# Patient Record
Sex: Female | Born: 1980 | Race: White | Hispanic: No | Marital: Single | Smoking: Never smoker
Health system: Southern US, Community
[De-identification: ages and names within clinical notes are randomized; demographics above are authoritative.]

## PROBLEM LIST (undated history)

## (undated) DIAGNOSIS — J3089 Other allergic rhinitis: Secondary | ICD-10-CM

## (undated) DIAGNOSIS — J00 Acute nasopharyngitis [common cold]: Secondary | ICD-10-CM

## (undated) DIAGNOSIS — Z8489 Family history of other specified conditions: Secondary | ICD-10-CM

## (undated) DIAGNOSIS — Z87410 Personal history of cervical dysplasia: Secondary | ICD-10-CM

## (undated) DIAGNOSIS — F419 Anxiety disorder, unspecified: Secondary | ICD-10-CM

## (undated) DIAGNOSIS — N943 Premenstrual tension syndrome: Secondary | ICD-10-CM

## (undated) DIAGNOSIS — Z87898 Personal history of other specified conditions: Secondary | ICD-10-CM

## (undated) DIAGNOSIS — Z8742 Personal history of other diseases of the female genital tract: Secondary | ICD-10-CM

## (undated) DIAGNOSIS — N840 Polyp of corpus uteri: Secondary | ICD-10-CM

## (undated) DIAGNOSIS — G43909 Migraine, unspecified, not intractable, without status migrainosus: Secondary | ICD-10-CM

## (undated) HISTORY — DX: Migraine, unspecified, not intractable, without status migrainosus: G43.909

## (undated) HISTORY — DX: Premenstrual tension syndrome: N94.3

## (undated) HISTORY — PX: AUGMENTATION MAMMAPLASTY: SUR837

## (undated) HISTORY — PX: WISDOM TOOTH EXTRACTION: SHX21

## (undated) HISTORY — PX: HAMMER TOE SURGERY: SHX385

---

## 1999-04-02 ENCOUNTER — Other Ambulatory Visit: Admission: RE | Admit: 1999-04-02 | Discharge: 1999-04-02 | Payer: Self-pay | Admitting: *Deleted

## 2000-04-03 ENCOUNTER — Other Ambulatory Visit: Admission: RE | Admit: 2000-04-03 | Discharge: 2000-04-03 | Payer: Self-pay | Admitting: *Deleted

## 2001-04-06 ENCOUNTER — Other Ambulatory Visit: Admission: RE | Admit: 2001-04-06 | Discharge: 2001-04-06 | Payer: Self-pay | Admitting: Obstetrics and Gynecology

## 2002-01-14 ENCOUNTER — Other Ambulatory Visit: Admission: RE | Admit: 2002-01-14 | Discharge: 2002-01-14 | Payer: Self-pay | Admitting: Obstetrics and Gynecology

## 2003-02-02 ENCOUNTER — Other Ambulatory Visit: Admission: RE | Admit: 2003-02-02 | Discharge: 2003-02-02 | Payer: Self-pay | Admitting: Obstetrics and Gynecology

## 2004-02-28 ENCOUNTER — Other Ambulatory Visit: Admission: RE | Admit: 2004-02-28 | Discharge: 2004-02-28 | Payer: Self-pay | Admitting: Obstetrics and Gynecology

## 2004-05-10 DIAGNOSIS — Z87898 Personal history of other specified conditions: Secondary | ICD-10-CM

## 2004-05-10 HISTORY — DX: Personal history of other specified conditions: Z87.898

## 2005-02-28 ENCOUNTER — Other Ambulatory Visit: Admission: RE | Admit: 2005-02-28 | Discharge: 2005-02-28 | Payer: Self-pay | Admitting: Obstetrics and Gynecology

## 2006-03-20 ENCOUNTER — Other Ambulatory Visit: Admission: RE | Admit: 2006-03-20 | Discharge: 2006-03-20 | Payer: Self-pay | Admitting: Obstetrics and Gynecology

## 2006-06-01 ENCOUNTER — Ambulatory Visit: Payer: Self-pay | Admitting: Internal Medicine

## 2006-06-08 ENCOUNTER — Ambulatory Visit: Payer: Self-pay | Admitting: Internal Medicine

## 2007-09-01 ENCOUNTER — Other Ambulatory Visit: Admission: RE | Admit: 2007-09-01 | Discharge: 2007-09-01 | Payer: Self-pay | Admitting: Obstetrics and Gynecology

## 2007-12-27 ENCOUNTER — Ambulatory Visit: Payer: Self-pay | Admitting: Internal Medicine

## 2007-12-27 LAB — CONVERTED CEMR LAB
Glucose, Urine, Semiquant: NEGATIVE
Ketones, urine, test strip: NEGATIVE
Specific Gravity, Urine: >=1.03
pH: 5.5

## 2007-12-29 LAB — CONVERTED CEMR LAB
Albumin: 3.9 g/dL (ref 3.5–5.2)
Basophils Absolute: 0 10*3/uL (ref 0.0–0.1)
Cholesterol: 177 mg/dL (ref 0–200)
Creatinine, Ser: 1.1 mg/dL (ref 0.4–1.2)
GFR calc Af Amer: 77 mL/min
HCT: 40.3 % (ref 36.0–46.0)
HDL: 60.3 mg/dL (ref >39.0)
Hemoglobin: 13.7 g/dL (ref 12.0–15.0)
LDL Cholesterol: 100 mg/dL — ABNORMAL HIGH (ref 0–99)
Lymphocytes Relative: 44.5 % (ref 12.0–46.0)
MCHC: 34.1 g/dL (ref 30.0–36.0)
MCV: 94.6 fL (ref 78.0–100.0)
Monocytes Absolute: 0.5 10*3/uL (ref 0.2–0.7)
Monocytes Relative: 6.7 % (ref 3.0–11.0)
Neutro Abs: 3.4 10*3/uL (ref 1.4–7.7)
Neutrophils Relative %: 46.1 % (ref 43.0–77.0)
Potassium: 3.8 meq/L (ref 3.5–5.1)
RDW: 12 % (ref 11.5–14.6)
Sodium: 138 meq/L (ref 135–145)
TSH: 1.5 microintl units/mL (ref 0.35–5.50)
Total Bilirubin: 1 mg/dL (ref 0.3–1.2)
Total Protein: 6.7 g/dL (ref 6.0–8.3)

## 2007-12-31 ENCOUNTER — Ambulatory Visit: Payer: Self-pay | Admitting: Internal Medicine

## 2007-12-31 DIAGNOSIS — K6289 Other specified diseases of anus and rectum: Secondary | ICD-10-CM

## 2007-12-31 DIAGNOSIS — J309 Allergic rhinitis, unspecified: Secondary | ICD-10-CM | POA: Insufficient documentation

## 2008-09-06 ENCOUNTER — Other Ambulatory Visit: Admission: RE | Admit: 2008-09-06 | Discharge: 2008-09-06 | Payer: Self-pay | Admitting: Obstetrics and Gynecology

## 2009-12-11 ENCOUNTER — Telehealth: Payer: Self-pay | Admitting: Internal Medicine

## 2009-12-12 ENCOUNTER — Ambulatory Visit: Payer: Self-pay | Admitting: Internal Medicine

## 2009-12-12 LAB — CONVERTED CEMR LAB
ALT: 18 units/L (ref 0–35)
Basophils Absolute: 0 10*3/uL (ref 0.0–0.1)
Bilirubin Urine: NEGATIVE
CO2: 27 meq/L (ref 19–32)
Calcium: 9.2 mg/dL (ref 8.4–10.5)
Eosinophils Absolute: 0.2 10*3/uL (ref 0.0–0.7)
GFR calc non Af Amer: 69.24 mL/min (ref >60)
HCT: 39.5 % (ref 36.0–46.0)
HDL: 70.2 mg/dL (ref >39.00)
Leukocytes, UA: NEGATIVE
Lymphs Abs: 2.3 10*3/uL (ref 0.7–4.0)
MCV: 97 fL (ref 78.0–100.0)
Monocytes Absolute: 0.4 10*3/uL (ref 0.1–1.0)
Nitrite: NEGATIVE
Platelets: 201 10*3/uL (ref 150.0–400.0)
Potassium: 4.2 meq/L (ref 3.5–5.1)
RDW: 11.4 % — ABNORMAL LOW (ref 11.5–14.6)
Sodium: 138 meq/L (ref 135–145)
Specific Gravity, Urine: 1.025 (ref 1.000–1.030)
TSH: 1.1 microintl units/mL (ref 0.35–5.50)
Total Bilirubin: 0.9 mg/dL (ref 0.3–1.2)
Triglycerides: 86 mg/dL (ref 0.0–149.0)
pH: 6 (ref 5.0–8.0)

## 2009-12-17 ENCOUNTER — Ambulatory Visit: Payer: Self-pay | Admitting: Internal Medicine

## 2009-12-17 DIAGNOSIS — J069 Acute upper respiratory infection, unspecified: Secondary | ICD-10-CM | POA: Insufficient documentation

## 2009-12-21 ENCOUNTER — Ambulatory Visit: Payer: Self-pay | Admitting: Internal Medicine

## 2009-12-24 ENCOUNTER — Ambulatory Visit: Payer: Self-pay | Admitting: Internal Medicine

## 2010-01-08 HISTORY — PX: COLPOSCOPY: SHX161

## 2010-01-14 ENCOUNTER — Ambulatory Visit: Payer: Self-pay | Admitting: Internal Medicine

## 2010-06-14 ENCOUNTER — Ambulatory Visit: Payer: Self-pay | Admitting: Internal Medicine

## 2010-12-10 NOTE — Assessment & Plan Note (Signed)
Summary: hep A AND B COMBO/NJR  Nurse Visit   Allergies: 1)  ! * Shellfish  Immunizations Administered:  TwinRix # 2:    Vaccine Type: TwinRix    Site: right deltoid    Mfr: GlaxoSmithKline    Dose: 1.0 ml    Route: IM    Given by: Romualdo Bolk, CMA (AAMA)    Exp. Date: 08/25/2011    Lot #: [id number]  Orders Added: 1)  TwinRix 1ml ( Hep A&B Adult dose) [90636] 2)  Admin 1st Vaccine [90471]

## 2010-12-10 NOTE — Assessment & Plan Note (Signed)
Summary: tb reading/mm  Nurse Visit   Allergies: 1)  ! * Shellfish  PPD Results    Date of reading: 12/24/2009    Results: < 5mm    Interpretation: negative

## 2010-12-10 NOTE — Assessment & Plan Note (Signed)
Summary: final hep a/b//ccm  Nurse Visit   Allergies: 1)  ! * Shellfish  Immunizations Administered:  TwinRix # 3:    Vaccine Type: TwinRix    Site: right deltoid    Mfr: GlaxoSmithKline    Dose: 1.0 ml    Route: IM    Given by: Romualdo Bolk, CMA (AAMA)    Exp. Date: 08/16/2012    Lot #: [id number]  Orders Added: 1)  TwinRix 1ml ( Hep A&B Adult dose) [90636] 2)  Admin 1st Vaccine [90471]

## 2010-12-10 NOTE — Assessment & Plan Note (Signed)
Summary: ppd only/ssc  Nurse Visit   Allergies: 1)  ! * Shellfish  Immunizations Administered:  PPD Skin Test:    Vaccine Type: PPD    Site: right forearm    Mfr: Sanofi Pasteur    Dose: 0.1 ml    Route: ID    Given by: Romualdo Bolk, CMA (AAMA)    Exp. Date: 04/06/2012    Lot #: [id number]  Orders Added: 1)  TB Skin Test [86580] 2)  Admin 1st Vaccine [phone]
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
## 2010-12-10 NOTE — Progress Notes (Signed)
Summary: REQ FOR LABS  Phone Note Call from Patient   Caller: Patient Reason for Call: Talk to Doctor Summary of Call: Pt called to schedule appt for cpx w/ Dr Fabian Sharp on 12/17/2009 @ 10am.... Pt will go for labs at [facility] on 12/12/2009.... However, pt is req to have her Vitamin D level ck at time of her cpx labs.... Can you advise order for vit d ck to be added to cpx labs?  Initial call taken by: Debbra Riding,  December 11, 2009 12:25 PM  Follow-up for Phone Call        Per Dr. Fabian Sharp- can do but Ins may not pay for it. Follow-up by: Romualdo Bolk, CMA Duncan Dull),  December 11, 2009 5:23 PM  Additional Follow-up for Phone Call Additional follow up Details #1::        Phone Call Completed----Pt was called on [phone] and adv of same. Additional Follow-up by: Debbra Riding,  December 12, 2009 8:03 AM

## 2011-03-28 NOTE — Assessment & Plan Note (Signed)
[facility] OFFICE NOTE   [name], [name]                     MRN:          [id number]  DATE:06/08/2006                            DOB:          1981-08-09    CHIEF COMPLAINT:  New patient checkup.   HISTORY OF PRESENT ILLNESS:  [name] is a 30 year old, nonsmoking,  single white female who comes in today for the above reason.  She was seen  in our practice quite awhile ago and is reestablishing with a primary care  physician.  She does see OB/GYN Dr. Meredeth Ide and had a checkup in May  2007. She is generally well but does have allergic symptoms such as allergic  rhinitis, occasional headaches related to that and is on allergy shots for  this.  She sees Dr. Stevphen Rochester.  She has also had some GERD symptoms for  which she has taken vinegar b.i.d.  No other treatment, prescription or  otherwise.  Notes it more after eating three to five days a week.  No  nocturnal symptoms, vomiting, weight loss, or other reflux symptoms.   PAST MEDICAL HISTORY:  See data base.  1.  Chicken pox has a child.  2.  History of anemia.  3.  Seasonal rhinitis.  4.  GERD as above.  5.  Hospitalizations:  None.  6.  Last Pap smear was May 2007.  7.  LMP June 06, 2006.  8.  She thinks her tetanus shot is up to date because she was at nursing      school. Cannot give the dates.   MEDICATIONS:  1.  Zyrtec 10 mg a day.  2.  __________  28 one p.o. daily.  3.  Allergy shots.   ALLERGIES:  She is allergic to a number of things and mildly to shellfish.  No known medication allergies.   FAMILY HISTORY:  Father with type 2 diabetes as well as paternal aunt.  Father also has hypertension and hyperlipidemia.  There is also arthritis in  the family.  See data base.   SOCIAL HISTORY:  Employed as a Designer, television/film set.  BS degree.  __________  .  Eight hours of sleep a night.  Social alcohol up to six beverages a  week.  No tobacco.  Tries to do regular  exercises but not as much recently.  Caffeine: Three diet sodas a day or  such.   REVIEW OF SYSTEMS:  Negative for chest pain, shortness of breath.  She does  have some problems with headaches premenstrually and takes four Advil for  this but not on a regular basis.  The rest are review of systems is negative  or noncontributory.   PHYSICAL EXAMINATION:  VITAL SIGNS:  Height 5 feet 8 inches, weight 171.  Pulse 72 and regular, blood pressure 100/77.  GENERAL:  Well-nourished, healthy-appearing, young adult in no acute  distress.  HEENT:  Normocephalic.  Tympanic membrane clear.  Eyes: PERRL.  Sclerae  nonicteric.  She is wearing contacts.  Oropharynx clear.  NECK:  Supple without masses, thyromegaly or bruits.  CHEST:  Clear to auscultation.Marland Kitchen  CARDIAC:  S1 and S2.  No gallops, murmurs.  Peripheral pulses present.  __________ .  negative CPE.  LYMPH NODES:  Negative axilla, groin and cervix.  ABDOMEN:  Soft.  No organomegaly, guarding or rebound.  EXTREMITIES:  Good range of motion.  BACK:  Without scoliosis.  NEUROLOGIC:  Grossly intact.   LABORATORY DATA:  Normal CBC with a slight flip of leukocytes and  neutrophils 41/48.  Lipids normal.  Total 163.  CMP normal.  Fasting blood  sugar 88, creatinine 1, TSH 0.97.  Urinalysis is clear.   IMPRESSION:  1.  __________  healthy.  2.  Gastroesophageal reflux disease, somewhat more symptomatic than      occasional.  Counseled and discussed lifestyle changes and decreasing      caffeine.  Add on ranitidine 150 one p.o. b.i.d. #60, refill x6 and      __________  in about one month.  If still problematic, may consider      proton pump inhibitor. She may be able to control this with lifestyle      and weight loss of 35 pounds or such.   Also followup p.r.n. for other issues as needed.                                   Neta Mends. Fabian Sharp, MD   WKP/MedQ  DD:  06/08/2006  DT:  06/09/2006  Job #:   [id number]

## 2011-10-22 ENCOUNTER — Ambulatory Visit (INDEPENDENT_AMBULATORY_CARE_PROVIDER_SITE_OTHER): Payer: BC Managed Care – PPO

## 2011-10-22 DIAGNOSIS — R05 Cough: Secondary | ICD-10-CM

## 2011-10-22 DIAGNOSIS — J111 Influenza due to unidentified influenza virus with other respiratory manifestations: Secondary | ICD-10-CM

## 2012-01-09 HISTORY — PX: COLPOSCOPY: SHX161

## 2013-03-23 ENCOUNTER — Other Ambulatory Visit: Payer: Self-pay | Admitting: Certified Nurse Midwife

## 2013-03-28 ENCOUNTER — Encounter: Payer: Self-pay | Admitting: Obstetrics and Gynecology

## 2013-03-28 ENCOUNTER — Encounter: Payer: Self-pay | Admitting: Internal Medicine

## 2013-03-29 ENCOUNTER — Ambulatory Visit (INDEPENDENT_AMBULATORY_CARE_PROVIDER_SITE_OTHER): Payer: BC Managed Care – PPO | Admitting: Obstetrics and Gynecology

## 2013-03-29 ENCOUNTER — Encounter: Payer: Self-pay | Admitting: Obstetrics and Gynecology

## 2013-03-29 VITALS — BP 120/78 | Ht 68.0 in | Wt 177.0 lb

## 2013-03-29 DIAGNOSIS — Z01419 Encounter for gynecological examination (general) (routine) without abnormal findings: Secondary | ICD-10-CM

## 2013-03-29 DIAGNOSIS — Z0000 Encounter for general adult medical examination without abnormal findings: Secondary | ICD-10-CM

## 2013-03-29 LAB — POCT URINALYSIS DIPSTICK
Leukocytes, UA: NEGATIVE
Nitrite, UA: NEGATIVE
Protein, UA: NEGATIVE
Urobilinogen, UA: NEGATIVE
pH, UA: 7

## 2013-03-29 MED ORDER — LEVONORGESTREL-ETHINYL ESTRAD 0.1-20 MG-MCG PO TABS: ORAL_TABLET | ORAL | Status: DC
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
## 2013-03-29 NOTE — Patient Instructions (Signed)

## 2013-03-30 LAB — HEMOGLOBIN, FINGERSTICK: Hemoglobin, fingerstick: 15.4 g/dL (ref 12.0–16.0)

## 2013-06-10 HISTORY — PX: BREAST ENHANCEMENT SURGERY: SHX7

## 2014-01-30 ENCOUNTER — Ambulatory Visit (INDEPENDENT_AMBULATORY_CARE_PROVIDER_SITE_OTHER): Payer: BC Managed Care – PPO | Admitting: Physician Assistant

## 2014-01-30 VITALS — BP 116/72 | HR 73 | Temp 97.9°F | Resp 16 | Ht 68.5 in | Wt 186.0 lb

## 2014-01-30 DIAGNOSIS — J309 Allergic rhinitis, unspecified: Secondary | ICD-10-CM

## 2014-01-30 DIAGNOSIS — J302 Other seasonal allergic rhinitis: Secondary | ICD-10-CM

## 2014-01-30 MED ORDER — METHYLPREDNISOLONE ACETATE 80 MG/ML IJ SUSP
80.0000 mg | Freq: Once | INTRAMUSCULAR | Status: AC
  Administered 2014-01-30: 80 mg via INTRAMUSCULAR

## 2014-01-30 MED ORDER — MOMETASONE FUROATE 50 MCG/ACT NA SUSP: 2.0000 | Freq: Every day | NASAL | Status: DC

## 2014-01-30 NOTE — Progress Notes (Signed)
   Subjective:    Patient ID: [name], female    DOB: Nov 09, 1981, 33 y.o.   MRN: [id number]  HPI Pt presents to clinic with allergies that flared over the weekend and now she cannot breath out of her nose.  She has h/o allergy injections but has not had them for years.  She has used a few doses of OTC antihistamines and afrin since her symptoms started.  She is worried because she is leaving for [facility] for the weekend and going to fly and wants to be able to breath.  This am she cough and her chest felt tight but that was only once and she has no h/o asthma.  She does not feel like this is a cold.  Review of Systems  Constitutional: Negative for fever and chills.  HENT: Positive for congestion and rhinorrhea (clear).   Respiratory: Positive for cough.        Objective:   Physical Exam  Vitals reviewed. Constitutional: She is oriented to person, place, and time. She appears well-developed and well-nourished.  HENT:  Head: Normocephalic and atraumatic.  Right Ear: Hearing, tympanic membrane, external ear and ear canal normal.  Left Ear: Hearing, tympanic membrane, external ear and ear canal normal.  Nose: Mucosal edema (pale and turbinates touching septum on the left side) present.  Mouth/Throat: Uvula is midline, oropharynx is clear and moist and mucous membranes are normal.  Very congested  Eyes: Conjunctivae are normal.  Neck: Normal range of motion.  Cardiovascular: Normal rate, regular rhythm and normal heart sounds.   No murmur heard. Pulmonary/Chest: Effort normal and breath sounds normal. She has no wheezes.  Neurological: She is alert and oriented to person, place, and time.  Skin: Skin is warm and dry.  Psychiatric: She has a normal mood and affect. Her behavior is normal. Judgment and thought content normal.       Assessment & Plan:  Seasonal allergies - Plan: methylPREDNISolone acetate (DEPO-MEDROL) injection 80 mg, mometasone (NASONEX) 50 MCG/ACT nasal spray   Pt to continue OTC antihistamines.  Use Afrin only a few days to prevent rebound congestion.  Windell Hummingbird PA-C  [facility] 01/30/2014 7:42 PM

## 2014-04-05 ENCOUNTER — Ambulatory Visit: Payer: BC Managed Care – PPO | Admitting: Obstetrics and Gynecology

## 2014-04-07 ENCOUNTER — Encounter: Payer: Self-pay | Admitting: Obstetrics & Gynecology

## 2014-04-07 ENCOUNTER — Ambulatory Visit (INDEPENDENT_AMBULATORY_CARE_PROVIDER_SITE_OTHER): Payer: BC Managed Care – PPO | Admitting: Obstetrics & Gynecology

## 2014-04-07 VITALS — BP 108/68 | HR 64 | Resp 18 | Ht 68.5 in | Wt 178.0 lb

## 2014-04-07 DIAGNOSIS — Z01419 Encounter for gynecological examination (general) (routine) without abnormal findings: Secondary | ICD-10-CM

## 2014-04-07 DIAGNOSIS — N9489 Other specified conditions associated with female genital organs and menstrual cycle: Secondary | ICD-10-CM

## 2014-04-07 DIAGNOSIS — N907 Vulvar cyst: Secondary | ICD-10-CM

## 2014-04-07 DIAGNOSIS — N870 Mild cervical dysplasia: Secondary | ICD-10-CM

## 2014-04-07 DIAGNOSIS — Z124 Encounter for screening for malignant neoplasm of cervix: Secondary | ICD-10-CM

## 2014-04-07 MED ORDER — LEVONORGESTREL-ETHINYL ESTRAD 0.1-20 MG-MCG PO TABS: ORAL_TABLET | ORAL | Status: DC
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
## 2014-04-11 LAB — IPS PAP TEST WITH HPV

## 2014-04-19 ENCOUNTER — Telehealth: Payer: Self-pay | Admitting: Obstetrics & Gynecology

## 2014-04-19 NOTE — Telephone Encounter (Signed)
Spoke with patient. Results given as seen below. Patient states "Now I do not have to come back for two years right?" Advised patient that we recommend AEX every year but may not have PAP done every year depending on previous results and doctor protocol. Patient would like to verify with Dr.Miller when she needs to be seen again. "Dr.Miller said that I would not need to be seen for two years." Advised patient would send a message to [place] and give patient a call back with further recommendations. Patient agreeable.   Notes Recorded by Maryann Conners, CMA on 04/17/2014 at 10:29 AM Patient has AEX scheduled for 2016//kn Notes Recorded by Lyman Speller, MD on 04/12/2014 at 8:13 AM 02 recall.. H/o CIN 2. Two normal paps with neg HR HPV.

## 2014-04-19 NOTE — Telephone Encounter (Signed)
She should be seen in a year.  She doesn't need a Pap next year.  Also, she is on OCPs and gets a yearly RX so she needs to come back.  Her appt was scheduled already.  You are correct in your recommendations to her.

## 2014-04-19 NOTE — Telephone Encounter (Signed)
Patient is calling for recent pap results. °

## 2014-04-19 NOTE — Telephone Encounter (Signed)
Detailed message left at number provided [phone]. Okay per ROI. Advised of message from [place] as seen below. Advised we already have her scheduled for next year and if any further questions to give [place] a call back at [phone].  Routing to provider for final review. Patient agreeable to disposition. Will close encounter

## 2014-06-19 ENCOUNTER — Telehealth: Payer: Self-pay | Admitting: Obstetrics & Gynecology

## 2014-06-19 NOTE — Telephone Encounter (Signed)
Patient calling to speak with nurse with questions about her birth control.

## 2014-06-19 NOTE — Telephone Encounter (Signed)
Spoke with patient. Patient states that she has been taking the generic for orsythia. "Since taking this generic I have been having more headaches, heavier and longer periods, and more cramping. I know sometimes it can be related to the generic. Can I get this medication written as a brand?" Advised patient that there is a brand rx for this medication. Patient is requesting to switch to brand only with three months at a time. Advised would send a message over to [place] to let her know of request. Patient is using [facility] on file.  Dr.Miller, okay to order orsythia as brand only for this patient?

## 2014-06-20 MED ORDER — LEVONORGESTREL-ETHINYL ESTRAD 0.1-20 MG-MCG PO TABS: 1.0000 | ORAL_TABLET | Freq: Every day | ORAL | Status: DC

## 2014-06-20 NOTE — Telephone Encounter (Signed)
Rx done for Alesse or any brand pill that is the same.  Rx done for 3 months with RFs.

## 2014-06-20 NOTE — Telephone Encounter (Signed)
Spoke with patient. Advised rx sent in. Patient agreeable.  Routing to provider for final review. Patient agreeable to disposition. Will close encounter

## 2014-12-19 ENCOUNTER — Ambulatory Visit (INDEPENDENT_AMBULATORY_CARE_PROVIDER_SITE_OTHER): Payer: BLUE CROSS/BLUE SHIELD | Admitting: Physician Assistant

## 2014-12-19 VITALS — BP 122/80 | HR 78 | Temp 98.1°F | Resp 16 | Ht 68.5 in | Wt 182.0 lb

## 2014-12-19 DIAGNOSIS — B9789 Other viral agents as the cause of diseases classified elsewhere: Principal | ICD-10-CM

## 2014-12-19 DIAGNOSIS — J069 Acute upper respiratory infection, unspecified: Secondary | ICD-10-CM

## 2014-12-19 MED ORDER — IPRATROPIUM BROMIDE 0.06 % NA SOLN: 2.0000 | Freq: Three times a day (TID) | NASAL | Status: DC

## 2014-12-19 MED ORDER — AMOXICILLIN 875 MG PO TABS: 875.0000 mg | ORAL_TABLET | Freq: Two times a day (BID) | ORAL | Status: DC

## 2014-12-19 MED ORDER — MOMETASONE FUROATE 50 MCG/ACT NA SUSP: 2.0000 | Freq: Every day | NASAL | Status: DC

## 2014-12-19 MED ORDER — HYDROCOD POLST-CHLORPHEN POLST 10-8 MG/5ML PO LQCR: 5.0000 mL | Freq: Two times a day (BID) | ORAL | Status: DC | PRN

## 2014-12-19 NOTE — Progress Notes (Signed)
   Subjective:    Patient ID: [name], female    DOB: Jul 03, 1981, 34 y.o.   MRN: [id number]  HPI Pt presents to clinic with cold symptoms for the last 3 days - she started with sore throat and congestion and she has started taking Mucinex she is having more symptoms.  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    OTC meds - dayquil/nyquil/mucinex  Review of Systems  Constitutional: Positive for fever (subjective). Negative for chills.  HENT: Positive for congestion, dental problem (upper teeth pain), postnasal drip, rhinorrhea (clear) and sore throat (worse in the am).   Respiratory: Positive for cough (green this am - otherwise clear). Negative for shortness of breath and wheezing.        No h/o asthma, nonsmoker  Neurological: Positive for headaches.       Objective:   Physical Exam  Constitutional: She is oriented to person, place, and time. She appears well-developed and well-nourished.  BP 122/80 mmHg  Pulse 78  Temp(Src) 98.1 F (36.7 C) (Oral)  Resp 16  Ht 5' 8.5" (1.74 m)  Wt 182 lb (82.555 kg)  BMI 27.27 kg/m2  SpO2 97%  LMP 11/21/2014   HENT:  Head: Normocephalic and atraumatic.  Right Ear: Hearing, tympanic membrane, external ear and ear canal normal.  Left Ear: Hearing, tympanic membrane, external ear and ear canal normal.  Nose: Mucosal edema (red) present.  Mouth/Throat: Uvula is midline, oropharynx is clear and moist and mucous membranes are normal.  Eyes: Conjunctivae are normal.  Neck: Normal range of motion.  Cardiovascular: Normal rate, regular rhythm and normal heart sounds.   No murmur heard. Pulmonary/Chest: Effort normal and breath sounds normal.  Lymphadenopathy:    She has no cervical adenopathy.  Neurological: She is alert and oriented to person, place, and time.  Skin: Skin is warm and dry.  Psychiatric: She has a normal mood and affect. Her behavior is normal. Judgment and thought content normal.        Assessment & Plan:  Viral URI with cough - Plan: mometasone (NASONEX) 50 MCG/ACT nasal spray, ipratropium (ATROVENT) 0.06 % nasal spray, chlorpheniramine-HYDROcodone (TUSSIONEX PENNKINETIC ER) 10-8 MG/5ML LQCR, amoxicillin (AMOXIL) 875 MG tablet  Windell Hummingbird PA-C  [facility] 12/19/2014 5:30 PM

## 2015-04-16 ENCOUNTER — Encounter: Payer: Self-pay | Admitting: Obstetrics & Gynecology

## 2015-04-16 ENCOUNTER — Ambulatory Visit (INDEPENDENT_AMBULATORY_CARE_PROVIDER_SITE_OTHER): Payer: BLUE CROSS/BLUE SHIELD | Admitting: Obstetrics & Gynecology

## 2015-04-16 VITALS — BP 116/72 | HR 80 | Resp 16 | Ht 68.5 in | Wt 182.4 lb

## 2015-04-16 DIAGNOSIS — Z124 Encounter for screening for malignant neoplasm of cervix: Secondary | ICD-10-CM | POA: Diagnosis not present

## 2015-04-16 DIAGNOSIS — Z01419 Encounter for gynecological examination (general) (routine) without abnormal findings: Secondary | ICD-10-CM

## 2015-04-16 MED ORDER — LEVONORGESTREL-ETHINYL ESTRAD 0.1-20 MG-MCG PO TABS: 1.0000 | ORAL_TABLET | Freq: Every day | ORAL | Status: DC

## 2015-04-16 MED ORDER — SUMATRIPTAN SUCCINATE 100 MG PO TABS: 100.0000 mg | ORAL_TABLET | ORAL | Status: DC | PRN

## 2015-04-16 NOTE — Addendum Note (Signed)
Addended by: Megan Salon on: 04/16/2015 05:25 PM   Modules accepted: Miquel Dunn
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
## 2015-04-19 LAB — IPS PAP TEST WITH REFLEX TO HPV

## 2015-09-04 ENCOUNTER — Other Ambulatory Visit: Payer: Self-pay | Admitting: Obstetrics & Gynecology

## 2016-07-10 ENCOUNTER — Encounter: Payer: Self-pay | Admitting: Obstetrics & Gynecology

## 2016-07-10 ENCOUNTER — Ambulatory Visit (INDEPENDENT_AMBULATORY_CARE_PROVIDER_SITE_OTHER): Payer: Managed Care, Other (non HMO) | Admitting: Obstetrics & Gynecology

## 2016-07-10 VITALS — BP 98/60 | HR 68 | Resp 16 | Ht 68.5 in | Wt 153.0 lb

## 2016-07-10 DIAGNOSIS — Z01419 Encounter for gynecological examination (general) (routine) without abnormal findings: Secondary | ICD-10-CM

## 2016-07-10 MED ORDER — SUMATRIPTAN SUCCINATE 100 MG PO TABS: 100.0000 mg | ORAL_TABLET | ORAL | 1 refills | Status: DC | PRN
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
## 2016-08-26 ENCOUNTER — Ambulatory Visit (INDEPENDENT_AMBULATORY_CARE_PROVIDER_SITE_OTHER): Payer: Managed Care, Other (non HMO) | Admitting: Obstetrics & Gynecology

## 2016-08-26 ENCOUNTER — Encounter: Payer: Self-pay | Admitting: Obstetrics & Gynecology

## 2016-08-26 VITALS — BP 110/70 | HR 70 | Resp 14 | Ht 68.0 in | Wt 157.2 lb

## 2016-08-26 DIAGNOSIS — N898 Other specified noninflammatory disorders of vagina: Secondary | ICD-10-CM | POA: Diagnosis not present

## 2016-08-26 MED ORDER — CLOBETASOL PROPIONATE 0.05 % EX OINT
1.0000 | TOPICAL_OINTMENT | Freq: Two times a day (BID) | CUTANEOUS | 0 refills | Status: DC
Start: 2016-08-26 — End: 2017-11-19

## 2016-08-26 MED ORDER — FLUCONAZOLE 150 MG PO TABS: 150.0000 mg | ORAL_TABLET | Freq: Once | ORAL | 0 refills | Status: AC
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
## 2016-08-27 LAB — WET PREP BY MOLECULAR PROBE
Candida species: NEGATIVE
Gardnerella vaginalis: NEGATIVE
Trichomonas vaginosis: NEGATIVE

## 2016-08-27 LAB — GC/CHLAMYDIA PROBE AMP
CT Probe RNA: NOT DETECTED
GC Probe RNA: NOT DETECTED

## 2016-12-31 ENCOUNTER — Ambulatory Visit (INDEPENDENT_AMBULATORY_CARE_PROVIDER_SITE_OTHER): Payer: 59 | Admitting: Physician Assistant

## 2016-12-31 VITALS — BP 110/70 | HR 78 | Temp 97.5°F | Resp 18 | Ht 68.0 in | Wt 161.6 lb

## 2016-12-31 DIAGNOSIS — R05 Cough: Secondary | ICD-10-CM | POA: Diagnosis not present

## 2016-12-31 DIAGNOSIS — R059 Cough, unspecified: Secondary | ICD-10-CM

## 2016-12-31 MED ORDER — HYDROCOD POLST-CPM POLST ER 10-8 MG/5ML PO SUER: 5.0000 mL | Freq: Two times a day (BID) | ORAL | 0 refills | Status: DC | PRN

## 2016-12-31 MED ORDER — DOXYCYCLINE HYCLATE 100 MG PO TABS: 100.0000 mg | ORAL_TABLET | Freq: Two times a day (BID) | ORAL | 0 refills | Status: DC

## 2016-12-31 MED ORDER — ALBUTEROL SULFATE HFA 108 (90 BASE) MCG/ACT IN AERS: 2.0000 | INHALATION_SPRAY | Freq: Four times a day (QID) | RESPIRATORY_TRACT | 0 refills | Status: DC | PRN

## 2016-12-31 NOTE — Progress Notes (Signed)
[name]  MRN: [id number] DOB: 10-Mar-1981  Subjective:  Pt presents to clinic with 6 day h/o cold symptoms.  Low grade fever this am and sinus pressure but the worse is the cough.  Chest discomfort when she coughs but no SOB or wheezing.   She has some nasal congestion and sputum production but it is minimal.  She has been using mucinex DM since the start of her symptoms. Started with symptoms 2/15 Started Tamiflu 2/19 Flu vaccine this year.  Review of Systems  Constitutional: Positive for fever (low grade).  HENT: Positive for congestion.   Respiratory: Positive for cough. Negative for chest tightness, shortness of breath and wheezing.     Patient Active Problem List   Diagnosis Date Noted  . ALLERGIC RHINITIS 12/31/2007    Current Outpatient Prescriptions on File Prior to Visit  Medication Sig Dispense Refill  . Ascorbic Acid (VITAMIN C PO) Take by mouth as needed.     . Cholecalciferol (VITAMIN D PO) Take 2,000 Int'l Units by mouth daily.    . clobetasol ointment (TEMOVATE) [id number] % Apply 1 application topically 2 (two) times daily. Apply as directed twice daily.  Do not use longer than 10 days. 45 g 0  . mometasone (NASONEX) 50 MCG/ACT nasal spray Place 2 sprays into the nose daily. 17 g 12  . QSYMIA 7.5-46 MG CP24     . SUMAtriptan (IMITREX) 100 MG tablet Take 1 tablet (100 mg total) by mouth every 2 (two) hours as needed for migraine. May dose 200mg /24 hrs 9 tablet 1  . VASCEPA 1 g CAPS      No current facility-administered medications on file prior to visit.     No Known Allergies  Pt patients past, family and social history were reviewed and updated.   Objective:  BP 110/70 (BP Location: Right Arm, Patient Position: Sitting, Cuff Size: Small)   Pulse 78   Temp 97.5 F (36.4 C) (Oral)   Resp 18   Ht 5\' 8"  (1.727 m)   Wt 161 lb 9.6 oz (73.3 kg)   LMP 12/11/2016 (Approximate)   SpO2 97%   BMI 24.57 kg/m   Physical Exam  Constitutional: She is oriented  to person, place, and time and well-developed, well-nourished, and in no distress.  HENT:  Head: Normocephalic and atraumatic.  Right Ear: Hearing, tympanic membrane, external ear and ear canal normal.  Left Ear: Hearing, tympanic membrane, external ear and ear canal normal.  Nose: Nose normal.  Mouth/Throat: Uvula is midline, oropharynx is clear and moist and mucous membranes are normal.  Eyes: Conjunctivae are normal.  Neck: Normal range of motion.  Cardiovascular: Normal rate, regular rhythm and normal heart sounds.   No murmur heard. Pulmonary/Chest: Effort normal and breath sounds normal. She has no wheezes.  Neurological: She is alert and oriented to person, place, and time. Gait normal.  Skin: Skin is warm and dry.  Psychiatric: Mood, memory, affect and judgment normal.  Vitals reviewed.   Assessment and Plan :  Cough - Plan: albuterol (PROVENTIL HFA;VENTOLIN HFA) 108 (90 Base) MCG/ACT inhaler, chlorpheniramine-HYDROcodone (TUSSIONEX PENNKINETIC ER) 10-8 MG/5ML SUER, doxycycline (VIBRA-TABS) 100 MG tablet   Pt to continue tamiflu and mucinex DM.  She will continue to push fluids - due to the fact that I think this is unlikely the flu and another virus the fact that she is getting worse we will be cautious and I have sent the patient home with a rx for doxy that she will  start if she does not improve in the next 2 days.  Emily Hummingbird PA-C  [facility] 12/31/2016 9:13 PM

## 2016-12-31 NOTE — Patient Instructions (Signed)
     IF you received an x-ray today, you will receive an invoice from [facility]. Please contact [facility] at [phone] with questions or concerns regarding your invoice.   IF you received labwork today, you will receive an invoice from [facility]. Please contact [facility] at [phone] with questions or concerns regarding your invoice.   Our billing staff will not be able to assist you with questions regarding bills from these companies.  You will be contacted with the lab results as soon as they are available. The fastest way to get your results is to activate your My Chart account. Instructions are located on the last page of this paperwork. If you have not heard from us regarding the results in 2 weeks, please contact this office.     

## 2017-10-16 ENCOUNTER — Ambulatory Visit: Payer: 59 | Admitting: Obstetrics & Gynecology

## 2017-10-16 ENCOUNTER — Other Ambulatory Visit: Payer: Self-pay

## 2017-10-16 ENCOUNTER — Other Ambulatory Visit (HOSPITAL_COMMUNITY)
Admission: RE | Admit: 2017-10-16 | Discharge: 2017-10-16 | Disposition: A | Discharge status: Home / Self Care | Payer: 59 | Source: Ambulatory Visit | Attending: Obstetrics & Gynecology | Admitting: Obstetrics & Gynecology

## 2017-10-16 ENCOUNTER — Encounter: Payer: Self-pay | Admitting: Obstetrics & Gynecology

## 2017-10-16 VITALS — BP 128/70 | HR 80 | Resp 16 | Ht 68.0 in | Wt 165.0 lb

## 2017-10-16 DIAGNOSIS — N926 Irregular menstruation, unspecified: Secondary | ICD-10-CM | POA: Diagnosis not present

## 2017-10-16 DIAGNOSIS — B3749 Other urogenital candidiasis: Secondary | ICD-10-CM | POA: Diagnosis not present

## 2017-10-16 DIAGNOSIS — Z01419 Encounter for gynecological examination (general) (routine) without abnormal findings: Secondary | ICD-10-CM | POA: Diagnosis not present

## 2017-10-16 DIAGNOSIS — Z124 Encounter for screening for malignant neoplasm of cervix: Secondary | ICD-10-CM | POA: Insufficient documentation

## 2017-10-16 MED ORDER — NORETHINDRONE 0.35 MG PO TABS: 1.0000 | ORAL_TABLET | Freq: Every day | ORAL | 4 refills | Status: DC
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
## 2017-10-17 LAB — TSH: TSH: 0.872 u[IU]/mL (ref 0.450–4.500)

## 2017-10-17 LAB — FOLLICLE STIMULATING HORMONE: FSH: 6.3 m[IU]/mL

## 2017-10-20 ENCOUNTER — Other Ambulatory Visit: Payer: Self-pay | Admitting: *Deleted

## 2017-10-20 DIAGNOSIS — N926 Irregular menstruation, unspecified: Secondary | ICD-10-CM

## 2017-10-20 LAB — CYTOLOGY - PAP
Diagnosis: NEGATIVE
HPV: NOT DETECTED

## 2017-10-20 NOTE — Telephone Encounter (Signed)
Notes recorded by Burnice Logan, RN on 10/20/2017 at 4:41 PM EST Left message to call Sharee Pimple at [phone]. See telephone encounter dated 10/20/17. ------  Notes recorded by Megan Salon, MD on 10/18/2017 at 5:38 AM EST Notified pt via mychart of normal results but pt needs to proceed with ultrasound and we discussed this at her visit. Can you please call her to schedule.

## 2017-10-21 NOTE — Telephone Encounter (Signed)
Spoke with patient, scheduled for PUS on  11/05/17 at 1pm with consult to follow at 1:30pm with Dr. Sabra Heck. Order placed.   Patient reports vaginal itching and cottage cheese vaginal d/c, requesting RX for yeast.   Patient requesting pap results dated 10/16/17. Advised patient will forward to Dr. Sabra Heck for review and will return call. Advised patient Dr. Sabra Heck is seeing patients, response may not be immediate.   Dr. Sabra Heck- please review pap results dated 10/16/17 and advise?   Cc: Lerry Liner

## 2017-10-22 MED ORDER — FLUCONAZOLE 150 MG PO TABS
ORAL_TABLET | ORAL | 0 refills | Status: DC
Start: 2017-10-22 — End: 2017-11-19

## 2017-10-22 NOTE — Telephone Encounter (Signed)
Spoke with patient, advised as seen below per Dr. Sabra Heck. Rx for diflucan to verified pharmacy. Patient verbalizes understanding and is agreeable. Will close encounter.

## 2017-10-22 NOTE — Telephone Encounter (Signed)
Please let pt know pap was normal and HPV was negative.  Yeast present.  Ok to treat with diflucan 150mg  po x 1, repeat 72 hours.  #2/0RF.

## 2017-10-22 NOTE — Telephone Encounter (Signed)
Left message to call Mohamed Portlock at [phone].  

## 2017-11-05 ENCOUNTER — Other Ambulatory Visit: Payer: Self-pay

## 2017-11-05 ENCOUNTER — Other Ambulatory Visit: Payer: Self-pay | Admitting: Obstetrics & Gynecology

## 2017-11-06 ENCOUNTER — Telehealth: Payer: Self-pay | Admitting: Obstetrics & Gynecology
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
## 2017-11-19 ENCOUNTER — Ambulatory Visit: Payer: 59 | Admitting: Obstetrics & Gynecology

## 2017-11-19 ENCOUNTER — Other Ambulatory Visit: Payer: 59

## 2017-11-19 ENCOUNTER — Ambulatory Visit (INDEPENDENT_AMBULATORY_CARE_PROVIDER_SITE_OTHER): Payer: 59

## 2017-11-19 ENCOUNTER — Other Ambulatory Visit: Payer: Self-pay | Admitting: Obstetrics & Gynecology

## 2017-11-19 VITALS — BP 140/78 | HR 64 | Resp 14

## 2017-11-19 DIAGNOSIS — N926 Irregular menstruation, unspecified: Secondary | ICD-10-CM | POA: Diagnosis not present

## 2017-11-19 NOTE — Progress Notes (Signed)
37 y.o. G0 Single Caucasian female here for a pelvic ultrasound with sonohystogram due to intermittent and irrgular bleeding.  She is on OCP.  Irregular bleeding seems to be getting more frequent.  Does not have any pelvic pain or abnormal vaginal discharge.  Contraception: OCPs  Technique:  Both transabdominal and transvaginal ultrasound examinations of the pelvis were performed.  Transabdominal technique was performed for global imaging of the pelvis including uterus, ovaries, adnexal regions, and pelvic cul-de-sac.  It was necessary to proceed with endovaginal exam following the abdominal ultrasound transabdominal exam to visualize the endometrium and adnexa.  Color and duplex Doppler ultrasound was utilized to evaluate blood flow to the ovaries.   FINDINGS: Uterus:  6.9 x 3.8 x 3.0cm with single 64mm intramural fibroid Endometrium: 3.9mm, echogenic with possible polyp Adnexa:  Left: 4.1 x 2.6 x 2.7cm     Right: 2.7 x 1.7 x 1.2cm Cul de sac:  SHSG:  After obtaining appropriate verbal consent from patient, the cervix was visualized using a speculum, and prepped with betadine.  A tenaculum  was applied to the cervix.  Dilation of the cervix was not necessary. The catheter was passed into the uterus and sterile saline introduced, with the following findings: 1cm filling defect with anterior attachment  c/w polyp.  Hysteroscopy with polyp resection, D&C recommended due to size of polyp and synmptomatic nature.  Reviewed recommendations.  Conservative follow-up could be done but this would not follow current guidelines.  Pt desires for irregular bleeding to improve so desires to proceed.  Procedure discussed with patient.  Recovery and pain management discussed.  Risks discussed including but not limited to bleeding, rare risk of transfusion, infection, 1% risk of uterine perforation with risks of fluid deficit causing cardiac arrythmia, cerebral swelling and/or need to stop procedure early.  Fluid  emboli and rare risk of death discussed.  DVT/PE, rare risk of risk of bowel/bladder/ureteral/vascular injury.  Patient aware if pathology abnormal she may need additional treatment.  All questions answered.    Physical Exam  Constitutional: She is oriented to person, place, and time. She appears well-developed and well-nourished.  Cardiovascular: Normal rate and regular rhythm.  Neurological: She is alert and oriented to person, place, and time.  Skin: Skin is warm and dry.  Psychiatric: She has a normal mood and affect.   Assessment: Irregular bleeding Endometrial mass likely polyp  Plan:   Hysteroscopic polyp resection with D&C recommended.  Pt desires to proceed with scheduling today while in office.  Pre and post op instructions given today.  ~25 minutes spent with patient >50% of time was in face to face discussion of above.

## 2017-11-19 NOTE — Progress Notes (Signed)
Following appointment with Dr Sabra Heck, Hysteroscopy/ Polypectomy with Myosure/D&C scheduled for 11-30-17 ay [facility] at 1130. Surgery instruction sheet reviewed and printed copy given to patient.

## 2017-11-20 ENCOUNTER — Other Ambulatory Visit: Payer: Self-pay | Admitting: Obstetrics & Gynecology

## 2017-11-22 ENCOUNTER — Encounter: Payer: Self-pay | Admitting: Obstetrics & Gynecology

## 2017-11-22 DIAGNOSIS — N926 Irregular menstruation, unspecified: Secondary | ICD-10-CM | POA: Insufficient documentation

## 2017-11-23 ENCOUNTER — Other Ambulatory Visit: Payer: Self-pay

## 2017-11-23 ENCOUNTER — Telehealth: Payer: Self-pay | Admitting: Obstetrics & Gynecology

## 2017-11-23 ENCOUNTER — Encounter (HOSPITAL_BASED_OUTPATIENT_CLINIC_OR_DEPARTMENT_OTHER): Payer: Self-pay | Admitting: *Deleted

## 2017-11-23 NOTE — Telephone Encounter (Signed)
Left message on voicemail regarding surgery time change. Surgery scheduled for Monday 11/30/17 at 9:00 am with a 7:00 arrival time at hospital.

## 2017-11-23 NOTE — Progress Notes (Addendum)
SPOKE W/ PT VIA PHONE FOR PRE-OP INTERVIEW.  NPO AFTER MN.  ARRIVE AT [id number].  GETTING CBC AND SERUM PREG. DONE Thursday 11-26-2017 AT 0900.  ADDENDUM:  CALLED AND SPOKE W/ PT VIA PHONE ABOUT SURGERY START TIME CHANGE, TODAY Tuesday 11-24-2017 AT 0935.  PT VERBALIZED UNDERSTANDING TO ARRIVE AT 0710 INSTEAD OF PREVIOUS ADVISED TIME.

## 2017-11-25 NOTE — Telephone Encounter (Signed)
Call to patient. Confirmed patient received message regarding surgery time change.  Routing to provider for final review. Patient agreeable to disposition. Will close encounter.

## 2017-11-26 ENCOUNTER — Encounter (HOSPITAL_COMMUNITY)
Admission: RE | Admit: 2017-11-26 | Discharge: 2017-11-26 | Disposition: A | Discharge status: Home / Self Care | Payer: 59 | Source: Ambulatory Visit | Attending: Obstetrics & Gynecology | Admitting: Obstetrics & Gynecology

## 2017-11-26 DIAGNOSIS — Z01812 Encounter for preprocedural laboratory examination: Secondary | ICD-10-CM | POA: Diagnosis present

## 2017-11-26 DIAGNOSIS — N926 Irregular menstruation, unspecified: Secondary | ICD-10-CM | POA: Insufficient documentation

## 2017-11-26 DIAGNOSIS — J309 Allergic rhinitis, unspecified: Secondary | ICD-10-CM | POA: Insufficient documentation

## 2017-11-26 LAB — CBC
HEMATOCRIT: 42.6 % (ref 36.0–46.0)
HEMOGLOBIN: 14.7 g/dL (ref 12.0–15.0)
MCH: 33 pg (ref 26.0–34.0)
MCHC: 34.5 g/dL (ref 30.0–36.0)
MCV: 95.5 fL (ref 78.0–100.0)
Platelets: 235 10*3/uL (ref 150–400)
RBC: 4.46 MIL/uL (ref 3.87–5.11)
RDW: 12.6 % (ref 11.5–15.5)
WBC: 7.9 10*3/uL (ref 4.0–10.5)

## 2017-11-26 LAB — HCG, SERUM, QUALITATIVE: Preg, Serum: NEGATIVE

## 2017-11-30 ENCOUNTER — Encounter (HOSPITAL_BASED_OUTPATIENT_CLINIC_OR_DEPARTMENT_OTHER)
Admission: RE | Disposition: A | Discharge status: Home / Self Care | Payer: Self-pay | Source: Ambulatory Visit | Attending: Obstetrics & Gynecology

## 2017-11-30 ENCOUNTER — Ambulatory Visit (HOSPITAL_BASED_OUTPATIENT_CLINIC_OR_DEPARTMENT_OTHER): Payer: 59 | Admitting: Anesthesiology

## 2017-11-30 ENCOUNTER — Ambulatory Visit (HOSPITAL_BASED_OUTPATIENT_CLINIC_OR_DEPARTMENT_OTHER)
Admission: RE | Admit: 2017-11-30 | Discharge: 2017-11-30 | Disposition: A | Discharge status: Home / Self Care | Payer: 59 | Source: Ambulatory Visit | Attending: Obstetrics & Gynecology | Admitting: Obstetrics & Gynecology

## 2017-11-30 ENCOUNTER — Encounter (HOSPITAL_BASED_OUTPATIENT_CLINIC_OR_DEPARTMENT_OTHER): Payer: Self-pay

## 2017-11-30 DIAGNOSIS — Z79899 Other long term (current) drug therapy: Secondary | ICD-10-CM | POA: Insufficient documentation

## 2017-11-30 DIAGNOSIS — N840 Polyp of corpus uteri: Secondary | ICD-10-CM | POA: Insufficient documentation

## 2017-11-30 DIAGNOSIS — N939 Abnormal uterine and vaginal bleeding, unspecified: Secondary | ICD-10-CM | POA: Diagnosis not present

## 2017-11-30 HISTORY — DX: Polyp of corpus uteri: N84.0

## 2017-11-30 HISTORY — DX: Family history of other specified conditions: Z84.89

## 2017-11-30 HISTORY — DX: Personal history of other specified conditions: Z87.898

## 2017-11-30 HISTORY — DX: Other allergic rhinitis: J30.89

## 2017-11-30 HISTORY — PX: DILATATION & CURETTAGE/HYSTEROSCOPY WITH MYOSURE: SHX6511

## 2017-11-30 HISTORY — DX: Personal history of cervical dysplasia: Z87.410

## 2017-11-30 HISTORY — DX: Personal history of other diseases of the female genital tract: Z87.42

## 2017-11-30 HISTORY — DX: Acute nasopharyngitis (common cold): J00

## 2017-11-30 SURGERY — DILATATION & CURETTAGE/HYSTEROSCOPY WITH MYOSURE
Anesthesia: General

## 2017-11-30 MED ORDER — FENTANYL CITRATE (PF) 100 MCG/2ML IJ SOLN
INTRAMUSCULAR | Status: DC | PRN
  Administered 2017-11-30 (×2): 50 ug via INTRAVENOUS

## 2017-11-30 MED ORDER — DEXAMETHASONE SODIUM PHOSPHATE 10 MG/ML IJ SOLN
INTRAMUSCULAR | Status: DC | PRN
  Administered 2017-11-30: 10 mg via INTRAVENOUS

## 2017-11-30 MED ORDER — MIDAZOLAM HCL 2 MG/2ML IJ SOLN
INTRAMUSCULAR | Status: AC
  Filled 2017-11-30: qty 2

## 2017-11-30 MED ORDER — FENTANYL CITRATE (PF) 100 MCG/2ML IJ SOLN
INTRAMUSCULAR | Status: AC
  Filled 2017-11-30: qty 2

## 2017-11-30 MED ORDER — KETOROLAC TROMETHAMINE 30 MG/ML IJ SOLN
INTRAMUSCULAR | Status: DC | PRN
  Administered 2017-11-30: 30 mg via INTRAVENOUS

## 2017-11-30 MED ORDER — FENTANYL CITRATE (PF) 100 MCG/2ML IJ SOLN
25.0000 ug | INTRAMUSCULAR | Status: DC | PRN
  Filled 2017-11-30: qty 1

## 2017-11-30 MED ORDER — PROPOFOL 10 MG/ML IV BOLUS
INTRAVENOUS | Status: AC
  Filled 2017-11-30: qty 20

## 2017-11-30 MED ORDER — LIDOCAINE-EPINEPHRINE 1 %-1:100000 IJ SOLN
INTRAMUSCULAR | Status: DC | PRN
  Administered 2017-11-30: 17 mL

## 2017-11-30 MED ORDER — PROPOFOL 10 MG/ML IV BOLUS
INTRAVENOUS | Status: DC | PRN
  Administered 2017-11-30: 200 mg via INTRAVENOUS

## 2017-11-30 MED ORDER — HYDROCODONE-ACETAMINOPHEN 5-325 MG PO TABS: 1.0000 | ORAL_TABLET | Freq: Four times a day (QID) | ORAL | 0 refills | Status: DC | PRN

## 2017-11-30 MED ORDER — KETOROLAC TROMETHAMINE 30 MG/ML IJ SOLN
30.0000 mg | Freq: Once | INTRAMUSCULAR | Status: DC | PRN
  Filled 2017-11-30: qty 1

## 2017-11-30 MED ORDER — MIDAZOLAM HCL 5 MG/5ML IJ SOLN
INTRAMUSCULAR | Status: DC | PRN
  Administered 2017-11-30: 2 mg via INTRAVENOUS

## 2017-11-30 MED ORDER — PROMETHAZINE HCL 25 MG/ML IJ SOLN
6.2500 mg | INTRAMUSCULAR | Status: DC | PRN
  Filled 2017-11-30: qty 1

## 2017-11-30 MED ORDER — SCOPOLAMINE 1 MG/3DAYS TD PT72
1.0000 | MEDICATED_PATCH | TRANSDERMAL | Status: DC
  Filled 2017-11-30: qty 1

## 2017-11-30 MED ORDER — ONDANSETRON HCL 4 MG/2ML IJ SOLN
INTRAMUSCULAR | Status: DC | PRN
  Administered 2017-11-30: 4 mg via INTRAVENOUS

## 2017-11-30 MED ORDER — LACTATED RINGERS IV SOLN
INTRAVENOUS | Status: DC
  Administered 2017-11-30: 08:00:00 via INTRAVENOUS
  Filled 2017-11-30: qty 1000

## 2017-11-30 MED ORDER — LIDOCAINE 2% (20 MG/ML) 5 ML SYRINGE
INTRAMUSCULAR | Status: DC | PRN
  Administered 2017-11-30: 100 mg via INTRAVENOUS

## 2017-11-30 MED ORDER — SCOPOLAMINE 1 MG/3DAYS TD PT72
MEDICATED_PATCH | TRANSDERMAL | Status: AC
  Filled 2017-11-30: qty 1

## 2017-11-30 SURGICAL SUPPLY — 23 items
BIPOLAR CUTTING LOOP 21FR (ELECTRODE)
CANISTER SUCT 3000ML PPV (MISCELLANEOUS) ×6 IMPLANT
CATH ROBINSON RED A/P 16FR (CATHETERS) ×3 IMPLANT
DEVICE MYOSURE LITE (MISCELLANEOUS) ×2 IMPLANT
DEVICE MYOSURE REACH (MISCELLANEOUS) IMPLANT
DILATOR CANAL MILEX (MISCELLANEOUS) IMPLANT
FILTER ARTHROSCOPY CONVERTOR (FILTER) ×3 IMPLANT
GLOVE ECLIPSE 6.5 STRL STRAW (GLOVE) ×6 IMPLANT
GLOVE INDICATOR 7.0 STRL GRN (GLOVE) ×3 IMPLANT
GOWN STRL REUS W/ TWL LRG LVL3 (GOWN DISPOSABLE) ×1 IMPLANT
GOWN STRL REUS W/ TWL XL LVL3 (GOWN DISPOSABLE) ×1 IMPLANT
GOWN STRL REUS W/TWL LRG LVL3 (GOWN DISPOSABLE) ×3
GOWN STRL REUS W/TWL XL LVL3 (GOWN DISPOSABLE) ×3
IV NS IRRIG 3000ML ARTHROMATIC (IV SOLUTION) ×6 IMPLANT
KIT RM TURNOVER CYSTO AR (KITS) ×3 IMPLANT
LOOP CUTTING BIPOLAR 21FR (ELECTRODE) IMPLANT
PACK VAGINAL MINOR WOMEN LF (CUSTOM PROCEDURE TRAY) ×3 IMPLANT
PAD OB MATERNITY 4.3X12.25 (PERSONAL CARE ITEMS) ×3 IMPLANT
SEAL ROD LENS SCOPE MYOSURE (ABLATOR) ×3 IMPLANT
TOWEL OR 17X24 6PK STRL BLUE (TOWEL DISPOSABLE) ×6 IMPLANT
TUBING AQUILEX INFLOW (TUBING) ×3 IMPLANT
TUBING AQUILEX OUTFLOW (TUBING) ×3 IMPLANT
WATER STERILE IRR 500ML POUR (IV SOLUTION) ×3 IMPLANT

## 2017-11-30 NOTE — Discharge Instructions (Addendum)
Post-surgical Instructions, Outpatient Surgery  You may expect to feel dizzy, weak, and drowsy for as long as 24 hours after receiving the medicine that made you sleep (anesthetic). For the first 24 hours after your surgery:   Do not drive a car, ride a bicycle, participate in physical activities, or take public transportation until you are done taking narcotic pain medicines or as directed by Dr. Miller.  Do not drink alcohol or take tranquilizers.  Do not take medicine that has not been prescribed by your physicians.  Do not sign important papers or make important decisions while on narcotic pain medicines.  Have a responsible person with you.   PAIN MANAGEMENT Motrin 800mg.  (This is the same as 4-200mg over the counter tablets of Motrin or ibuprofen.)  You may take this every eight hours or as needed for cramping.   Vicodin 5/325mg.  For more severe pain, take one or two tablets every four to six hours as needed for pain control.  (Remember that narcotic pain medications increase your risk of constipation.  If this becomes a problem, you may take an over the counter stool softener like Colace 100mg up to four times a day.)  DO'S AND DON'T'S Do not take a tub bath for one week.  You may shower on the first day after your surgery Do not do any heavy lifting for one to two weeks.  This increases the chance of bleeding. Do move around as you feel able.  Stairs are fine.  You may begin to exercise again as you feel able.  Do not lift any weights for two weeks. Do not put anything in the vagina for two weeks--no tampons, intercourse, or douching.    REGULAR MEDIATIONS/VITAMINS: You may restart all of your regular medications as prescribed. You may restart all of your vitamins as you normally take them.    PLEASE CALL OR SEEK MEDICAL CARE IF: You have persistent nausea and vomiting.  You have trouble eating or drinking.  You have an oral temperature above 100.5.  You have constipation that is  not helped by adjusting diet or increasing fluid intake. Pain medicines are a common cause of constipation.  You have heavy vaginal bleeding   Post Anesthesia Home Care Instructions  Activity: Get plenty of rest for the remainder of the day. A responsible individual must stay with you for 24 hours following the procedure.  For the next 24 hours, DO NOT: -Drive a car -Operate machinery -Drink alcoholic beverages -Take any medication unless instructed by your physician -Make any legal decisions or sign important papers.  Meals: Start with liquid foods such as gelatin or soup. Progress to regular foods as tolerated. Avoid greasy, spicy, heavy foods. If nausea and/or vomiting occur, drink only clear liquids until the nausea and/or vomiting subsides. Call your physician if vomiting continues.  Special Instructions/Symptoms: Your throat may feel dry or sore from the anesthesia or the breathing tube placed in your throat during surgery. If this causes discomfort, gargle with warm salt water. The discomfort should disappear within 24 hours.  If you had a scopolamine patch placed behind your ear for the management of post- operative nausea and/or vomiting:  1. The medication in the patch is effective for 72 hours, after which it should be removed.  Wrap patch in a tissue and discard in the trash. Wash hands thoroughly with soap and water. 2. You may remove the patch earlier than 72 hours if you experience unpleasant side effects which may   include dry mouth, dizziness or visual disturbances. 3. Avoid touching the patch. Wash your hands with soap and water after contact with the patch.      

## 2017-11-30 NOTE — Op Note (Signed)
11/30/2017  9:14 AM  PATIENT:  [name]  37 y.o. female  PRE-OPERATIVE DIAGNOSIS:  AUB, endometrial polyp  POST-OPERATIVE DIAGNOSIS:  AUB, endometrial polyp  PROCEDURE:  Procedure(s): DILATATION & CURETTAGE/HYSTEROSCOPY WITH MYOSURE  SURGEON:  Megan Salon  ASSISTANTS: OR staff   ANESTHESIA:   general  ESTIMATED BLOOD LOSS: 10 mL  BLOOD ADMINISTERED:none   FLUIDS: 1000ccLR  UOP: 75cc  SPECIMEN:  Endometrial curettings and endometrial polyp  DISPOSITION OF SPECIMEN:  PATHOLOGY  FINDINGS: anterior polyp noted, thin endometrium present otherwise  DESCRIPTION OF OPERATION: Patient was taken to the operating room.  She is placed in the supine position. SCDs were on her lower extremities and functioning properly. General anesthesia with an LMA was administered without difficulty. Dr. Kalman Shan, anesthesia, oversaw case.  Legs were then placed in the [place] in the low lithotomy position. The legs were lifted to the high lithotomy position and the Betadine prep was used on the inner thighs perineum and vagina x3. Patient was draped in a normal standard fashion. An in and out catheterization with a red rubber Foley catheter was performed. Approximately 75 cc of clear urine was noted. A bivalve speculum was placed the vagina. The anterior lip of the cervix was grasped with single-tooth tenaculum.  A paracervical block of 1% lidocaine mixed one-to-one with epinephrine (1:100,000 units).  10 cc was used total. The cervix is dilated up to #21 [facility] dilators. The endometrial cavity sounded to 7 cm.   A myosure diagnostic hysteroscope was obtained. Normal saline was used as a hysteroscopic fluid. The hysteroscope was advanced through the endocervical canal into the endometrial cavity. The tubal ostia were noted bilaterally. Additional findings included anterior endometrial polyp about the length of the endometrial cavity was ntoed.  Using the Myosure Lite device, this polyp was full  resected.  Then the hysteroscope was removed. A #1 toothed curette was used to curette the cavity until rough gritty texture was noted in all quadrants. With revisualization of the hysteroscope, there was no longer any abnormal findings.  The hysteroscope was removed. The fluid deficit was 110 cc of NS. The tenaculum was removed from the anterior lip of the cervix. The speculum was removed from the vagina. The prep was cleansed of the patient's skin. The legs are positioned back in the supine position. Sponge, lap, needle, initially counts were correct x2. Patient was taken to recovery in stable condition.  COUNTS:  YES  PLAN OF CARE: Transfer to PACU

## 2017-11-30 NOTE — Transfer of Care (Signed)
Immediate Anesthesia Transfer of Care Note  Patient: [name]  Procedure(s) Performed: DILATATION & CURETTAGE/HYSTEROSCOPY WITH MYOSURE (N/A )  Patient Location: PACU  Anesthesia Type:General  Level of Consciousness: awake and alert   Airway & Oxygen Therapy: Patient Spontanous Breathing and Patient connected to nasal cannula oxygen  Post-op Assessment: Report given to RN  Post vital signs: Reviewed and stable  Last Vitals: 118/86, 77, 16, 100$%, 97.9 Vitals:   11/30/17 0713  BP: 114/68  Pulse: 65  Resp: 16  Temp: 36.5 C  SpO2: 99%    Last Pain:  Vitals:   11/30/17 0713  TempSrc: Oral      Patients Stated Pain Goal: 7 ([id number])  Complications: No apparent anesthesia complications

## 2017-11-30 NOTE — Anesthesia Preprocedure Evaluation (Addendum)
Anesthesia Evaluation  Patient identified by MRN, date of birth, ID band Patient awake    Reviewed: Allergy & Precautions, NPO status , Patient's Chart, lab work & pertinent test results  Airway Mallampati: II  TM Distance: >3 FB Neck ROM: Full    Dental no notable dental hx.    Pulmonary neg pulmonary ROS,    Pulmonary exam normal breath sounds clear to auscultation       Cardiovascular negative cardio ROS Normal cardiovascular exam Rhythm:Regular Rate:Normal     Neuro/Psych negative neurological ROS  negative psych ROS   GI/Hepatic negative GI ROS, Neg liver ROS,   Endo/Other  negative endocrine ROS  Renal/GU negative Renal ROS  negative genitourinary   Musculoskeletal negative musculoskeletal ROS (+)   Abdominal   Peds negative pediatric ROS (+)  Hematology negative hematology ROS (+)   Anesthesia Other Findings   Reproductive/Obstetrics negative OB ROS                             Anesthesia Physical Anesthesia Plan  ASA: II  Anesthesia Plan: General   Post-op Pain Management:    Induction: Intravenous  PONV Risk Score and Plan: 3 and Ondansetron, Dexamethasone, Treatment may vary due to age or medical condition and Scopolamine patch - Pre-op  Airway Management Planned: LMA  Additional Equipment:   Intra-op Plan:   Post-operative Plan: Extubation in OR  Informed Consent: I have reviewed the patients History and Physical, chart, labs and discussed the procedure including the risks, benefits and alternatives for the proposed anesthesia with the patient or authorized representative who has indicated his/her understanding and acceptance.   Dental advisory given  Plan Discussed with: CRNA and Surgeon  Anesthesia Plan Comments:        Anesthesia Quick Evaluation

## 2017-11-30 NOTE — Anesthesia Postprocedure Evaluation (Signed)
Anesthesia Post Note  Patient: [name]  Procedure(s) Performed: DILATATION & CURETTAGE/HYSTEROSCOPY WITH MYOSURE (N/A )     Patient location during evaluation: PACU Anesthesia Type: General Level of consciousness: awake and alert Pain management: pain level controlled Vital Signs Assessment: post-procedure vital signs reviewed and stable Respiratory status: spontaneous breathing, nonlabored ventilation, respiratory function stable and patient connected to nasal cannula oxygen Cardiovascular status: blood pressure returned to baseline and stable Postop Assessment: no apparent nausea or vomiting Anesthetic complications: no    Last Vitals:  Vitals:   11/30/17 0927 11/30/17 0930  BP:  109/76  Pulse: (!) 57 (!) 57  Resp: 17 15  Temp:    SpO2: 100% 100%    Last Pain:  Vitals:   11/30/17 0920  TempSrc:   PainSc: 2                  [name]
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
## 2017-11-30 NOTE — Anesthesia Procedure Notes (Signed)
Procedure Name: LMA Insertion Date/Time: 11/30/2017 9:13 AM Performed by: Bonney Aid, CRNA Pre-anesthesia Checklist: Patient identified, Emergency Drugs available, Suction available and Patient being monitored Patient Re-evaluated:Patient Re-evaluated prior to induction Oxygen Delivery Method: Circle system utilized Preoxygenation: Pre-oxygenation with 100% oxygen Induction Type: IV induction Ventilation: Mask ventilation without difficulty LMA: LMA inserted LMA Size: 4.0 Number of attempts: 1 Airway Equipment and Method: Bite block Placement Confirmation: positive ETCO2 Tube secured with: Tape Dental Injury: Teeth and Oropharynx as per pre-operative assessment

## 2017-12-01 ENCOUNTER — Encounter (HOSPITAL_BASED_OUTPATIENT_CLINIC_OR_DEPARTMENT_OTHER): Payer: Self-pay | Admitting: Obstetrics & Gynecology

## 2017-12-14 NOTE — Progress Notes (Signed)
Post Operative Visit  Procedure:D & C/hysteroscopy w/Myosure Days Post-op: 2 weeks  Subjective: Doing well.  Having some bleeding today.  Had minimal pain.  No fevers.  Took nothing for pain.  Aware could have irregular bleeding over the next few weeks.  If continues past a month, she is aware I would like to be notified.  Denies fever.  Bladder and bowel function is normal.  Pictures and pathology reviewed.   Objective: BP (!) 100/58 (BP Location: Right Arm, Patient Position: Sitting, Cuff Size: Normal)   Pulse (!) 50   Wt 170 lb 9.6 oz (77.4 kg)   LMP 12/15/2017 (Exact Date)   BMI 25.94 kg/m   EXAM General: alert and no distress Resp: clear to auscultation bilaterally Cardio: regular rate and rhythm, S1, S2 normal, no murmur, click, rub or gallop GI: soft, non-tender; bowel sounds normal; no masses,  no organomegaly Extremities: extremities normal, atraumatic, no cyanosis or edema Vaginal Bleeding: minimal  Assessment: s/p hysteroscopy with polyp resection, D&C  Plan: Pt will call with any future irregular bleeding, especially if past the next month.  Questions answered.

## 2017-12-15 ENCOUNTER — Ambulatory Visit: Payer: 59 | Admitting: Obstetrics & Gynecology

## 2017-12-16 ENCOUNTER — Encounter: Payer: Self-pay | Admitting: Obstetrics & Gynecology

## 2017-12-16 ENCOUNTER — Ambulatory Visit (INDEPENDENT_AMBULATORY_CARE_PROVIDER_SITE_OTHER): Payer: 59 | Admitting: Obstetrics & Gynecology

## 2017-12-16 VITALS — BP 100/58 | HR 50 | Wt 170.6 lb

## 2017-12-16 DIAGNOSIS — N840 Polyp of corpus uteri: Secondary | ICD-10-CM | POA: Diagnosis not present

## 2018-01-27 ENCOUNTER — Telehealth: Payer: Self-pay | Admitting: Obstetrics & Gynecology

## 2018-01-27 NOTE — Telephone Encounter (Signed)
Patient is having break thru bleeding on birth control.

## 2018-01-27 NOTE — Telephone Encounter (Signed)
Spoke with patient. Patient is taking Micronor. States she has been having ongoing BTB in the middle of the month. Discussed this with Dr.Miller at her visit in 12/2017 and states she was told to give it another month and see how her body adjusted. States she had a cycle at the middle to end of the first week of her pill pack this mornth. 2 days ago she began spotting again. Has 1 week and 3 days left in her pack. States she was placed on Micronor due to irregular bleeding, then found out she had a polyp causing the bleeding and had this removed. States she now is having ongoing BTB due to POP. Asking for recommendations on how to proceed. States she does not need POP for contraception, husband has a vasectomy.Advised will review with covering MD and return call.

## 2018-01-27 NOTE — Telephone Encounter (Signed)
Spoke with patient. Patient requests I review this with Dr.Miller upon her return to the office and return call.

## 2018-01-27 NOTE — Telephone Encounter (Signed)
Left message to call Arlone Lenhardt at [phone].  

## 2018-01-27 NOTE — Telephone Encounter (Signed)
Please schedule an appointment with Dr. Sabra Heck. I think she would like to be aware if the irregular bleeding has continued.   Cc- Dr. Sabra Heck

## 2018-01-28 NOTE — Telephone Encounter (Signed)
Spoke with patient. Advised of message as seen below from Dr.Miller. Patient verbalizes understanding. Encounter closed. 

## 2018-01-28 NOTE — Telephone Encounter (Signed)
Left message to call Arthor Gorter at [phone]. 

## 2018-01-28 NOTE — Telephone Encounter (Signed)
As she does not need the pill for contraception, I'd just stop it and see if the bleeding resolves.  Thanks.

## 2018-12-28 ENCOUNTER — Other Ambulatory Visit: Payer: Self-pay

## 2018-12-28 ENCOUNTER — Ambulatory Visit (INDEPENDENT_AMBULATORY_CARE_PROVIDER_SITE_OTHER): Payer: 59 | Admitting: Obstetrics & Gynecology

## 2018-12-28 ENCOUNTER — Encounter: Payer: Self-pay | Admitting: Obstetrics & Gynecology

## 2018-12-28 VITALS — BP 104/60 | HR 64 | Resp 16 | Ht 68.25 in | Wt 174.6 lb

## 2018-12-28 DIAGNOSIS — Z01419 Encounter for gynecological examination (general) (routine) without abnormal findings: Secondary | ICD-10-CM

## 2018-12-28 MED ORDER — SUMATRIPTAN SUCCINATE 100 MG PO TABS: 100.0000 mg | ORAL_TABLET | ORAL | 1 refills | Status: DC | PRN
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
## 2020-03-27 ENCOUNTER — Other Ambulatory Visit (HOSPITAL_COMMUNITY)
Admission: RE | Admit: 2020-03-27 | Discharge: 2020-03-27 | Disposition: A | Discharge status: Home / Self Care | Payer: PRIVATE HEALTH INSURANCE | Source: Ambulatory Visit | Attending: Obstetrics & Gynecology | Admitting: Obstetrics & Gynecology

## 2020-03-27 ENCOUNTER — Ambulatory Visit (INDEPENDENT_AMBULATORY_CARE_PROVIDER_SITE_OTHER): Payer: PRIVATE HEALTH INSURANCE | Admitting: Obstetrics & Gynecology

## 2020-03-27 ENCOUNTER — Encounter: Payer: Self-pay | Admitting: Obstetrics & Gynecology

## 2020-03-27 ENCOUNTER — Other Ambulatory Visit: Payer: Self-pay | Admitting: Obstetrics & Gynecology

## 2020-03-27 VITALS — BP 112/60 | HR 72 | Temp 97.7°F | Ht 68.0 in | Wt 168.0 lb

## 2020-03-27 DIAGNOSIS — Z124 Encounter for screening for malignant neoplasm of cervix: Secondary | ICD-10-CM

## 2020-03-27 DIAGNOSIS — Z01419 Encounter for gynecological examination (general) (routine) without abnormal findings: Secondary | ICD-10-CM | POA: Diagnosis not present

## 2020-03-27 DIAGNOSIS — Z1231 Encounter for screening mammogram for malignant neoplasm of breast: Secondary | ICD-10-CM

## 2020-03-27 DIAGNOSIS — N871 Moderate cervical dysplasia: Secondary | ICD-10-CM

## 2020-03-27 DIAGNOSIS — Z23 Encounter for immunization: Secondary | ICD-10-CM | POA: Diagnosis not present
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
## 2020-03-28 LAB — CYTOLOGY - PAP
Comment: NEGATIVE
Diagnosis: NEGATIVE
High risk HPV: NEGATIVE

## 2020-04-03 ENCOUNTER — Ambulatory Visit
Admission: RE | Admit: 2020-04-03 | Discharge: 2020-04-03 | Disposition: A | Discharge status: Home / Self Care | Payer: 59 | Source: Ambulatory Visit | Attending: Obstetrics & Gynecology | Admitting: Obstetrics & Gynecology

## 2020-04-03 ENCOUNTER — Other Ambulatory Visit: Payer: Self-pay

## 2020-04-03 DIAGNOSIS — Z1231 Encounter for screening mammogram for malignant neoplasm of breast: Secondary | ICD-10-CM

## 2020-04-03 IMAGING — MG DIGITAL SCREENING BREAST BILAT IMPLANT W/ TOMO W/ CAD
9 of 16 series · 9 of 40 positions shown · non-contrast
Comparison: None.

CLINICAL DATA: Screening.

EXAM:
DIGITAL SCREENING BILATERAL MAMMOGRAM WITH IMPLANTS, CAD AND TOMO
The patient has retropectoral implants. Standard and implant
displaced views were performed.

[R MLO]
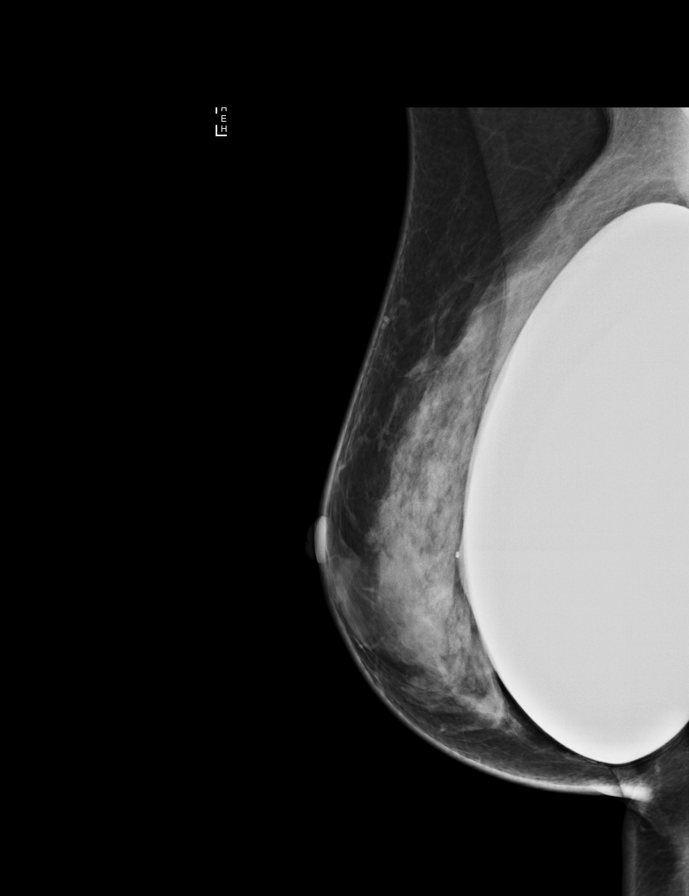

[L CC]
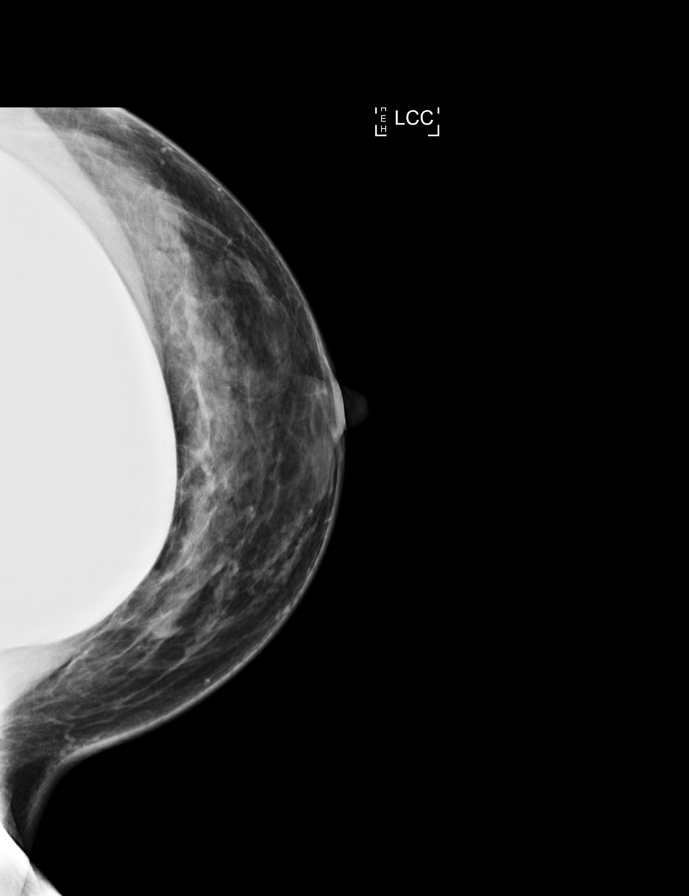

[L MLO]
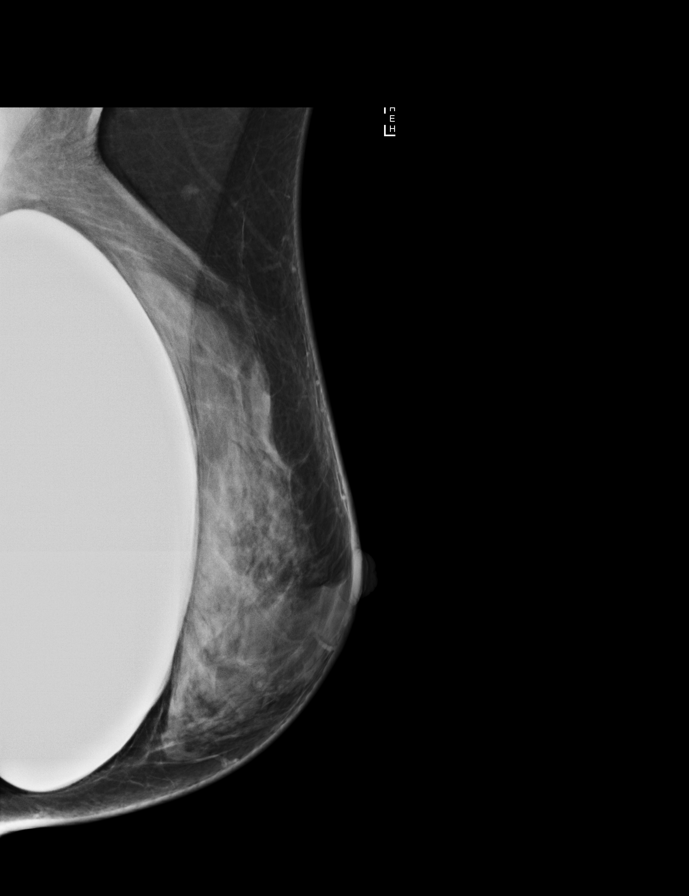

[R CC]
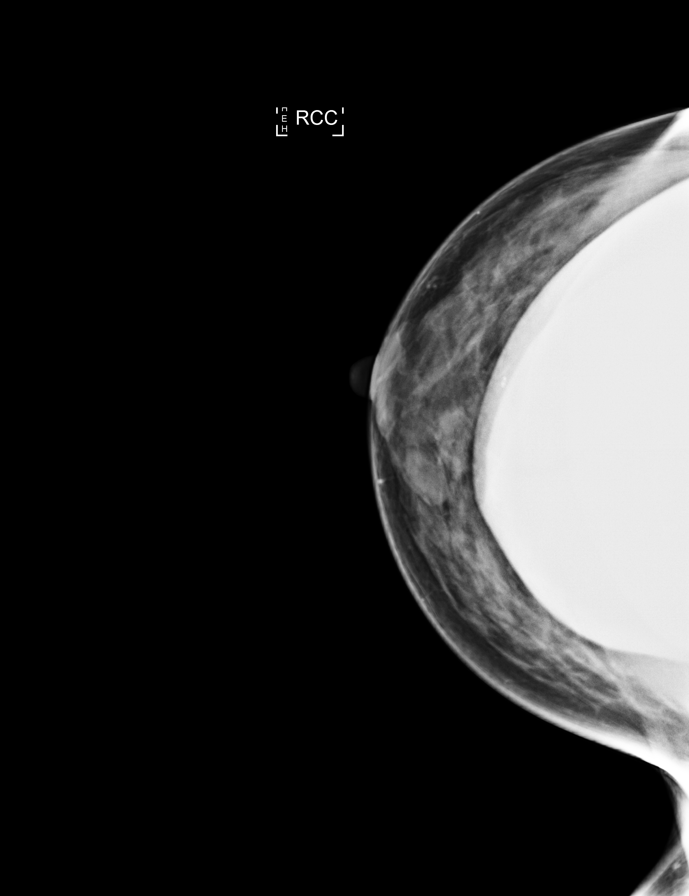

[R MLO synth-2D (1 of 2)]
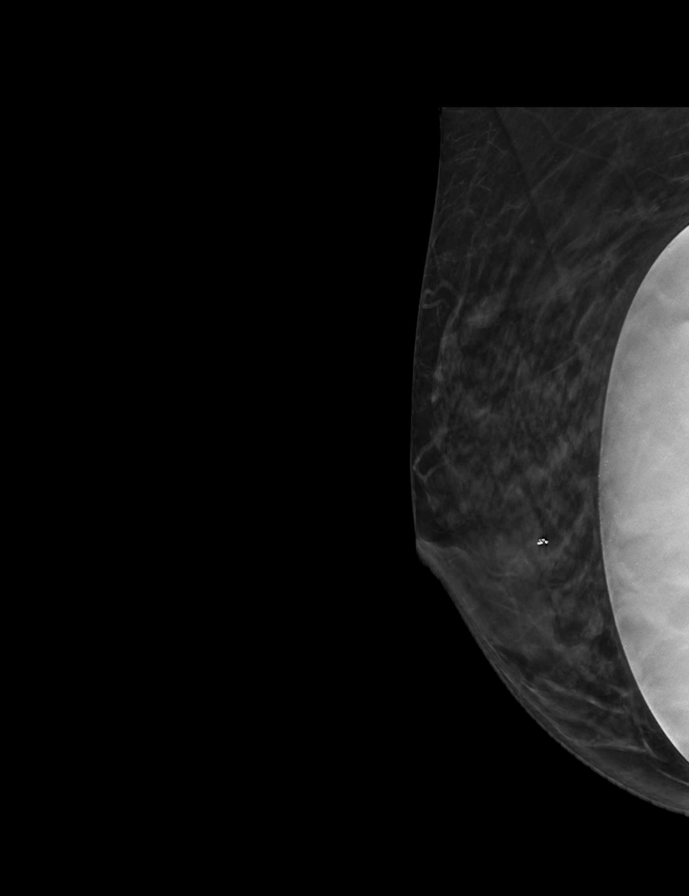

[R CC synth-2D (1 of 2)]
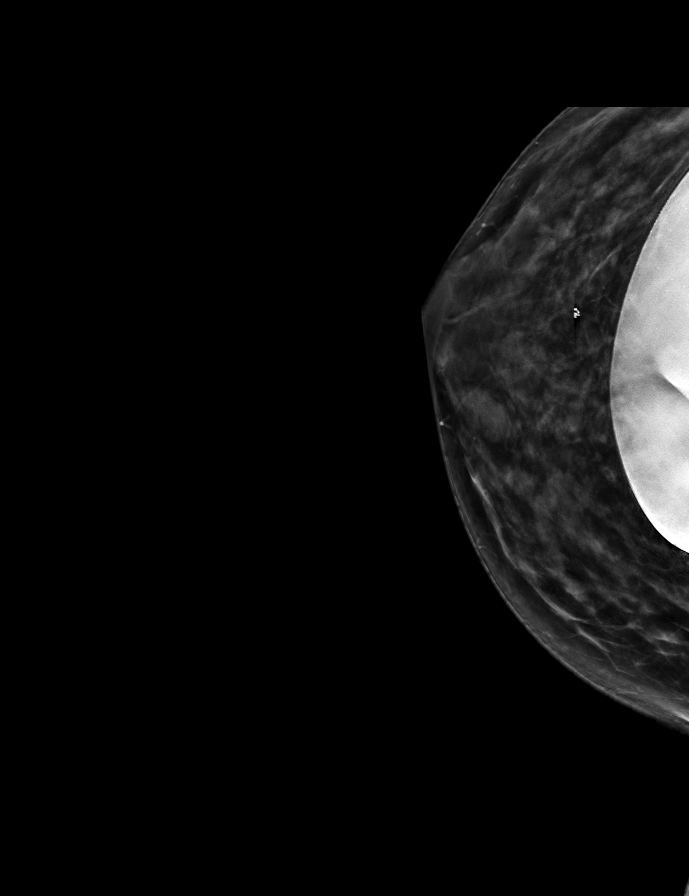

[R MLO synth-2D (2 of 2)]
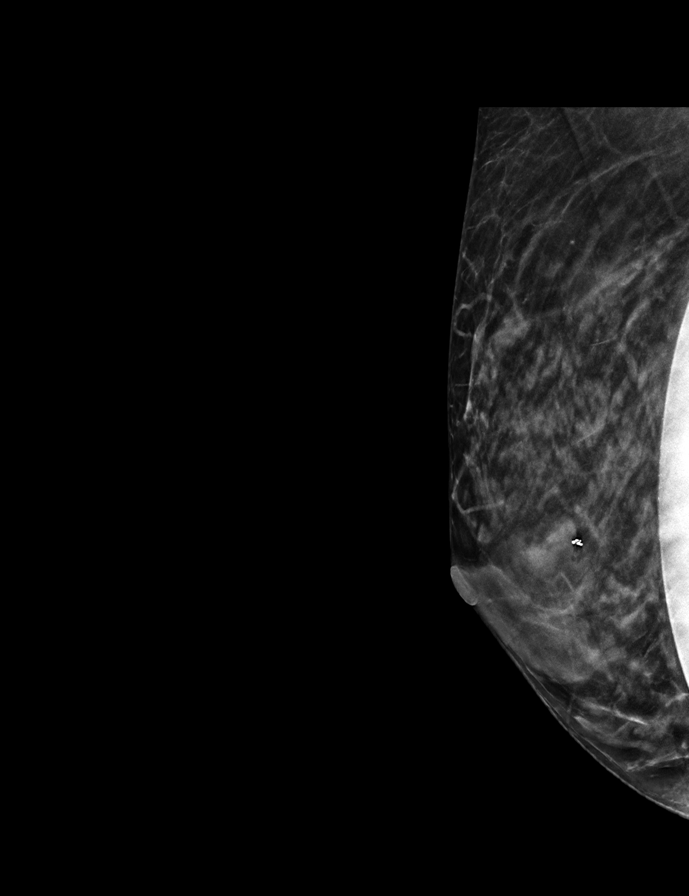

[L MLO synth-2D]
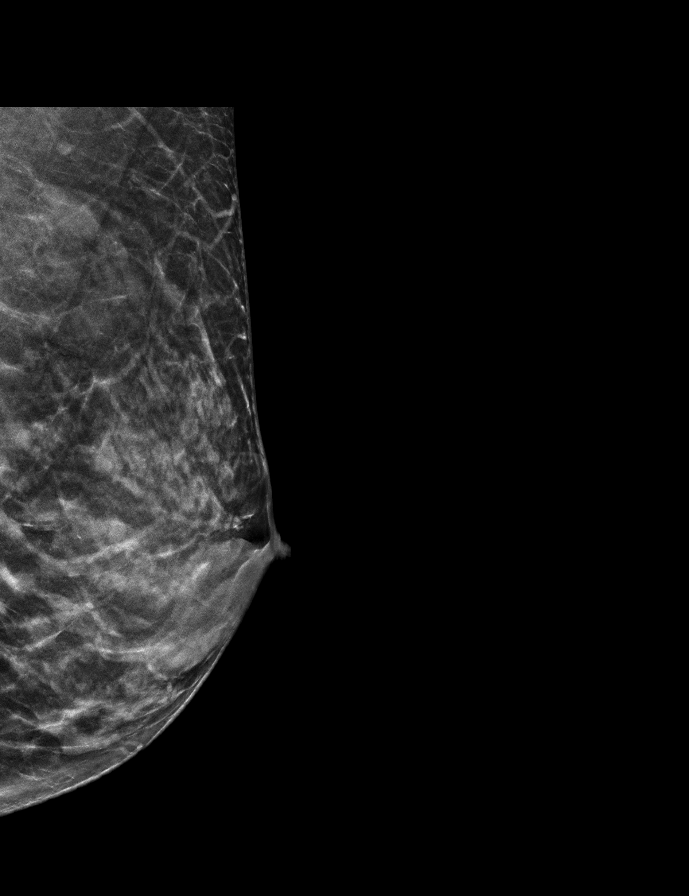

[R CC synth-2D (2 of 2)]
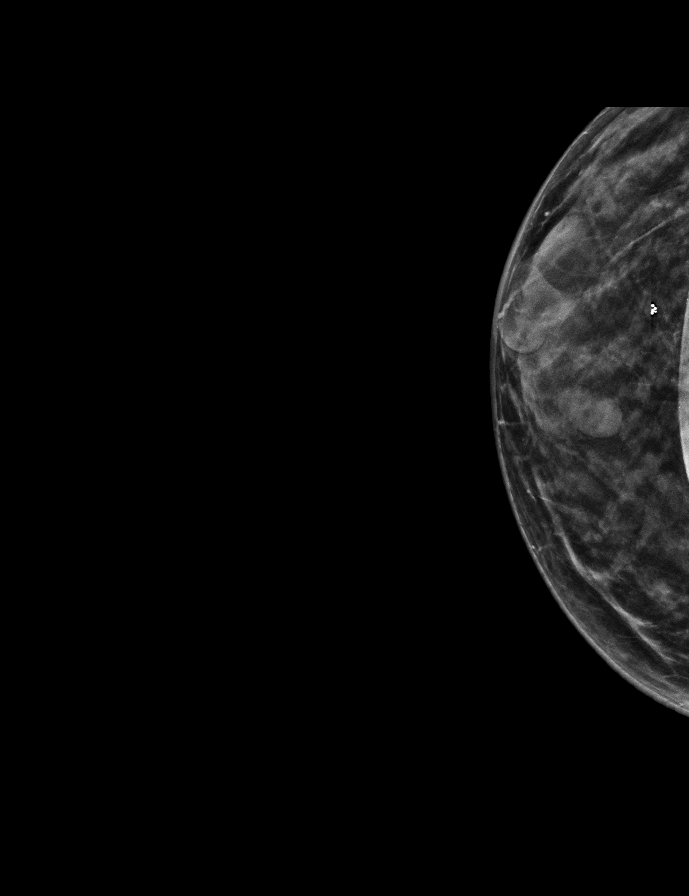

[9 of 40 positions shown; findings below may reference images not displayed]

ACR Breast Density Category c: The breast tissue is heterogeneously
dense, which may obscure small masses.
FINDINGS: In the right breast, a possible mass warrants further evaluation. In
the left breast, no findings suspicious for malignancy. Images were
processed with CAD.
IMPRESSION: Further evaluation is suggested for possible mass in the right
breast.

RECOMMENDATION:
Diagnostic mammogram and possibly ultrasound of the right breast.
(Code:[3T])

The patient will be contacted regarding the findings, and additional
imaging will be scheduled.

BI-RADS CATEGORY  0: Incomplete. Need additional imaging evaluation
and/or prior mammograms for comparison.

## 2020-04-04 ENCOUNTER — Other Ambulatory Visit: Payer: Self-pay | Admitting: Obstetrics & Gynecology

## 2020-04-04 DIAGNOSIS — R928 Other abnormal and inconclusive findings on diagnostic imaging of breast: Secondary | ICD-10-CM

## 2020-04-05 ENCOUNTER — Other Ambulatory Visit: Payer: Self-pay | Admitting: Obstetrics & Gynecology

## 2020-04-05 ENCOUNTER — Other Ambulatory Visit: Payer: Self-pay

## 2020-04-05 ENCOUNTER — Ambulatory Visit
Admission: RE | Admit: 2020-04-05 | Discharge: 2020-04-05 | Disposition: A | Discharge status: Home / Self Care | Payer: 59 | Source: Ambulatory Visit | Attending: Obstetrics & Gynecology | Admitting: Obstetrics & Gynecology

## 2020-04-05 DIAGNOSIS — R928 Other abnormal and inconclusive findings on diagnostic imaging of breast: Secondary | ICD-10-CM

## 2020-04-05 IMAGING — US US BREAST*R* LIMITED INC AXILLA
1 series · 10 of 10 positions shown · non-contrast
Comparison: Previous exam(s).

CLINICAL DATA: 39-year-old female for further evaluation of
possible RIGHT breast masses on baseline screening mammogram.

EXAM:
DIGITAL DIAGNOSTIC RIGHT MAMMOGRAM WITH CAD AND TOMO
ULTRASOUND RIGHT BREAST

[Series 1: us breast*right* limited inc axilla · 0.06mm/px · 10 of 10 slices shown]
[im 1/10]
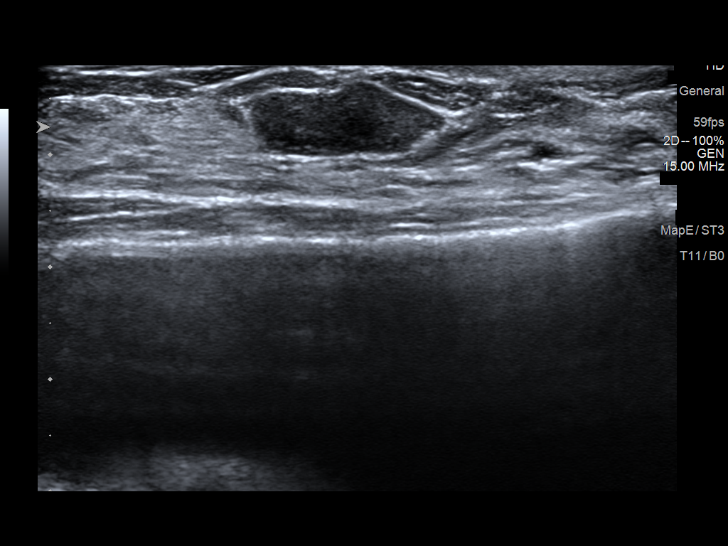
[im 2/10]
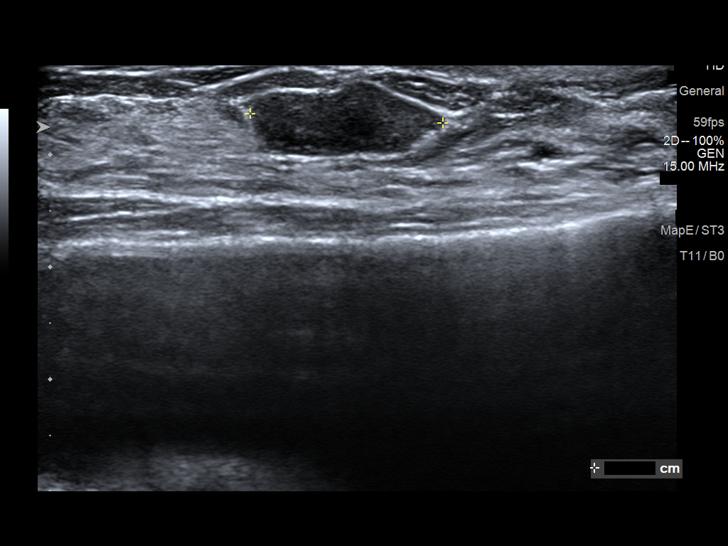
[im 3/10]
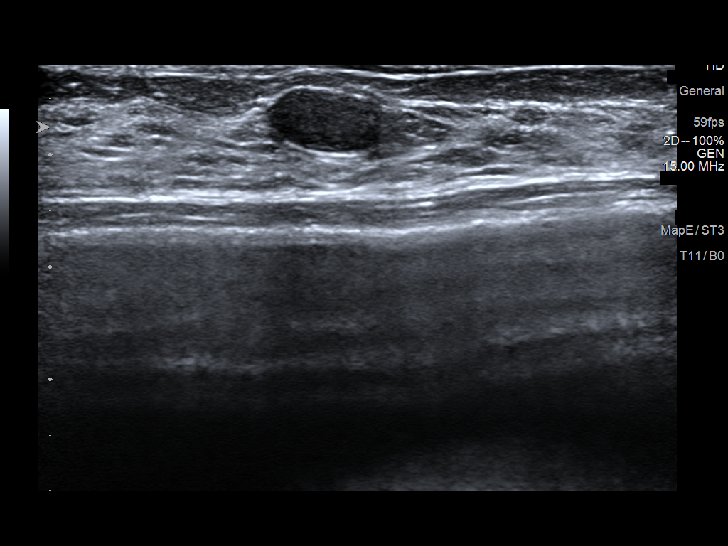
[im 4/10]
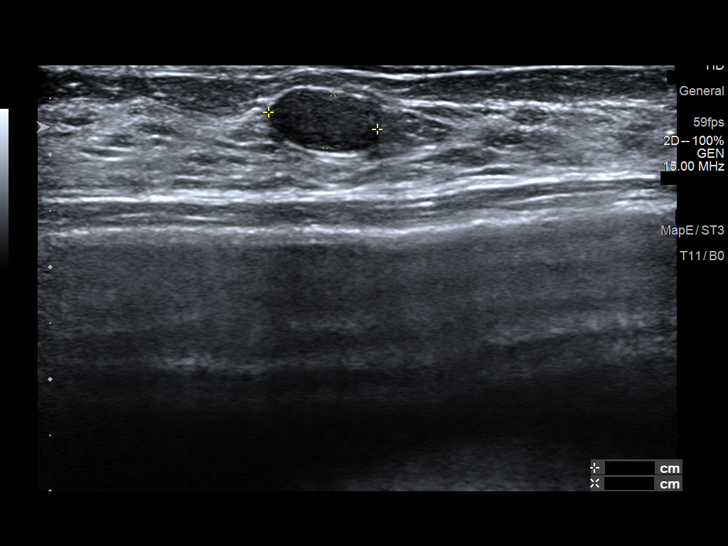
[im 5/10]
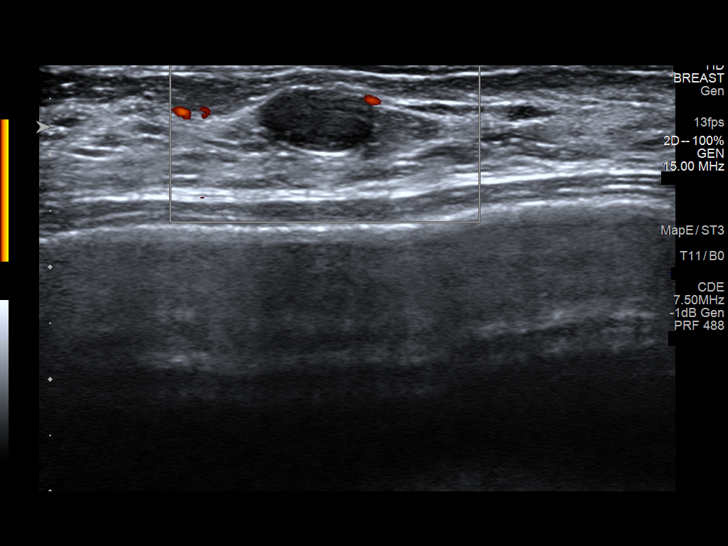
[im 6/10]
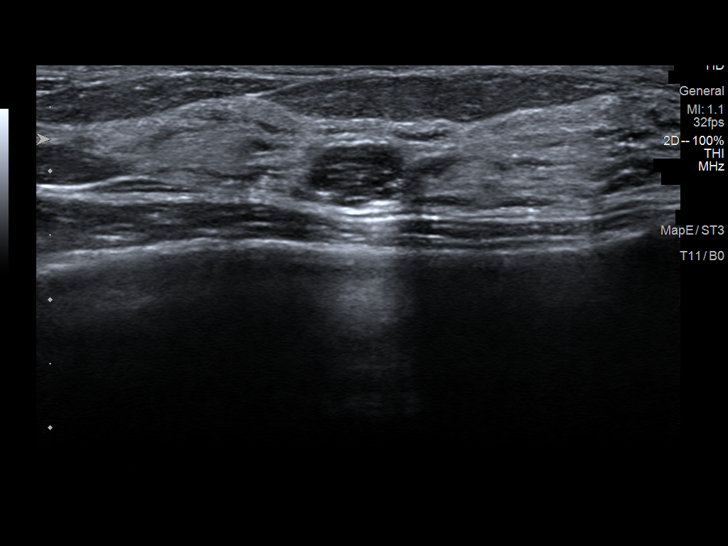
[im 7/10]
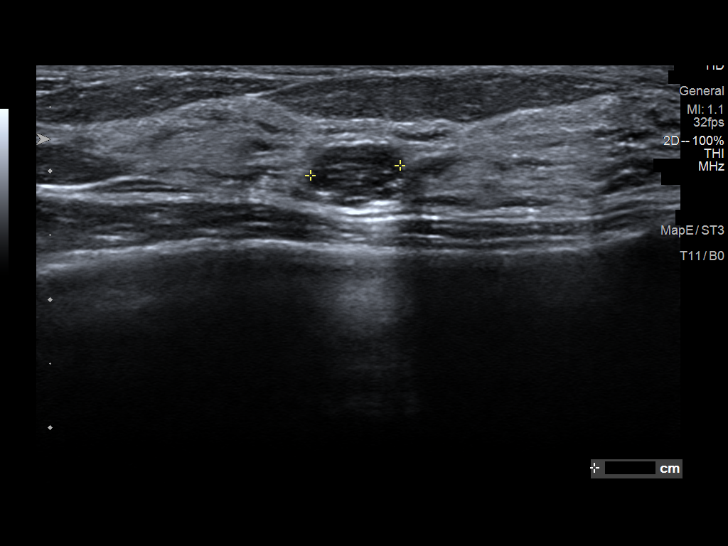
[im 8/10]
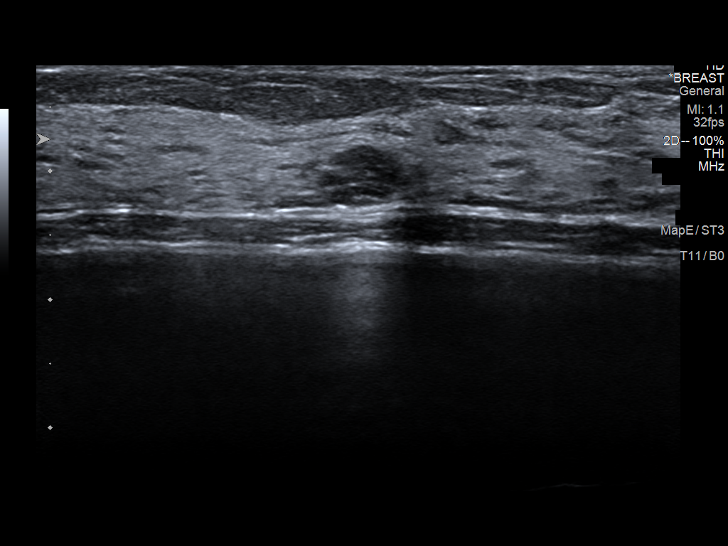
[im 9/10]
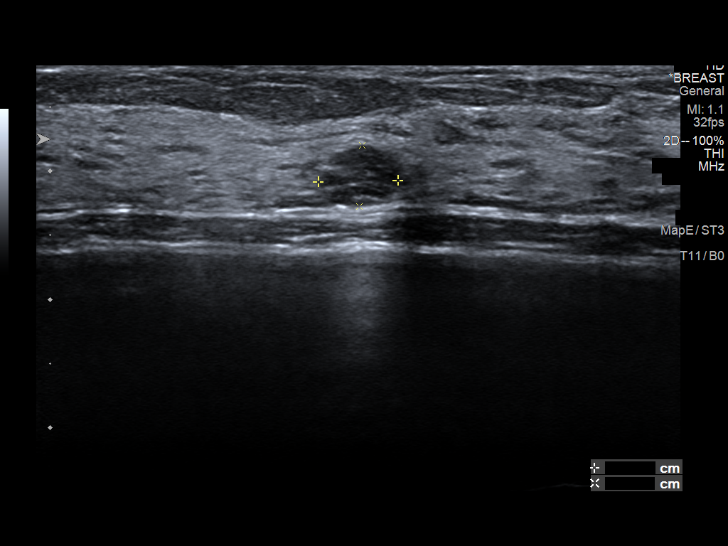
[im 10/10]
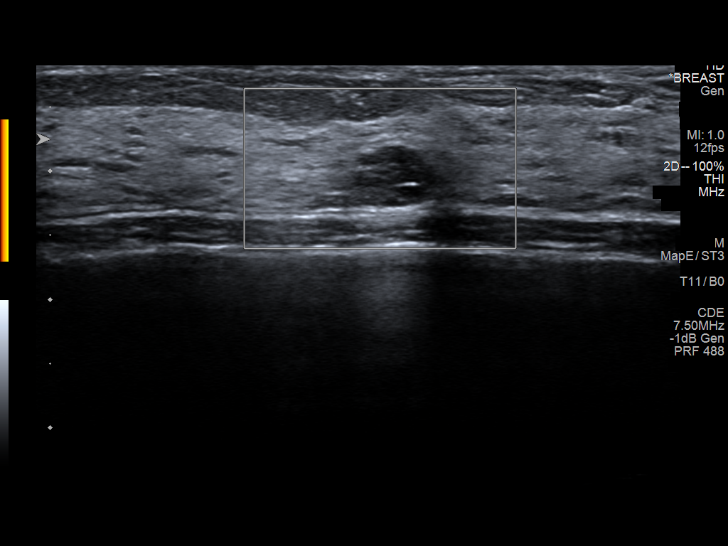

[10 of 10 positions shown; findings below may reference images not displayed]

ACR Breast Density Category b: There are scattered areas of
fibroglandular density.
FINDINGS: 2D/3D full field and spot compression views of the RIGHT breast
demonstrate a persistent circumscribed oval mass within the anterior
INNER RIGHT breast. The possible asymmetry within the UPPER RIGHT
breast disperses on spot compression views.

Mammographic images were processed with CAD.

Targeted ultrasound is performed, showing a 1 x 0.5 x 1.7 cm
circumscribed oval hypoechoic parallel mass at the 1 o'clock
position of the RIGHT breast 1 cm from the nipple and a 0.6 x 0.5 x
0.7 cm probable area of fibrocystic changes/microcysts at the [DATE]
position of the RIGHT breast 2 cm from the nipple.
IMPRESSION: 1. Likely benign 1.7 cm mass at the 1 o'clock position of the RIGHT
breast, probably a fibroadenoma. Likely benign 0.7 cm area of
fibrocystic changes at the [DATE] position of the RIGHT breast. We
discussed management options including ultrasound-guided core
biopsy, and close follow-up. Follow-up ultrasound is recommended at
6, 12, and 24 months to assess stability. The patient concurs with
this plan.

RECOMMENDATION:
RIGHT breast ultrasound in 6 months.

I have discussed the findings and recommendations with the patient.
If applicable, a reminder letter will be sent to the patient
regarding the next appointment.

BI-RADS CATEGORY  3: Probably benign.

## 2020-04-05 IMAGING — MG MM DIGITAL DIAGNOSTIC UNILAT*R* W/ TOMO W/ CAD
6 of 10 series · 6 of 30 positions shown · non-contrast
Comparison: Previous exam(s).

CLINICAL DATA: 39-year-old female for further evaluation of
possible RIGHT breast masses on baseline screening mammogram.

EXAM:
DIGITAL DIAGNOSTIC RIGHT MAMMOGRAM WITH CAD AND TOMO
ULTRASOUND RIGHT BREAST

[R CC synth-2D (1 of 2)]
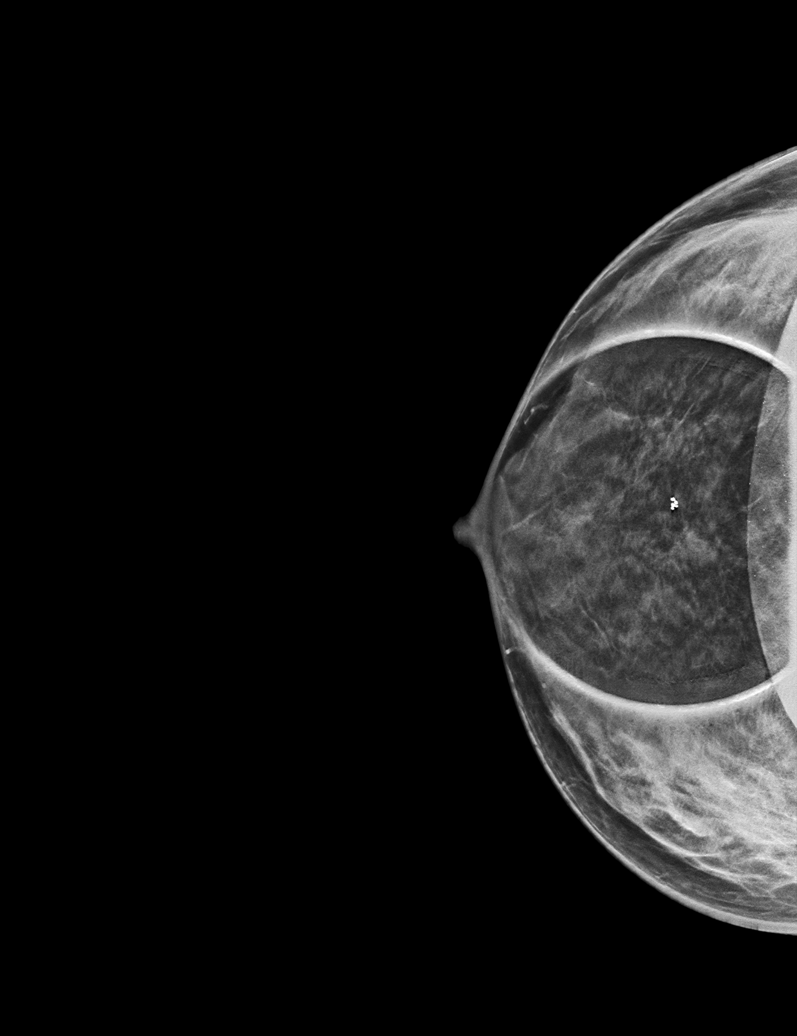

[R MLO synth-2D (1 of 2)]
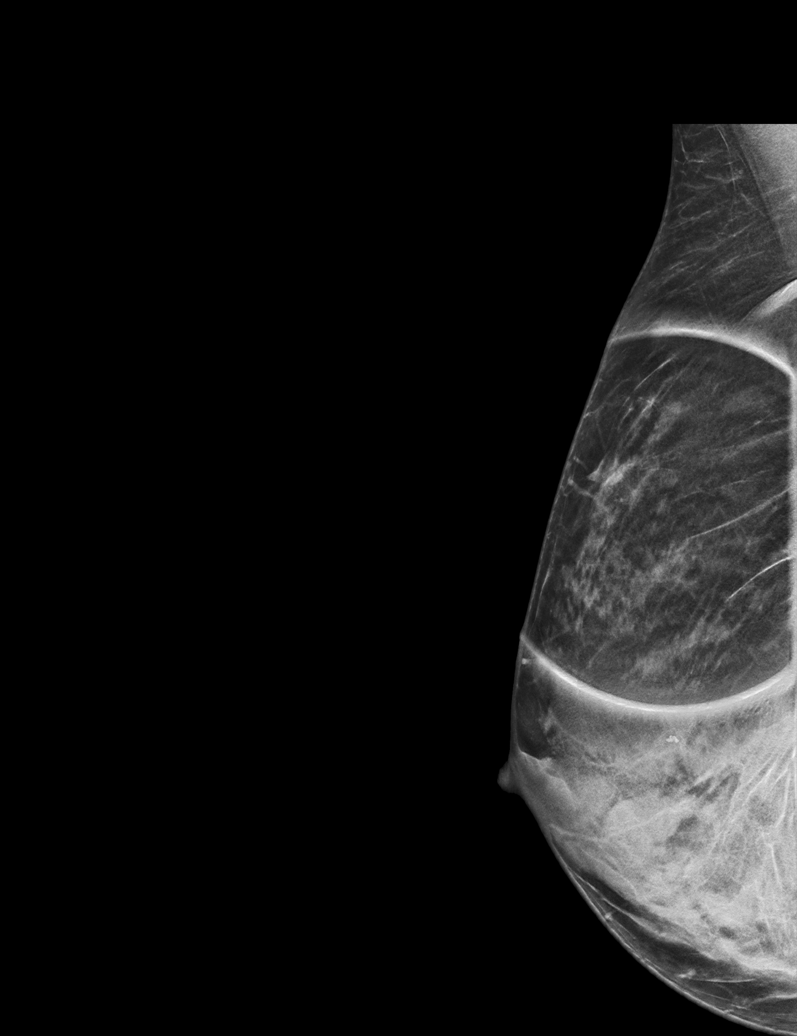

[R ML synth-2D]
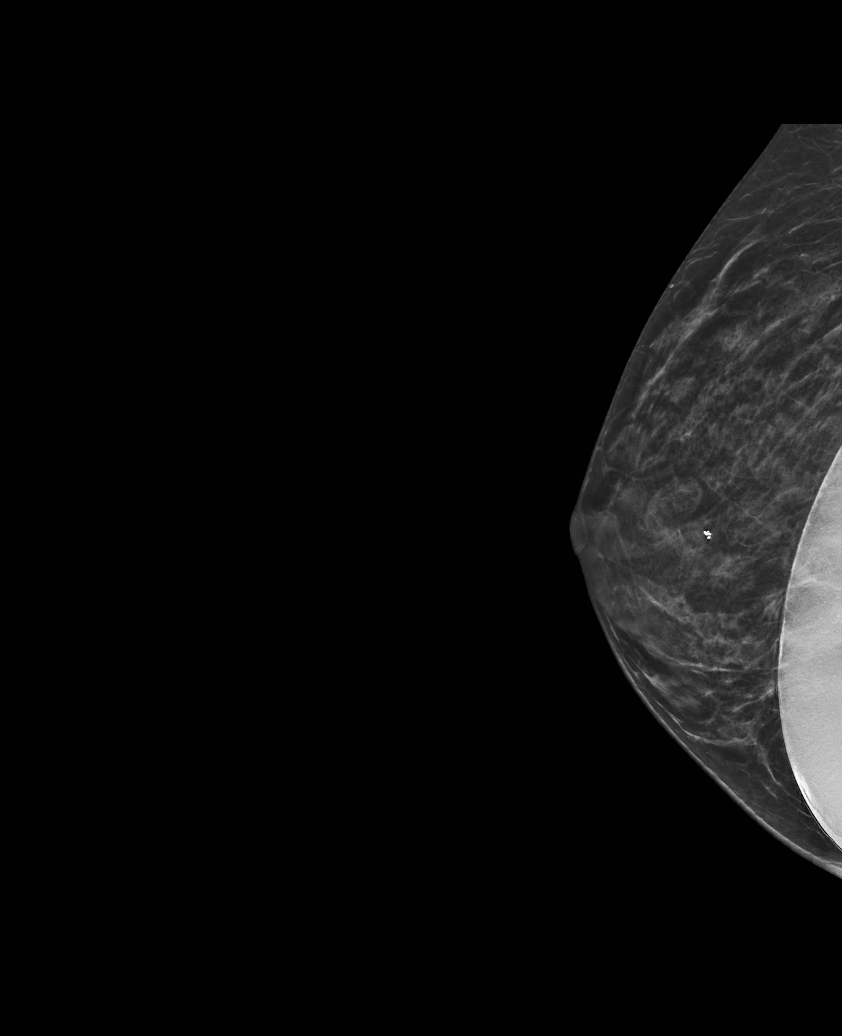

[R MLO synth-2D (2 of 2)]
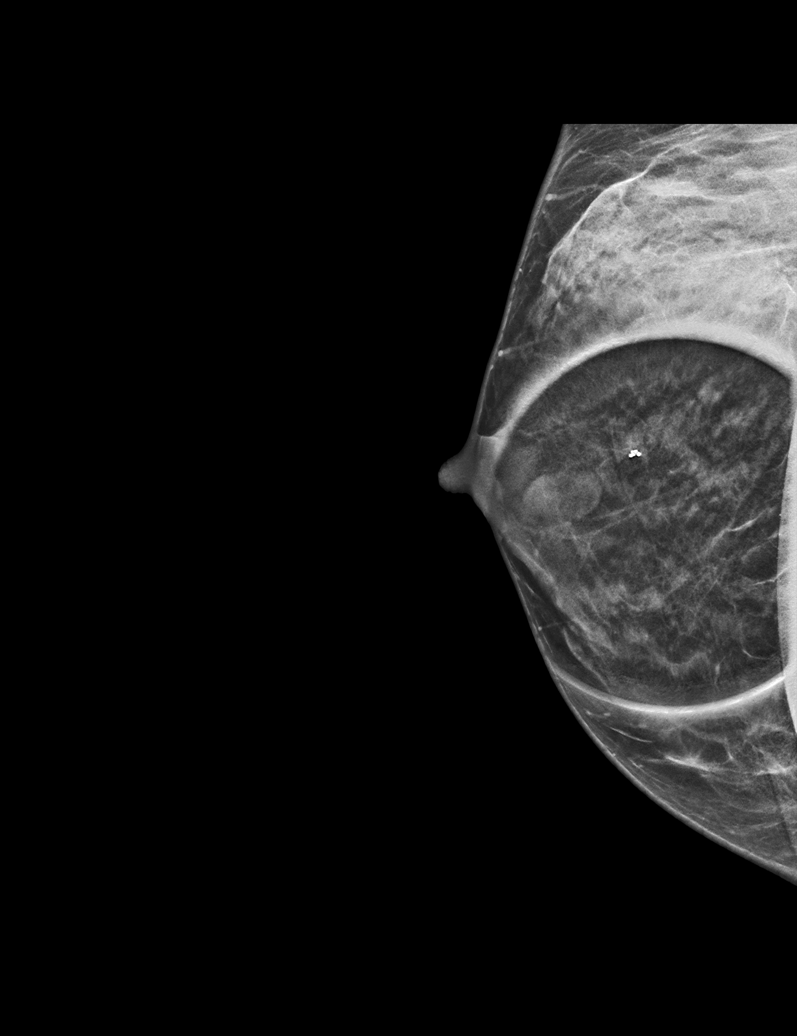

[R CC synth-2D (2 of 2)]
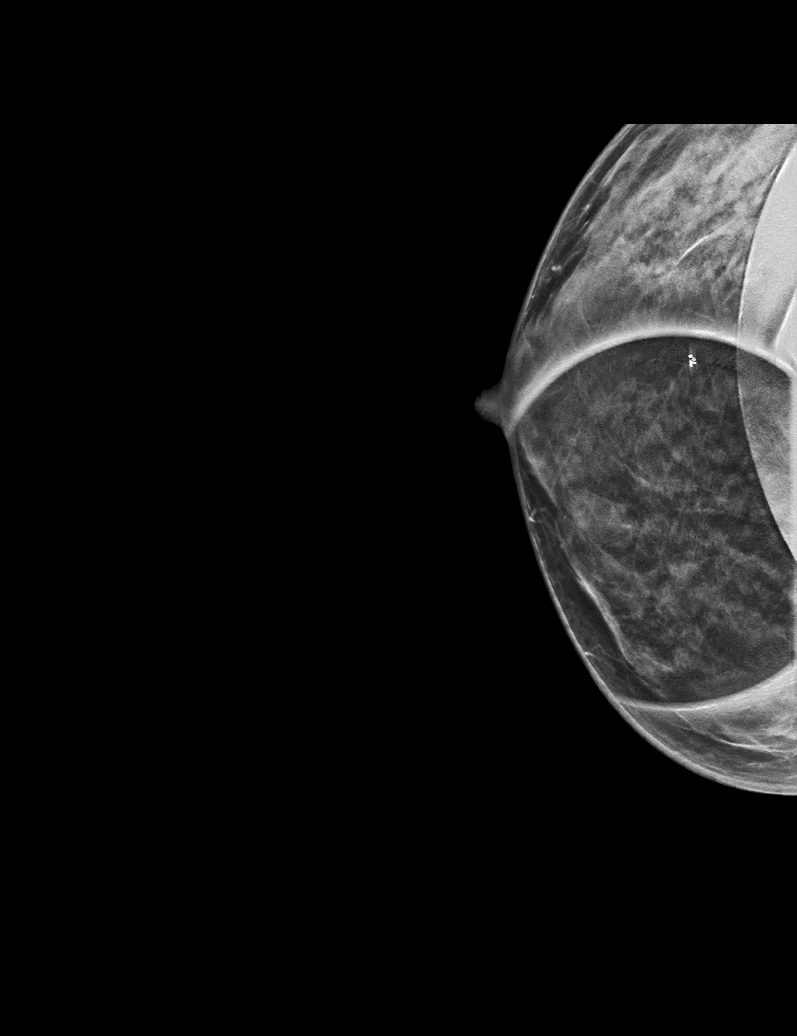

[R CC tomo · tomo slice 19/38.0]
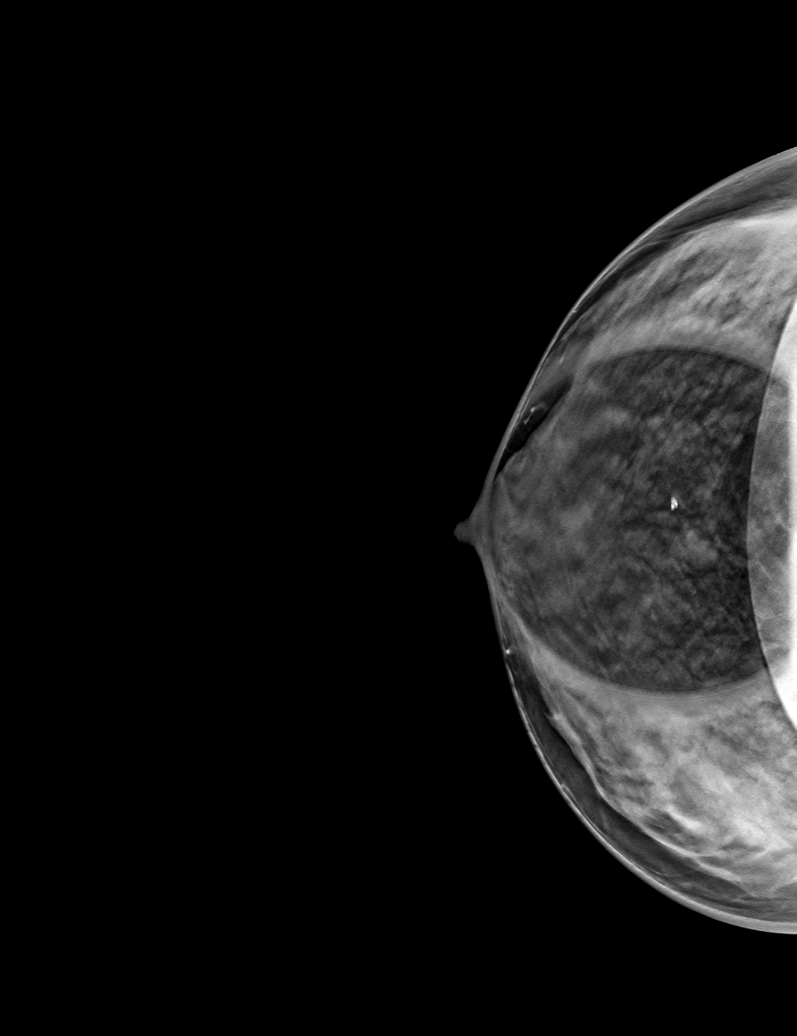

[6 of 30 positions shown; findings below may reference images not displayed]

ACR Breast Density Category b: There are scattered areas of
fibroglandular density.
FINDINGS: 2D/3D full field and spot compression views of the RIGHT breast
demonstrate a persistent circumscribed oval mass within the anterior
INNER RIGHT breast. The possible asymmetry within the UPPER RIGHT
breast disperses on spot compression views.

Mammographic images were processed with CAD.

Targeted ultrasound is performed, showing a 1 x 0.5 x 1.7 cm
circumscribed oval hypoechoic parallel mass at the 1 o'clock
position of the RIGHT breast 1 cm from the nipple and a 0.6 x 0.5 x
0.7 cm probable area of fibrocystic changes/microcysts at the [DATE]
position of the RIGHT breast 2 cm from the nipple.
IMPRESSION: 1. Likely benign 1.7 cm mass at the 1 o'clock position of the RIGHT
breast, probably a fibroadenoma. Likely benign 0.7 cm area of
fibrocystic changes at the [DATE] position of the RIGHT breast. We
discussed management options including ultrasound-guided core
biopsy, and close follow-up. Follow-up ultrasound is recommended at
6, 12, and 24 months to assess stability. The patient concurs with
this plan.

RECOMMENDATION:
RIGHT breast ultrasound in 6 months.

I have discussed the findings and recommendations with the patient.
If applicable, a reminder letter will be sent to the patient
regarding the next appointment.

BI-RADS CATEGORY  3: Probably benign.

## 2020-05-11 ENCOUNTER — Ambulatory Visit: Payer: 59 | Admitting: Obstetrics & Gynecology

## 2020-05-16 ENCOUNTER — Encounter: Payer: Self-pay | Admitting: Obstetrics & Gynecology

## 2020-05-16 ENCOUNTER — Telehealth: Payer: Self-pay

## 2020-05-16 MED ORDER — FLUCONAZOLE 150 MG PO TABS
150.0000 mg | ORAL_TABLET | Freq: Once | ORAL | 0 refills | Status: AC
Start: 2020-05-16 — End: 2020-05-16

## 2020-05-16 NOTE — Telephone Encounter (Signed)
Thank you for telling her we do not typically treat this over the phone.  Ok to give diflucan 150mg  po x 1 and sent to pharmacy.  If not resolved, will need OV.

## 2020-05-16 NOTE — Telephone Encounter (Signed)
[name] you had a great 4th!   I wanted to see if I could get diflucan called in. I'm getting a start to a yeast infection. My pharmacy is [facility] [address]   Thx!

## 2020-05-16 NOTE — Telephone Encounter (Signed)
AEX 03/27/20 H/o yeast infections per pt.   Spoke with pt. Pt reports having yeast infection sx of burning, white thick discharge and itching since last night. Pt denies any vaginal odor, bleeding, fever, chills, abd pain and all UTI sx.   Pt requesting Diflucan Rx. Pt advised that we normally don't  treat over the phone. Pt verbalized understanding. Advised will review with Dr Sabra Heck and return call to pt. Pt agreeable.   Routing to Dr Sabra Heck. Pharmacy verified.

## 2020-05-16 NOTE — Telephone Encounter (Signed)
Left detailed message for pt about Rx sent to pharmacy on file. Ok per [employer].  Rx Diflucan 150 mg # 1, 0RF sent.  Pt to return call with any questions about Rx and if needs OV for unresolved sx.   Routing to Dr Sabra Heck Encounter closed.

## 2020-10-08 ENCOUNTER — Other Ambulatory Visit: Payer: Self-pay

## 2020-10-08 ENCOUNTER — Ambulatory Visit
Admission: RE | Admit: 2020-10-08 | Discharge: 2020-10-08 | Disposition: A | Discharge status: Home / Self Care | Payer: 59 | Source: Ambulatory Visit | Attending: Obstetrics & Gynecology | Admitting: Obstetrics & Gynecology

## 2020-10-08 ENCOUNTER — Other Ambulatory Visit: Payer: Self-pay | Admitting: Obstetrics & Gynecology

## 2020-10-08 DIAGNOSIS — R928 Other abnormal and inconclusive findings on diagnostic imaging of breast: Secondary | ICD-10-CM

## 2020-10-08 IMAGING — US US BREAST*R* LIMITED INC AXILLA
1 series · 9 of 9 positions shown · non-contrast
Comparison: Previous exam(s).

CLINICAL DATA: First six-month follow-up for probably benign
findings in the RIGHT breast.

EXAM:
ULTRASOUND OF THE RIGHT BREAST

[Series 1: us breast*right* limited inc axilla · 0.06mm/px · 9 of 9 slices shown]
[im 1/9]
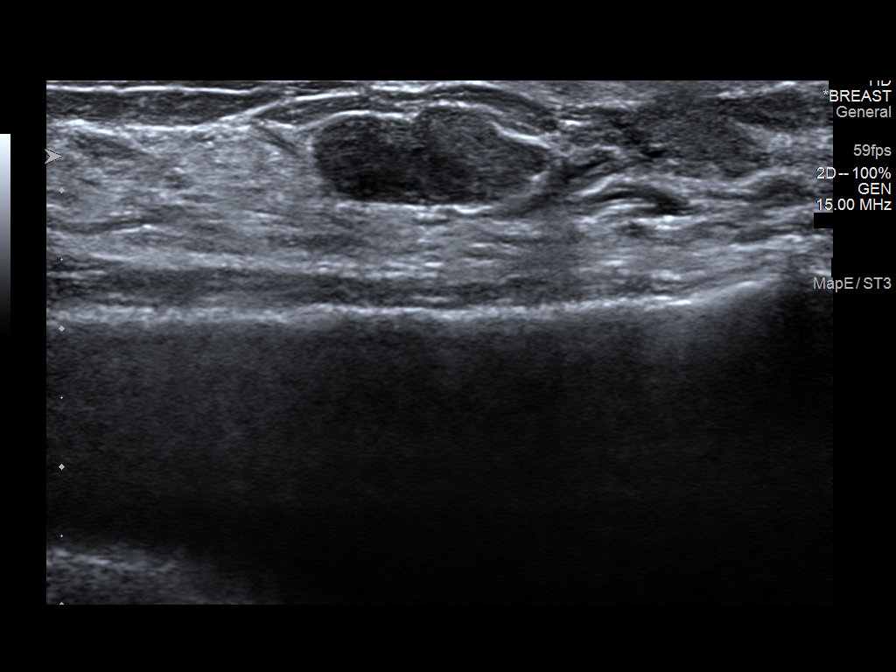
[im 2/9]
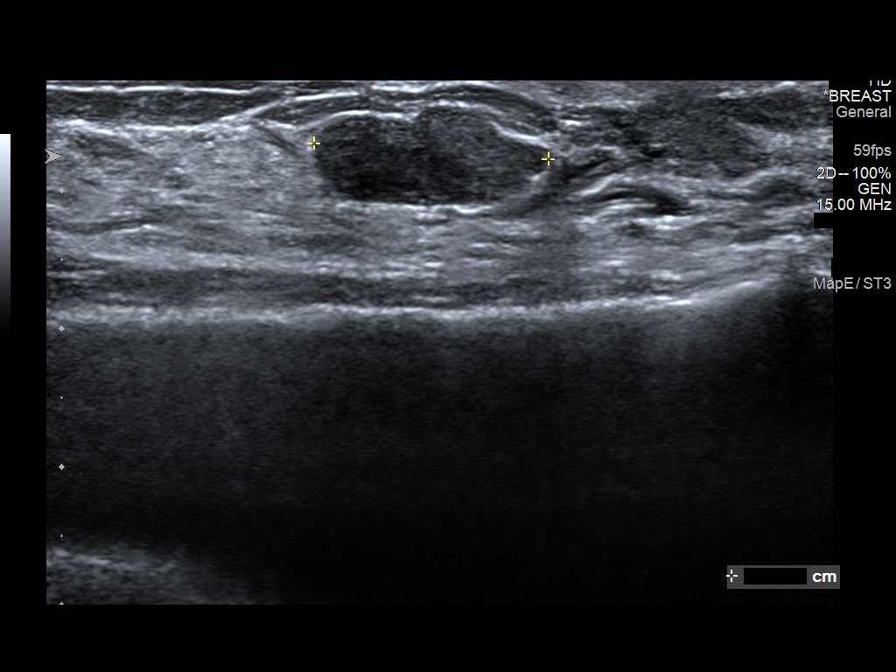
[im 3/9]
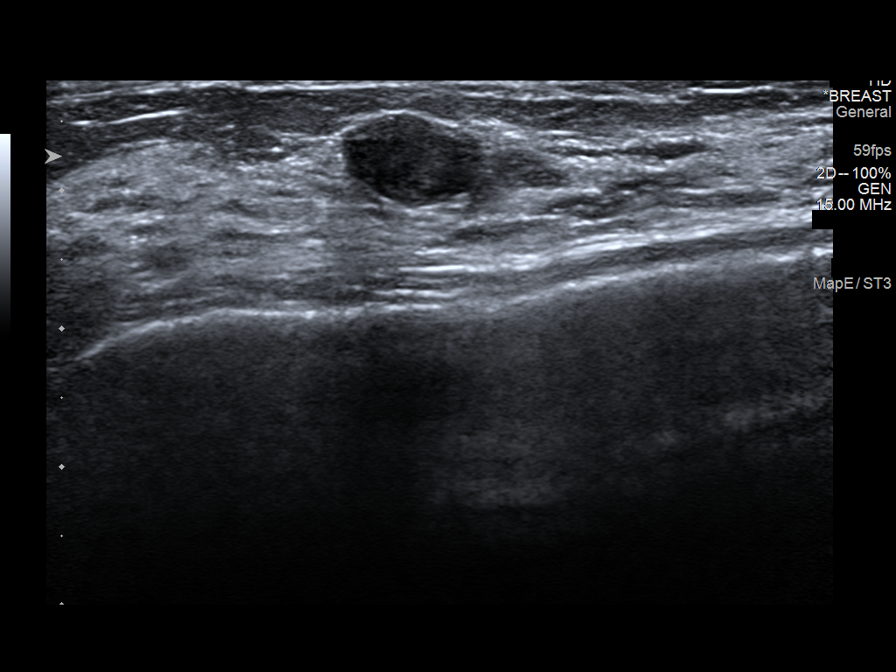
[im 4/9]
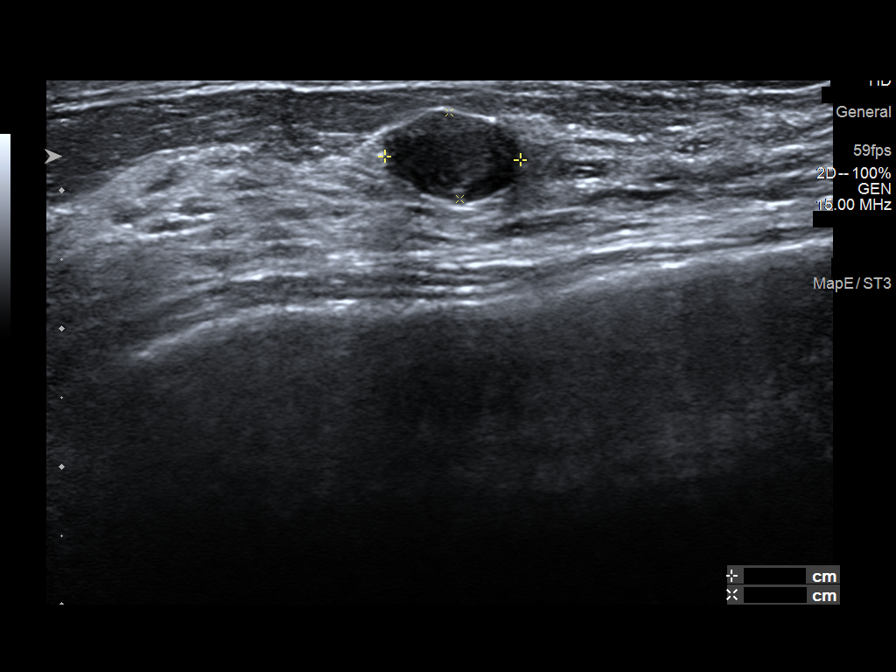
[im 5/9]
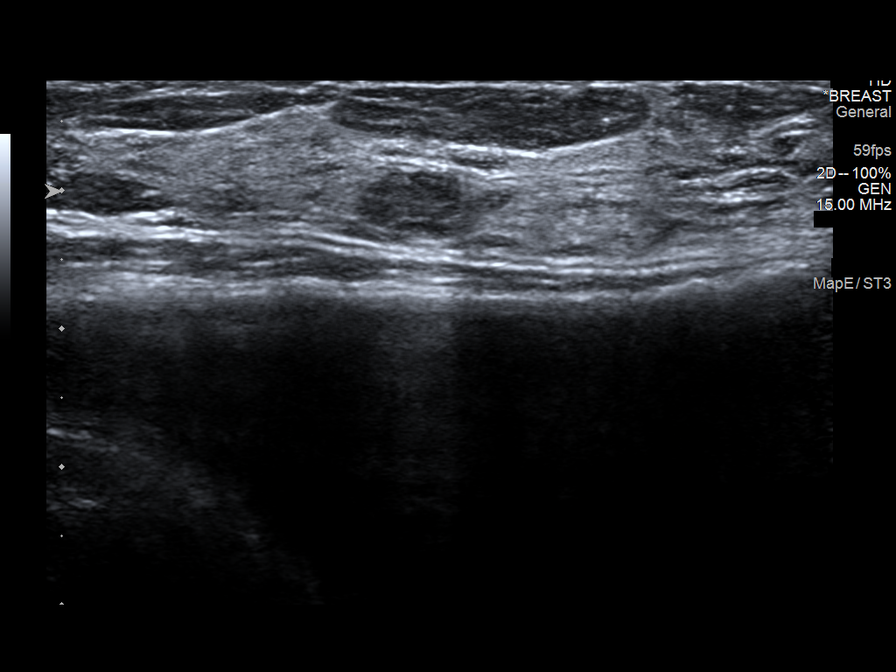
[im 6/9]
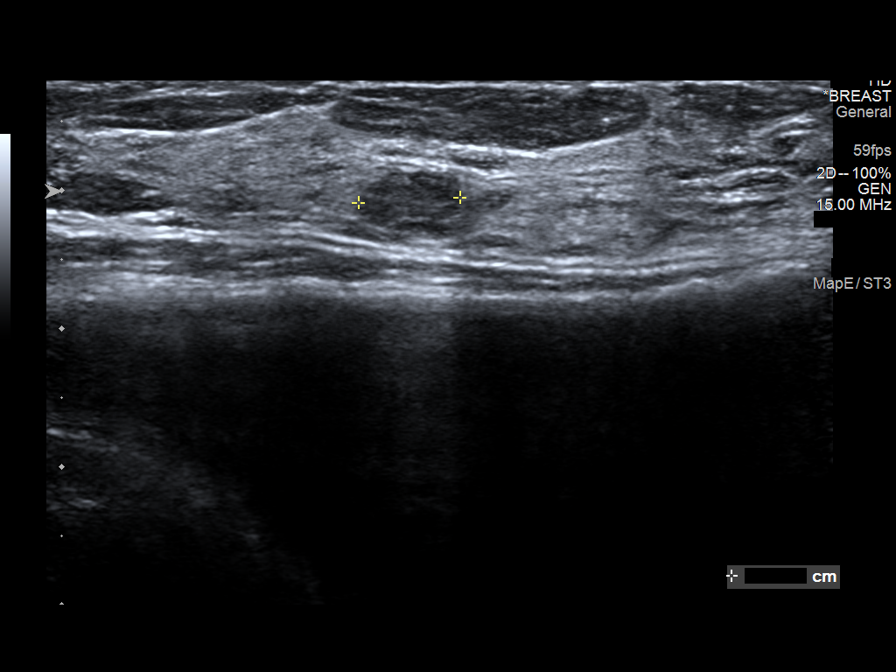
[im 7/9]
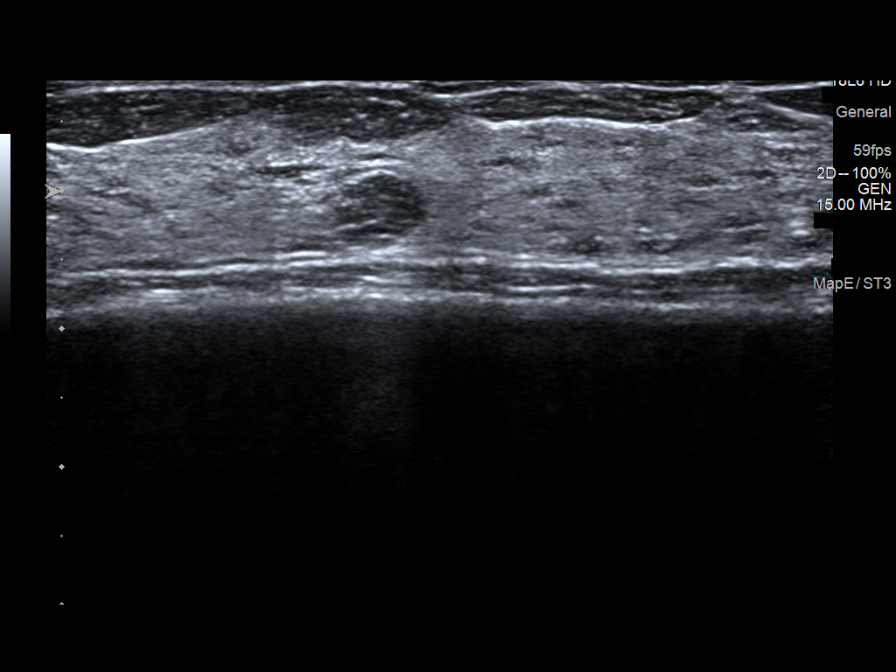
[im 8/9]
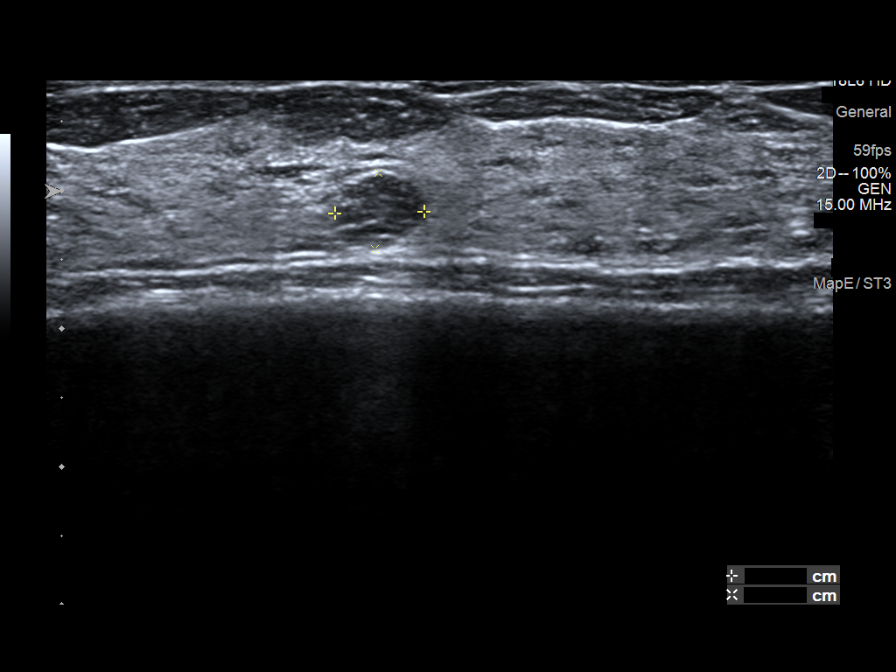
[im 9/9]
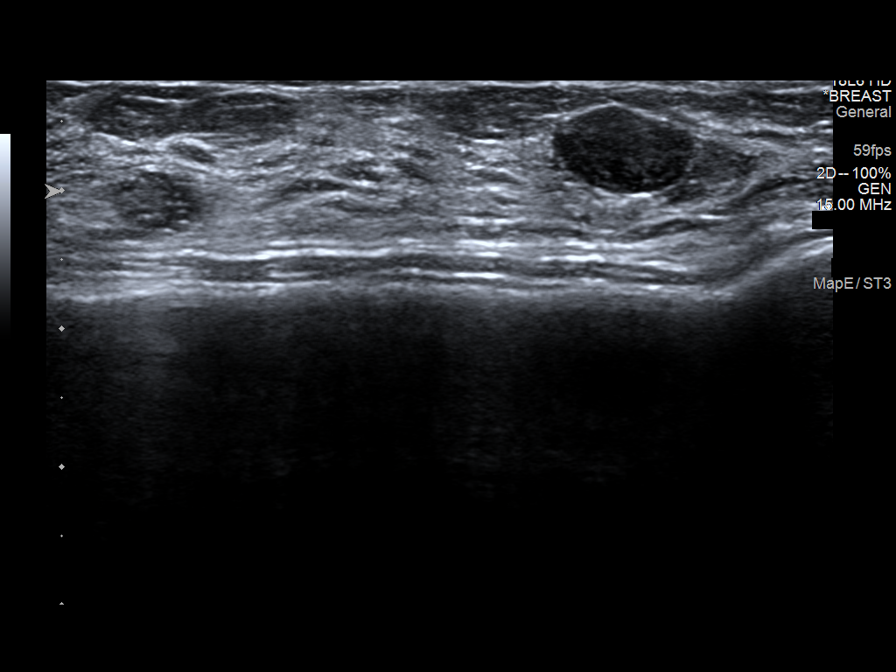

[9 of 9 positions shown; findings below may reference images not displayed]

FINDINGS: Targeted ultrasound is performed, showing circumscribed oval
hypoechoic mass in the 1 o'clock location of the RIGHT breast 1
centimeter from the nipple which measures 1.7 x 1.0 x
centimeters.

In the 11:30 o'clock location 2 centimeters from the nipple, an oval
circumscribed hypoechoic mass is 0.7 x 0.7 x 0.6 centimeters.
IMPRESSION: Stable appearance of probably benign masses in the RIGHT breast.

RECOMMENDATION:
Recommend bilateral diagnostic mammogram and RIGHT breast ultrasound
in 6 months.

I have discussed the findings and recommendations with the patient.
If applicable, a reminder letter will be sent to the patient
regarding the next appointment.

BI-RADS CATEGORY  3: Probably benign.

## 2021-04-04 ENCOUNTER — Other Ambulatory Visit (HOSPITAL_COMMUNITY)
Admission: RE | Admit: 2021-04-04 | Discharge: 2021-04-04 | Disposition: A | Discharge status: Home / Self Care | Payer: 59 | Source: Ambulatory Visit | Attending: Obstetrics & Gynecology | Admitting: Obstetrics & Gynecology

## 2021-04-04 ENCOUNTER — Encounter (HOSPITAL_BASED_OUTPATIENT_CLINIC_OR_DEPARTMENT_OTHER): Payer: Self-pay | Admitting: Obstetrics & Gynecology

## 2021-04-04 ENCOUNTER — Other Ambulatory Visit (HOSPITAL_BASED_OUTPATIENT_CLINIC_OR_DEPARTMENT_OTHER)
Admission: RE | Admit: 2021-04-04 | Discharge: 2021-04-04 | Disposition: A | Discharge status: Home / Self Care | Payer: 59 | Source: Ambulatory Visit | Attending: Nurse Practitioner | Admitting: Nurse Practitioner

## 2021-04-04 ENCOUNTER — Other Ambulatory Visit: Payer: Self-pay

## 2021-04-04 ENCOUNTER — Ambulatory Visit (INDEPENDENT_AMBULATORY_CARE_PROVIDER_SITE_OTHER): Payer: 59 | Admitting: Obstetrics & Gynecology

## 2021-04-04 VITALS — BP 126/85 | HR 62 | Ht 68.25 in | Wt 166.0 lb

## 2021-04-04 DIAGNOSIS — Z87410 Personal history of cervical dysplasia: Secondary | ICD-10-CM | POA: Diagnosis not present

## 2021-04-04 DIAGNOSIS — Z113 Encounter for screening for infections with a predominantly sexual mode of transmission: Secondary | ICD-10-CM | POA: Insufficient documentation

## 2021-04-04 DIAGNOSIS — Z30430 Encounter for insertion of intrauterine contraceptive device: Secondary | ICD-10-CM

## 2021-04-04 DIAGNOSIS — Z975 Presence of (intrauterine) contraceptive device: Secondary | ICD-10-CM

## 2021-04-04 DIAGNOSIS — Z01419 Encounter for gynecological examination (general) (routine) without abnormal findings: Secondary | ICD-10-CM

## 2021-04-04 LAB — HEPATITIS C ANTIBODY: HCV Ab: NONREACTIVE

## 2021-04-04 LAB — HEPATITIS B SURFACE ANTIGEN: Hepatitis B Surface Ag: NONREACTIVE

## 2021-04-04 LAB — HIV ANTIBODY (ROUTINE TESTING W REFLEX): HIV Screen 4th Generation wRfx: NONREACTIVE
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
## 2021-04-05 DIAGNOSIS — Z87410 Personal history of cervical dysplasia: Secondary | ICD-10-CM | POA: Insufficient documentation

## 2021-04-05 LAB — CERVICOVAGINAL ANCILLARY ONLY
Chlamydia: NEGATIVE
Comment: NEGATIVE
Comment: NEGATIVE
Comment: NORMAL
Neisseria Gonorrhea: NEGATIVE
Trichomonas: NEGATIVE

## 2021-04-05 LAB — POCT URINE PREGNANCY: Preg Test, Ur: NEGATIVE

## 2021-04-05 LAB — RPR: RPR Ser Ql: NONREACTIVE

## 2021-04-05 NOTE — Progress Notes (Signed)
Opened in error

## 2021-04-05 NOTE — Patient Instructions (Signed)
Intrauterine Device Insertion, Care After This sheet gives you information about how to care for yourself after your procedure. Your health care provider may also give you more specific instructions. If you have problems or questions, contact your health care provider. What can I expect after the procedure? After the procedure, it is common to have:  Cramps and pain in the abdomen.  Bleeding. It may be light or heavy. This may last for a few days.  Lower back pain.  Dizziness.  Headaches.  Nausea. Follow these instructions at home:  Before resuming sexual activity, check to make sure that you can feel the IUD string or strings. You should be able to feel the end of the string below the opening of your cervix. If your IUD string is in place, you may resume sexual activity. ? If you had a hormonal IUD inserted more than 7 days after your most recent period started, you will need to use a backup method of birth control for 7 days after IUD insertion. Ask your health care provider whether this applies to you.  Continue to check that the IUD is still in place by feeling for the strings after every menstrual period, or once a month.  An IUD will not protect you from sexually transmitted infections (STIs). Use methods to prevent the exchange of body fluids between partners (barrier protection) every time you have sex. Barrier protection can be used during oral, vaginal, or anal sex. Commonly used barrier methods include: ? Female condom. ? Female condom. ? Dental dam.  Take over-the-counter and prescription medicines only as told by your health care provider.  Keep all follow-up visits as told by your health care provider. This is important.   Contact a health care provider if:  You feel light-headed or weak.  You have any of the following problems with your IUD string or strings: ? The string bothers or hurts you or your sexual partner. ? You cannot feel the string. ? The string has  gotten longer.  You can feel the IUD in your vagina.  You think you may be pregnant, or you miss your menstrual period.  You think you may have a sexually transmitted infection (STI). Get help right away if:  You have flu-like symptoms, such as tiredness (fatigue) and muscle aches.  You have a fever and chills.  You have bleeding that is heavier or lasts longer than a normal menstrual cycle.  You have abnormal or bad-smelling discharge from your vagina.  You develop abdominal pain that is new, is getting worse, or is not in the same area of earlier cramping and pain.  You have pain during sexual activity. Summary  After the procedure, it is common to have cramps and pain in the abdomen. It is also common to have light bleeding or heavier bleeding that is like your menstrual period.  Continue to check that the IUD is still in place by feeling for the strings after every menstrual period, or once a month.  Keep all follow-up visits as told by your health care provider. This is important.  Contact your health care provider if you have problems with your IUD strings, such as the string getting longer or bothering you or your sexual partner. This information is not intended to replace advice given to you by your health care provider. Make sure you discuss any questions you have with your health care provider. Document Revised: 10/18/2019 Document Reviewed: 10/18/2019 Elsevier Patient Education  [place].

## 2021-04-09 ENCOUNTER — Ambulatory Visit
Admission: RE | Admit: 2021-04-09 | Discharge: 2021-04-09 | Disposition: A | Discharge status: Home / Self Care | Payer: 59 | Source: Ambulatory Visit | Attending: Obstetrics & Gynecology | Admitting: Obstetrics & Gynecology

## 2021-04-09 ENCOUNTER — Other Ambulatory Visit: Payer: Self-pay

## 2021-04-09 DIAGNOSIS — R928 Other abnormal and inconclusive findings on diagnostic imaging of breast: Secondary | ICD-10-CM

## 2021-04-09 IMAGING — MG MM  DIGITAL DIAGNOSTIC BREAST BILAT IMPLANT W/ TOMO W/ CAD
8 of 12 series · 8 of 28 positions shown · non-contrast
Comparison: Previous exam(s).

CLINICAL DATA: One year interval follow-up of likely benign RIGHT
breast masses, likely fibroadenomas, identified on baseline
screening mammography. Annual evaluation, LEFT breast.

EXAM:
DIGITAL DIAGNOSTIC BILATERAL MAMMOGRAM WITH IMPLANTS, CAD AND
TOMOSYNTHESIS; ULTRASOUND RIGHT BREAST LIMITED
TECHNIQUE: Bilateral digital diagnostic mammography and breast tomosynthesis
was performed. The images were evaluated with computer-aided
detection. Standard and/or implant displaced views were performed.;
Targeted ultrasound examination of the right breast was performed

[L CC]
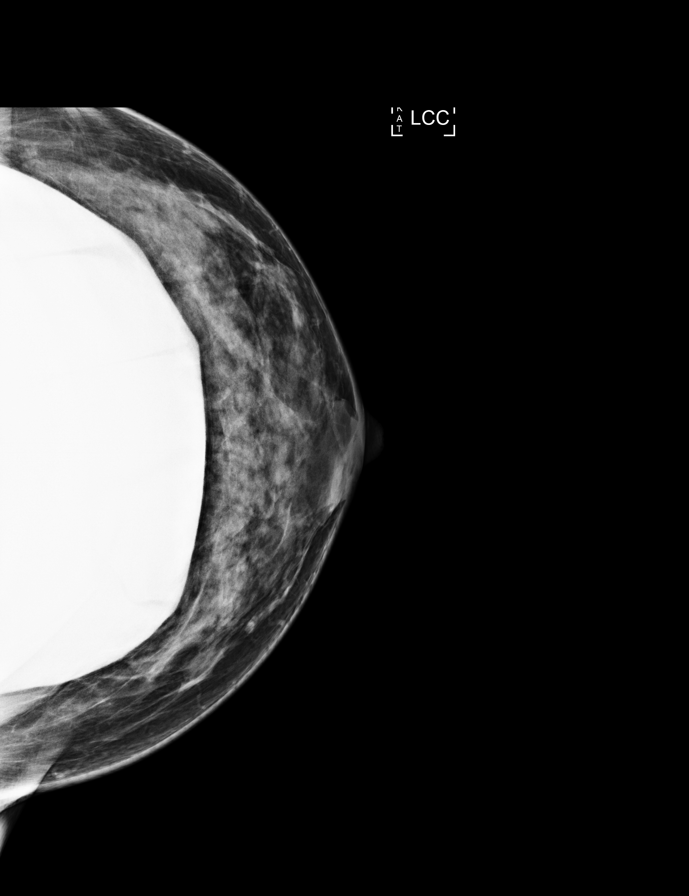

[L MLO]
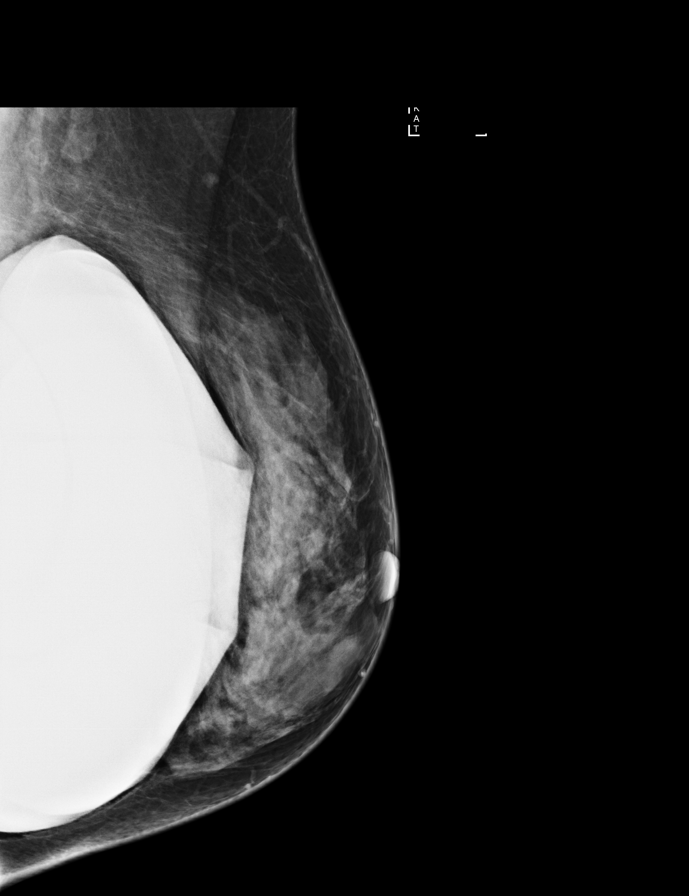

[R MLO]
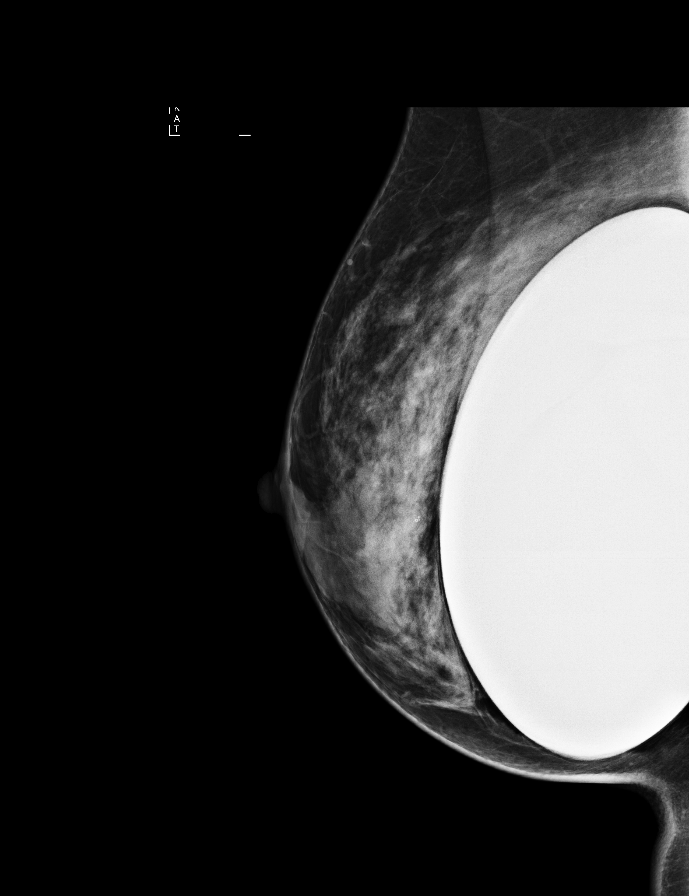

[R CC]
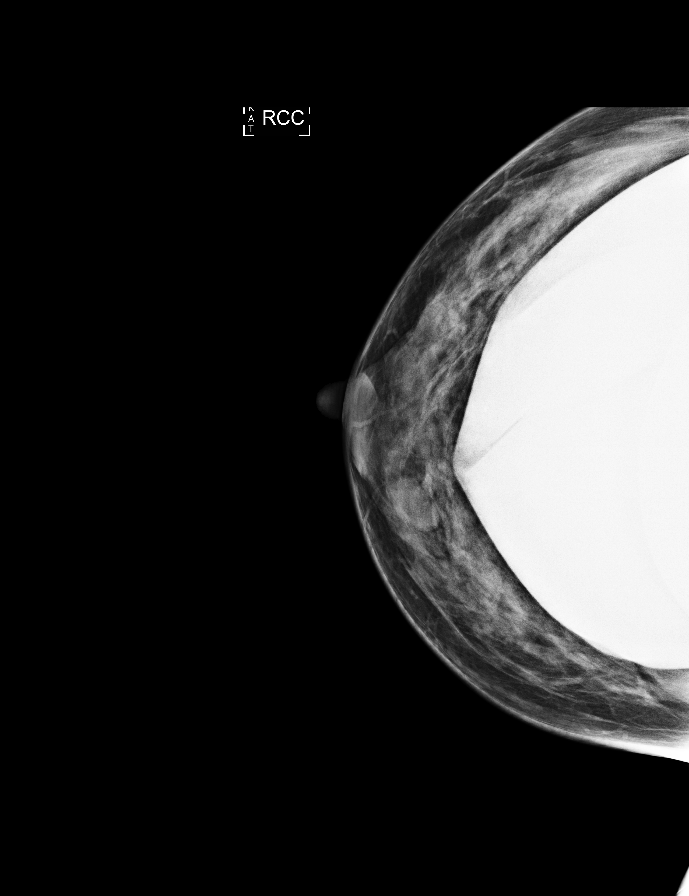

[L CC synth-2D]
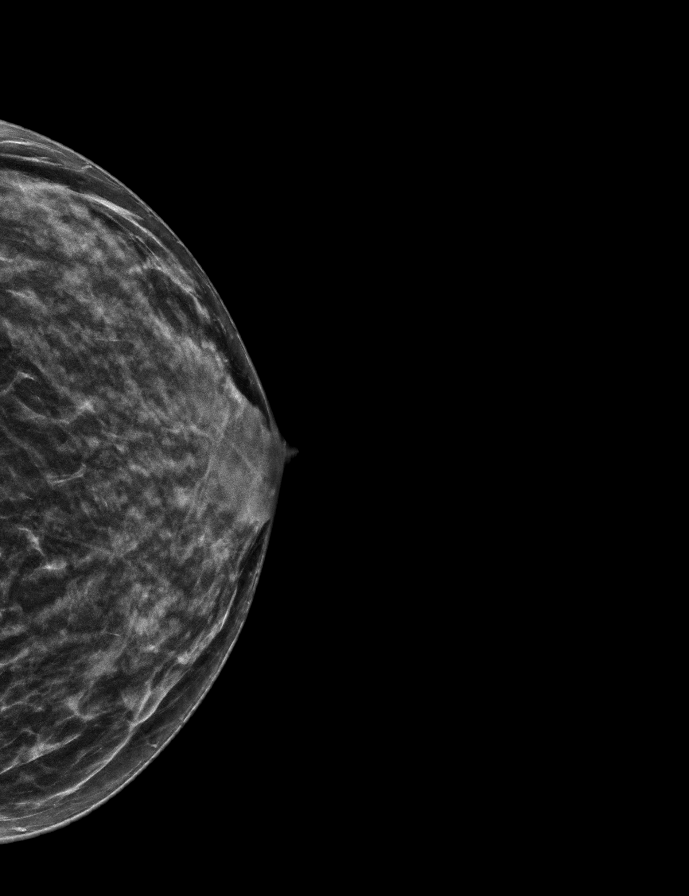

[R MLO synth-2D]
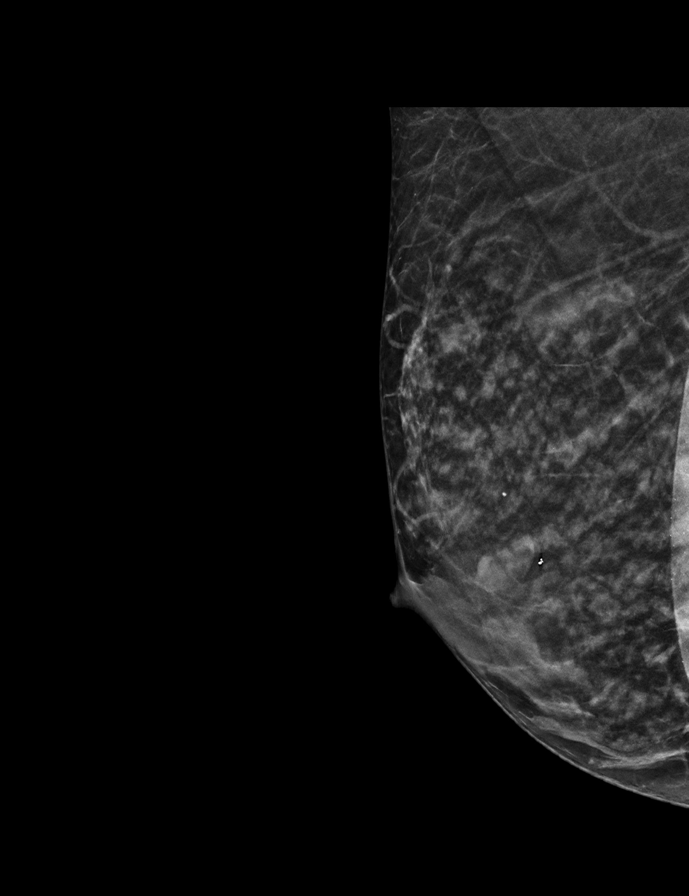

[R CC synth-2D]
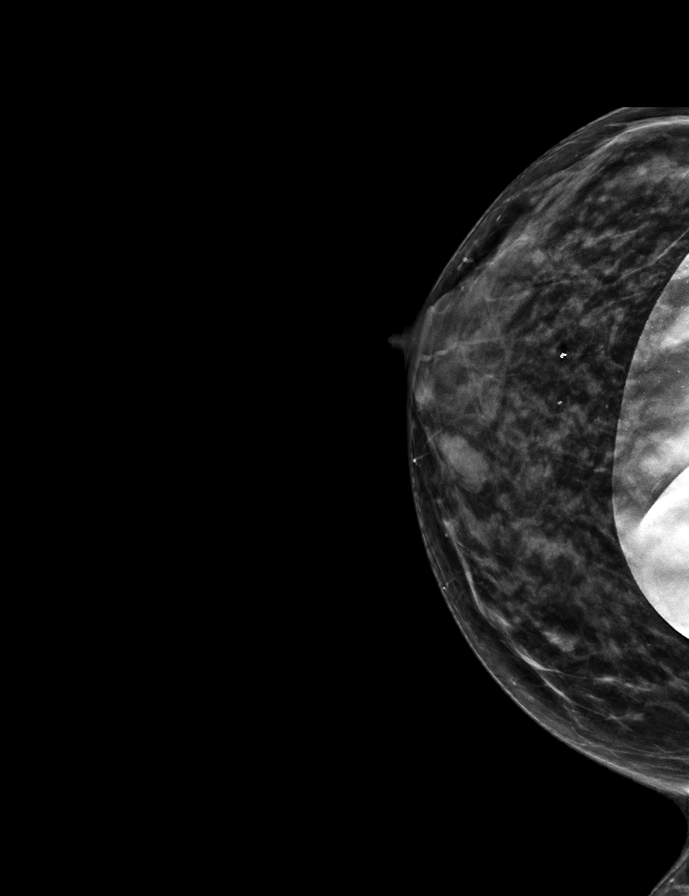

[L MLO synth-2D]
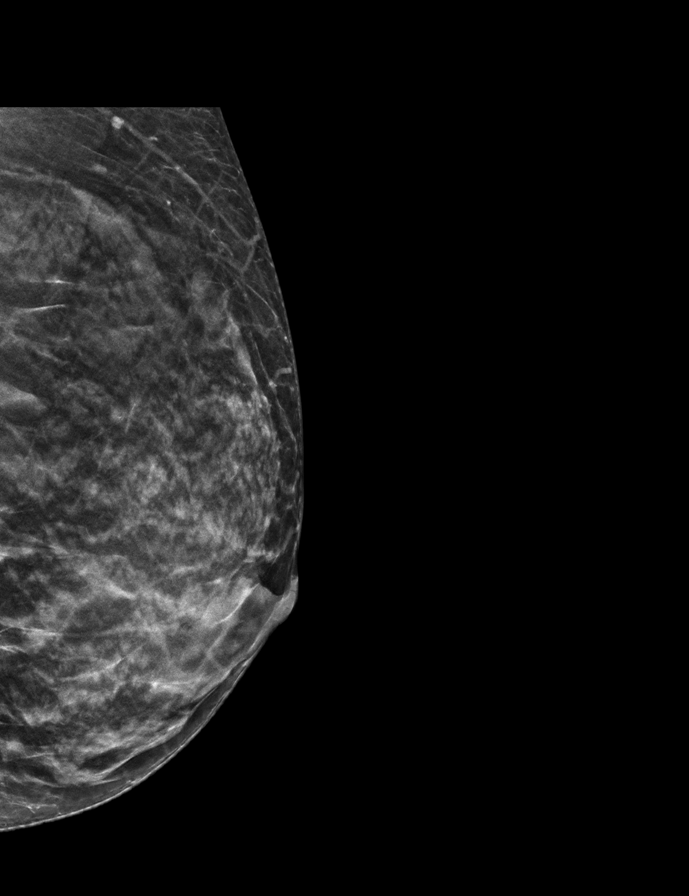

[8 of 28 positions shown; findings below may reference images not displayed]

ACR Breast Density Category c: The breast tissue is heterogeneously
dense, which may obscure small masses.
FINDINGS: Field CC and MLO views and implant displaced CC and MLO views with
tomosynthesis were obtained. The patient has retropectoral implants.

RIGHT: The circumscribed isodense mass in the upper inner subareolar
location is unchanged, measuring approximately 1.7 cm. There is no
associated architectural distortion or suspicious calcifications.

The partially obscured isodense mass in upper breast is also
unchanged, measuring less than 1 cm, most conspicuous on the MLO
view. There is no associated architectural distortion or suspicious
calcifications.

No new or suspicious findings elsewhere.

Targeted ultrasound is performed, demonstrating the previously
identified circumscribed oval parallel hypoechoic mass at the 1
o'clock position 1 cm from nipple at anterior depth, measuring
approximately 1.7 x 0.6 x 1.0 cm (previously 1.7 x 0.6 x 1.0 cm on
the [DATE] ultrasound and 1.7 x 0.5 x 1.0 cm on the [DATE]
ultrasound), demonstrating no posterior characteristics and
demonstrating peripheral power Doppler flow.

At the 11:30 o'clock position approximately 2 cm from the nipple at
middle to posterior depth is the previously identified circumscribed
oval parallel hypoechoic mass measuring approximately 0.7 x 0.5 x
0.6 cm (previously 0.7 x 0.6 x 0.7 cm on the [DATE] ultrasound
and 0.7 x 0.5 x 0.6 cm on the [DATE] ultrasound), demonstrating
posterior acoustic enhancement and no internal power Doppler flow.

LEFT: No findings suspicious for malignancy.
IMPRESSION: 1. Stable likely benign masses in the RIGHT breast, likely
fibroadenomas.
2. No mammographic evidence of malignancy involving the LEFT breast.

RECOMMENDATION:
RIGHT breast ultrasound at the time of BILATERAL diagnostic
mammography in 1 year (in order to confirm 2 years of stability of
the likely benign RIGHT breast masses).

I have discussed the findings and recommendations with the patient.
If applicable, a reminder letter will be sent to the patient
regarding the next appointment.

BI-RADS CATEGORY  3: Probably benign.

## 2021-04-09 IMAGING — US US BREAST*R* LIMITED INC AXILLA
1 series · 11 of 11 positions shown · non-contrast
Comparison: Previous exam(s).

CLINICAL DATA: One year interval follow-up of likely benign RIGHT
breast masses, likely fibroadenomas, identified on baseline
screening mammography. Annual evaluation, LEFT breast.

EXAM:
DIGITAL DIAGNOSTIC BILATERAL MAMMOGRAM WITH IMPLANTS, CAD AND
TOMOSYNTHESIS; ULTRASOUND RIGHT BREAST LIMITED
TECHNIQUE: Bilateral digital diagnostic mammography and breast tomosynthesis
was performed. The images were evaluated with computer-aided
detection. Standard and/or implant displaced views were performed.;
Targeted ultrasound examination of the right breast was performed

[Series 1: us breast*right* limited inc axilla · 0.06mm/px · 11 of 11 slices shown]
[im 1/11]
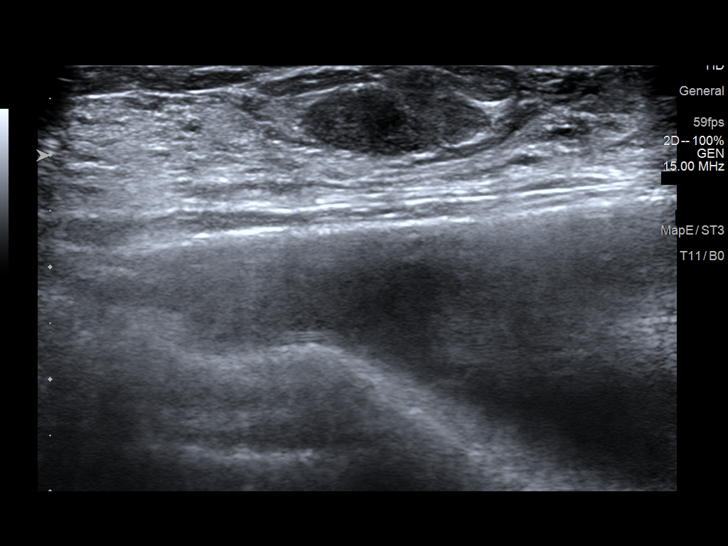
[im 2/11]
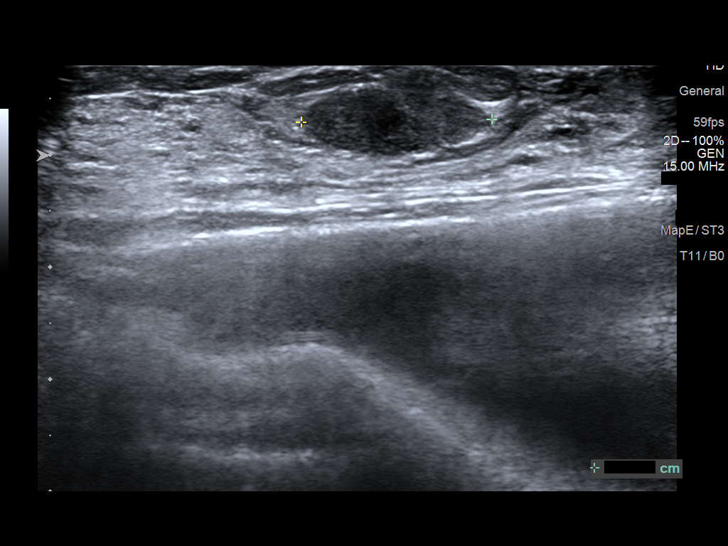
[im 3/11]
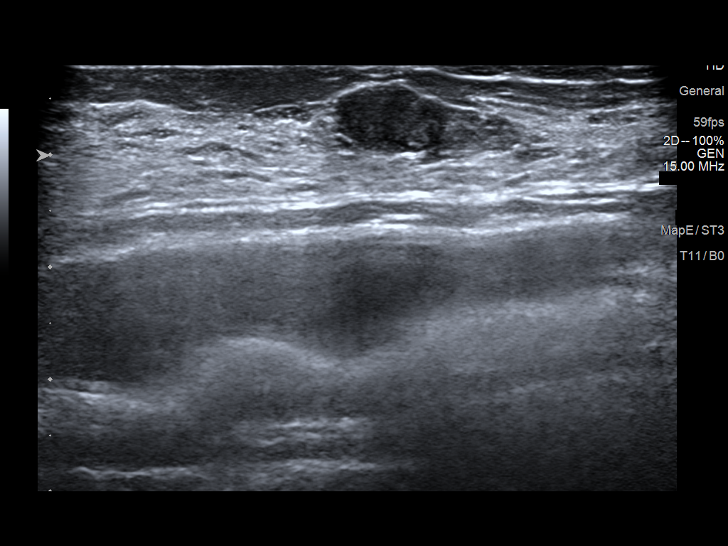
[im 4/11]
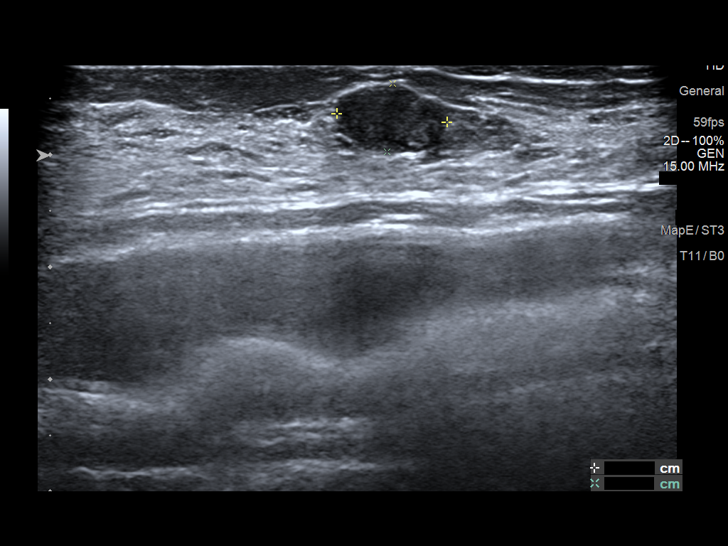
[im 5/11]
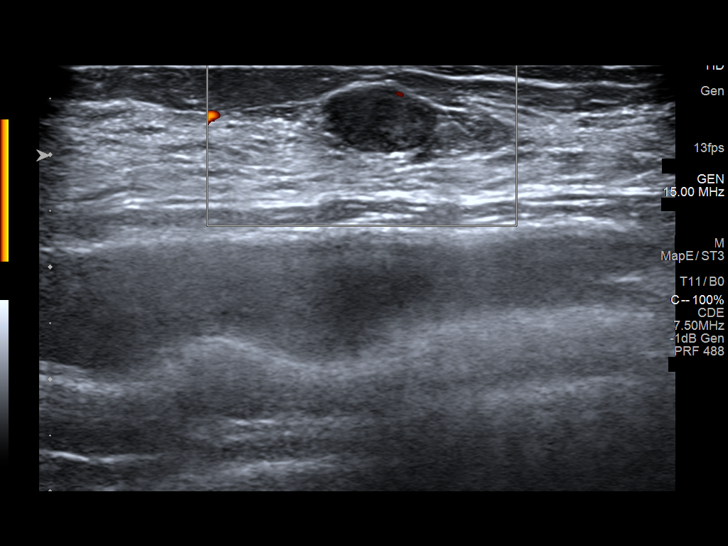
[im 6/11]
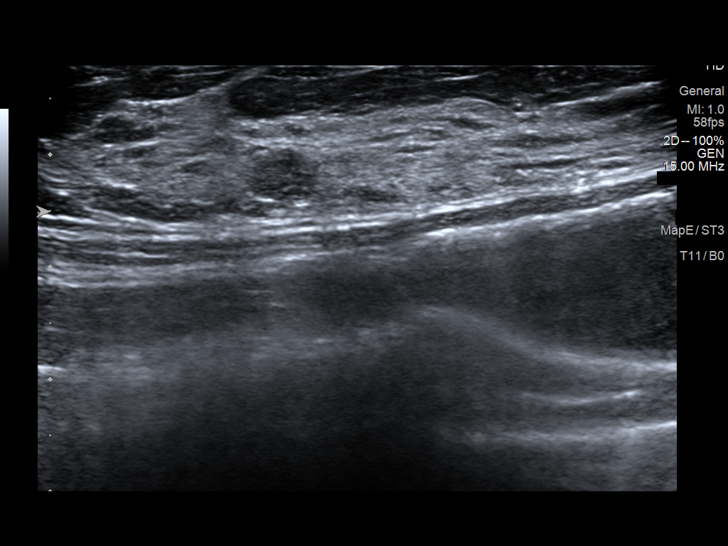
[im 7/11]
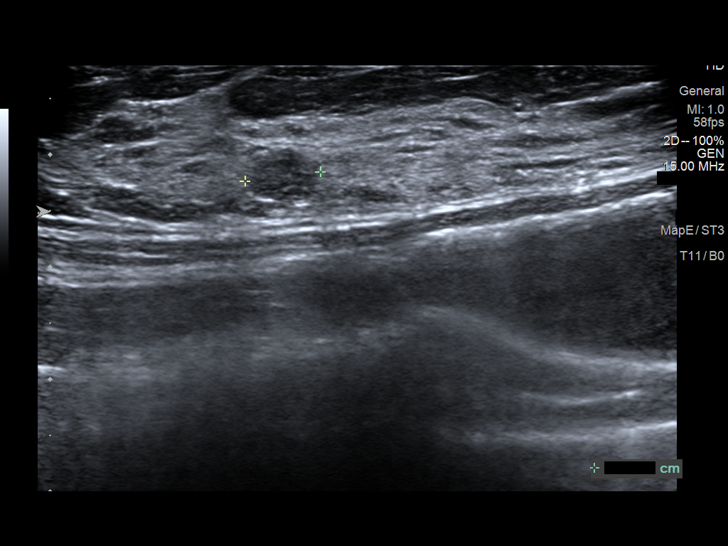
[im 8/11]
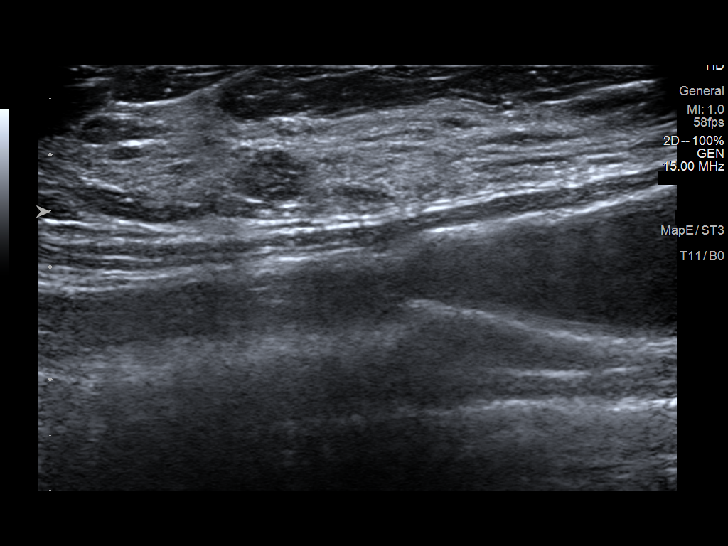
[im 9/11]
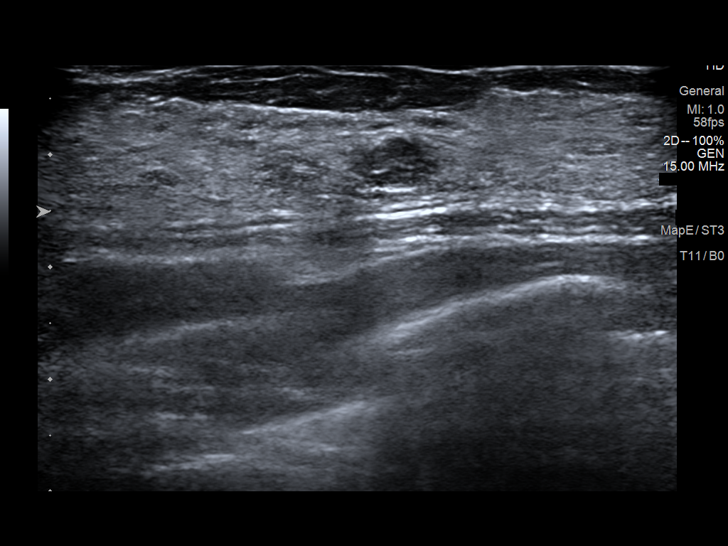
[im 10/11]
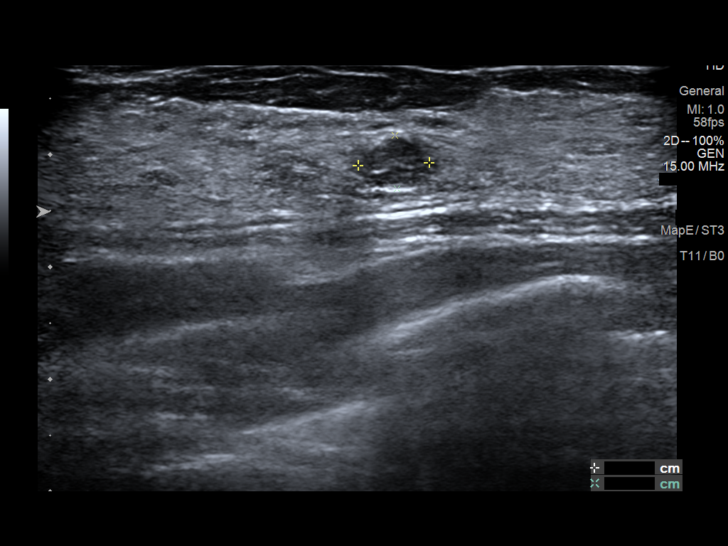
[im 11/11]
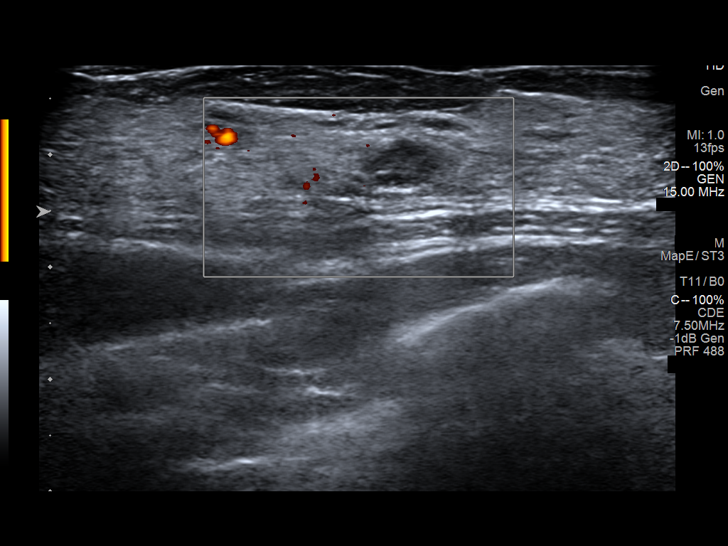

[11 of 11 positions shown; findings below may reference images not displayed]

ACR Breast Density Category c: The breast tissue is heterogeneously
dense, which may obscure small masses.
FINDINGS: Field CC and MLO views and implant displaced CC and MLO views with
tomosynthesis were obtained. The patient has retropectoral implants.

RIGHT: The circumscribed isodense mass in the upper inner subareolar
location is unchanged, measuring approximately 1.7 cm. There is no
associated architectural distortion or suspicious calcifications.

The partially obscured isodense mass in upper breast is also
unchanged, measuring less than 1 cm, most conspicuous on the MLO
view. There is no associated architectural distortion or suspicious
calcifications.

No new or suspicious findings elsewhere.

Targeted ultrasound is performed, demonstrating the previously
identified circumscribed oval parallel hypoechoic mass at the 1
o'clock position 1 cm from nipple at anterior depth, measuring
approximately 1.7 x 0.6 x 1.0 cm (previously 1.7 x 0.6 x 1.0 cm on
the [DATE] ultrasound and 1.7 x 0.5 x 1.0 cm on the [DATE]
ultrasound), demonstrating no posterior characteristics and
demonstrating peripheral power Doppler flow.

At the 11:30 o'clock position approximately 2 cm from the nipple at
middle to posterior depth is the previously identified circumscribed
oval parallel hypoechoic mass measuring approximately 0.7 x 0.5 x
0.6 cm (previously 0.7 x 0.6 x 0.7 cm on the [DATE] ultrasound
and 0.7 x 0.5 x 0.6 cm on the [DATE] ultrasound), demonstrating
posterior acoustic enhancement and no internal power Doppler flow.

LEFT: No findings suspicious for malignancy.
IMPRESSION: 1. Stable likely benign masses in the RIGHT breast, likely
fibroadenomas.
2. No mammographic evidence of malignancy involving the LEFT breast.

RECOMMENDATION:
RIGHT breast ultrasound at the time of BILATERAL diagnostic
mammography in 1 year (in order to confirm 2 years of stability of
the likely benign RIGHT breast masses).

I have discussed the findings and recommendations with the patient.
If applicable, a reminder letter will be sent to the patient
regarding the next appointment.

BI-RADS CATEGORY  3: Probably benign.

## 2021-05-10 ENCOUNTER — Other Ambulatory Visit: Payer: Self-pay

## 2021-05-10 ENCOUNTER — Ambulatory Visit (INDEPENDENT_AMBULATORY_CARE_PROVIDER_SITE_OTHER): Payer: 59 | Admitting: Obstetrics & Gynecology

## 2021-05-10 ENCOUNTER — Encounter (HOSPITAL_BASED_OUTPATIENT_CLINIC_OR_DEPARTMENT_OTHER): Payer: Self-pay | Admitting: Obstetrics & Gynecology

## 2021-05-10 VITALS — BP 123/78 | HR 64 | Ht 68.0 in | Wt 173.6 lb

## 2021-05-10 DIAGNOSIS — Z975 Presence of (intrauterine) contraceptive device: Secondary | ICD-10-CM | POA: Insufficient documentation

## 2021-05-10 DIAGNOSIS — Z30431 Encounter for routine checking of intrauterine contraceptive device: Secondary | ICD-10-CM
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
## 2021-05-31 ENCOUNTER — Ambulatory Visit: Payer: 59

## 2022-01-13 ENCOUNTER — Ambulatory Visit (INDEPENDENT_AMBULATORY_CARE_PROVIDER_SITE_OTHER): Payer: 59 | Admitting: Obstetrics & Gynecology

## 2022-01-13 ENCOUNTER — Encounter (HOSPITAL_BASED_OUTPATIENT_CLINIC_OR_DEPARTMENT_OTHER): Payer: Self-pay | Admitting: Obstetrics & Gynecology

## 2022-01-13 ENCOUNTER — Telehealth (HOSPITAL_BASED_OUTPATIENT_CLINIC_OR_DEPARTMENT_OTHER): Payer: Self-pay | Admitting: *Deleted

## 2022-01-13 ENCOUNTER — Other Ambulatory Visit: Payer: Self-pay

## 2022-01-13 VITALS — BP 135/92 | HR 56 | Ht 68.0 in | Wt 170.2 lb

## 2022-01-13 DIAGNOSIS — N6002 Solitary cyst of left breast: Secondary | ICD-10-CM

## 2022-01-13 NOTE — Telephone Encounter (Signed)
Pt c/o feeling a hard spot on her left breast. She describes it as feeling like a clump of frozen peas stuck together. Pt provided with appt today for evaluation.  ?
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
## 2022-02-12 ENCOUNTER — Other Ambulatory Visit (HOSPITAL_BASED_OUTPATIENT_CLINIC_OR_DEPARTMENT_OTHER): Payer: Self-pay | Admitting: Obstetrics & Gynecology

## 2022-02-12 ENCOUNTER — Telehealth (HOSPITAL_BASED_OUTPATIENT_CLINIC_OR_DEPARTMENT_OTHER): Payer: Self-pay

## 2022-02-12 DIAGNOSIS — N6322 Unspecified lump in the left breast, upper inner quadrant: Secondary | ICD-10-CM

## 2022-02-12 NOTE — Telephone Encounter (Signed)
Patient called 02/11/22 because she was told to let us know what her left breast feels like. She states that her left breast still feels the same. She wants to know what the next step needs to be. Should she have a mammogram? She is ok with whatever decision Dr. Sabra Heck makes. Please advise. tbw ?

## 2022-02-13 NOTE — Telephone Encounter (Signed)
Informed pt that order for diagnostic mammogram had been placed with the breast center. Advised she could call them to make an appt or they will reach out to her.  ? ?

## 2022-02-25 ENCOUNTER — Ambulatory Visit
Admission: RE | Admit: 2022-02-25 | Discharge: 2022-02-25 | Disposition: A | Discharge status: Home / Self Care | Payer: 59 | Source: Ambulatory Visit | Attending: Obstetrics & Gynecology | Admitting: Obstetrics & Gynecology

## 2022-02-25 ENCOUNTER — Other Ambulatory Visit (HOSPITAL_BASED_OUTPATIENT_CLINIC_OR_DEPARTMENT_OTHER): Payer: Self-pay | Admitting: Obstetrics & Gynecology

## 2022-02-25 DIAGNOSIS — N6322 Unspecified lump in the left breast, upper inner quadrant: Secondary | ICD-10-CM

## 2022-02-25 IMAGING — US US BREAST*L* LIMITED INC AXILLA
1 series · 13 of 25 positions shown · non-contrast
Comparison: Previous exam(s).

CLINICAL DATA: 41-year-old female presenting for evaluation of a
new mass in the left breast. Patient states she first noted the mass
in the left breast several months ago and it appears to be
enlarging.

EXAM:
DIGITAL DIAGNOSTIC UNILATERAL LEFT MAMMOGRAM WITH IMPLANTS, CAD AND
TOMOSYNTHESIS; ULTRASOUND LEFT BREAST LIMITED
TECHNIQUE: Left digital diagnostic mammography and breast tomosynthesis was
performed. The images were evaluated with computer-aided detection.
Standard and/or implant displaced views were performed.; Targeted
ultrasound examination of the left breast was performed.

[Series 1: us breast*left* limited inc axilla · 0.06mm/px · 45 acquisitions, 13 frames shown]
[im 1/45]
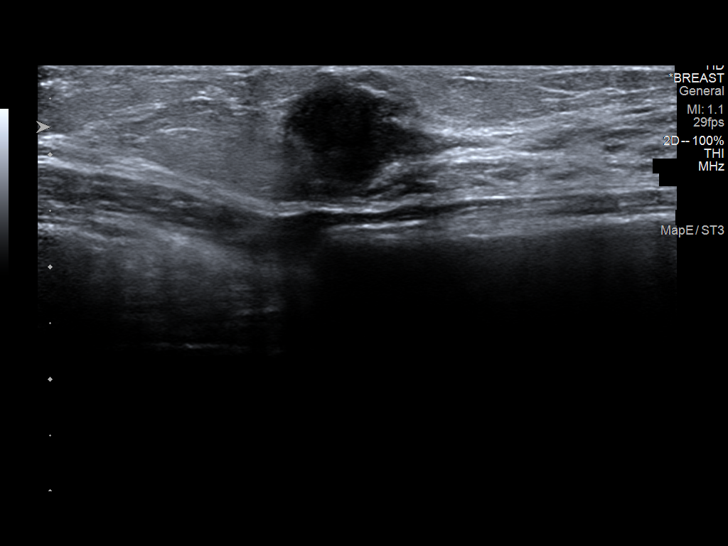
[im 4/45]
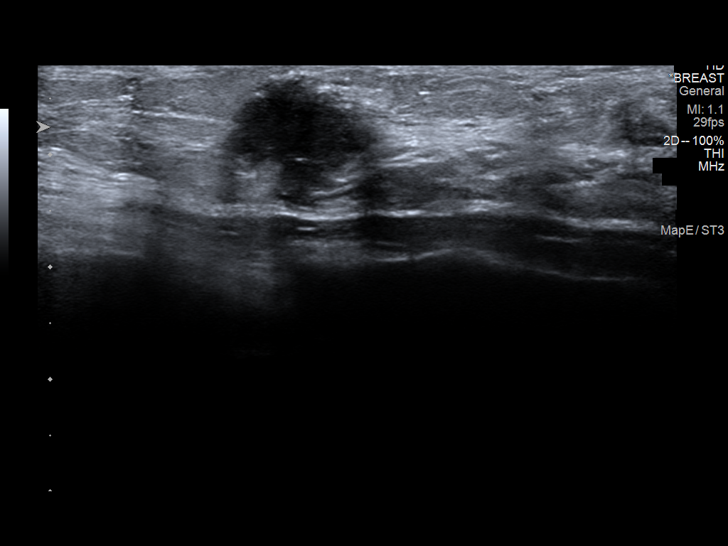
[im 8/45]
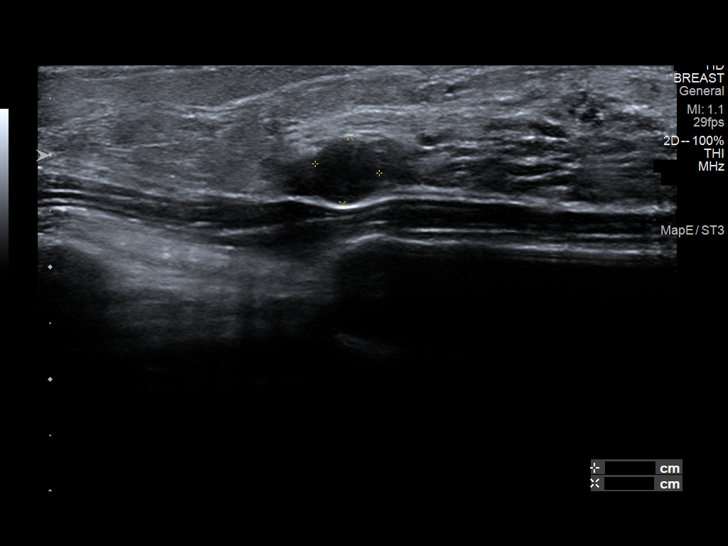
[im 12/45]
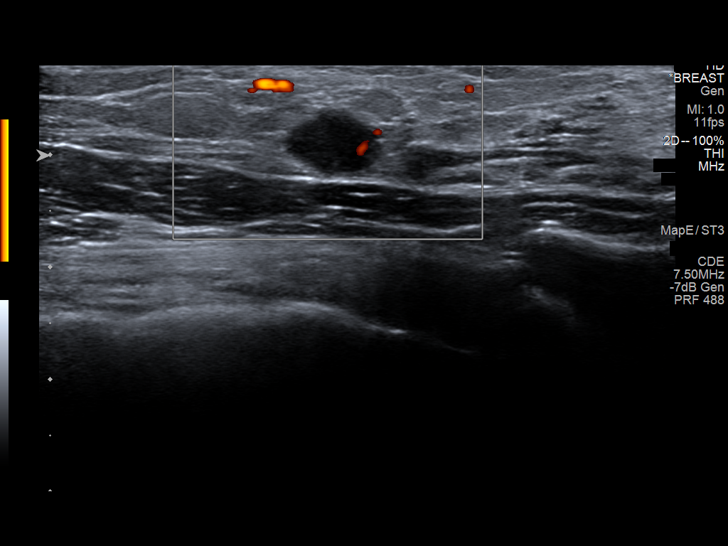
[im 15/45]
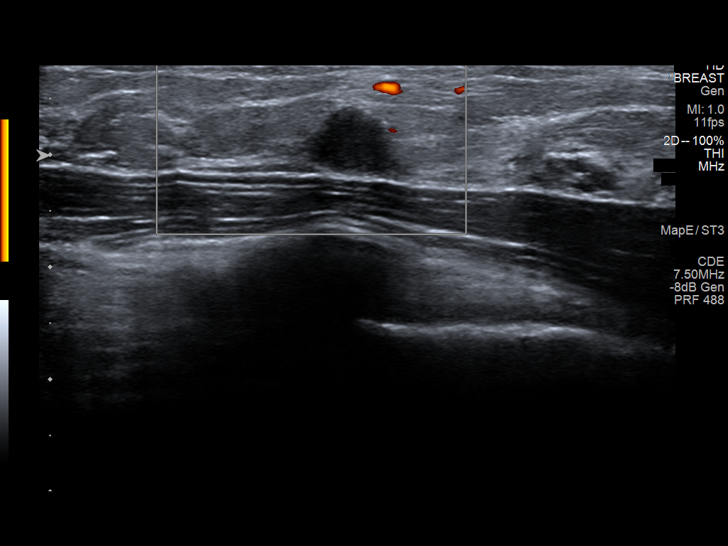
[im 19/45]
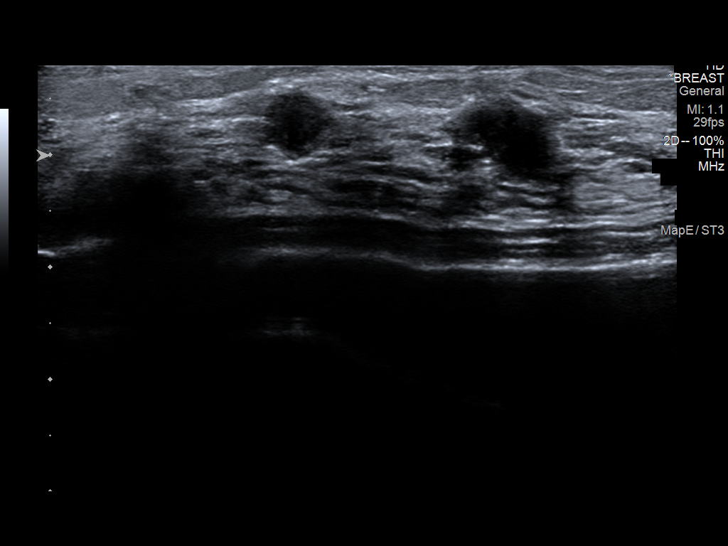
[im 23/45]
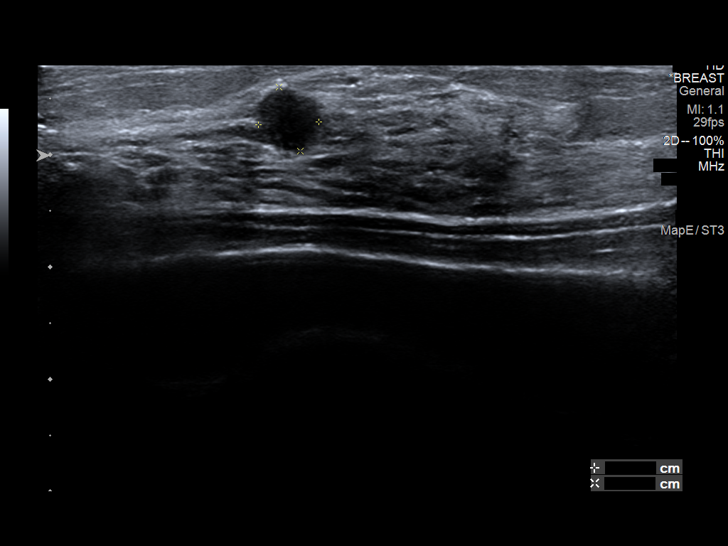
[im 26/45]
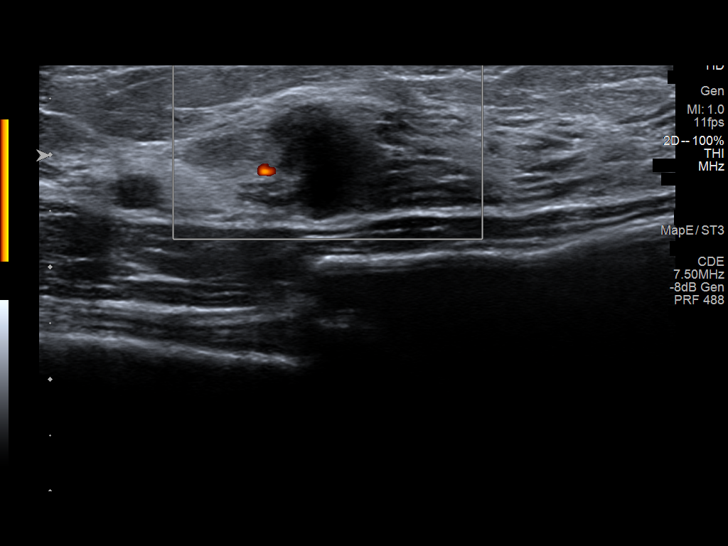
[im 30/45]
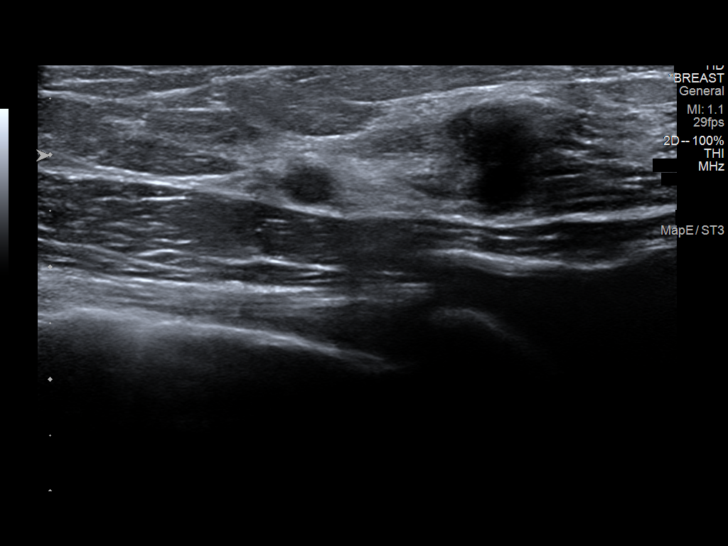
[im 34/45]
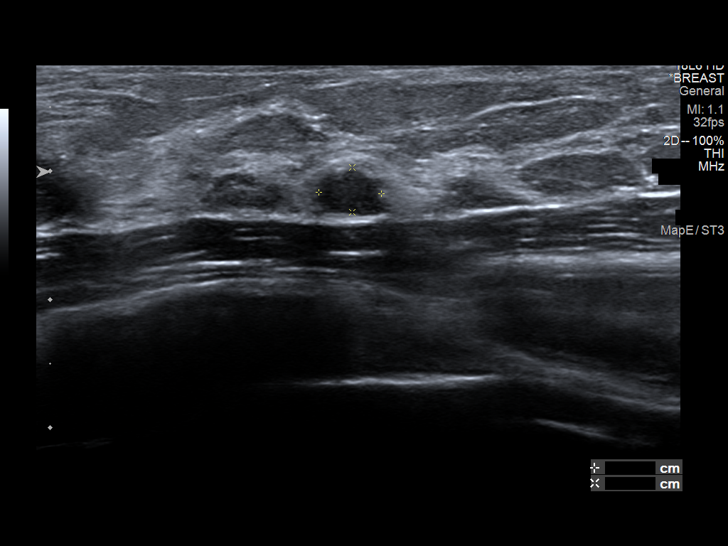
[im 37/45]
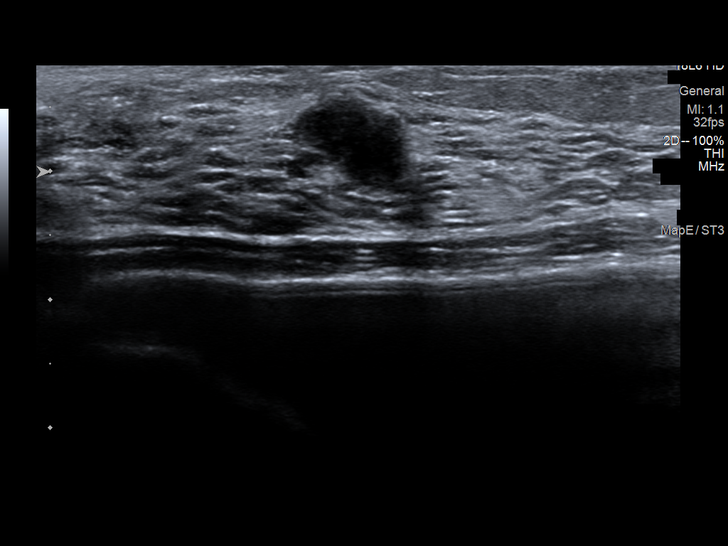
[im 41/45]
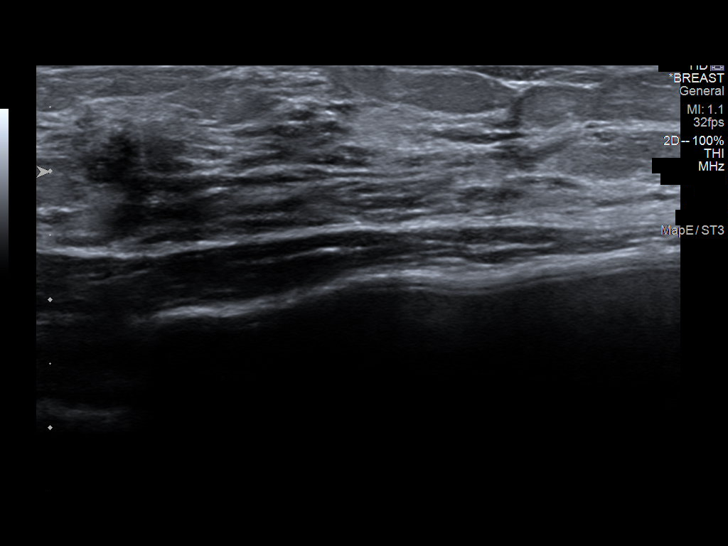
[im 45/45]
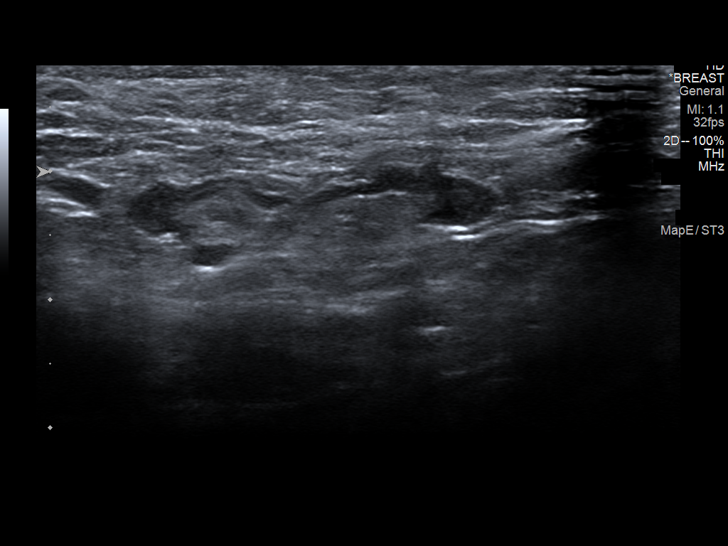

[13 of 25 positions shown; findings below may reference images not displayed]

ACR Breast Density Category c: The breast tissue is heterogeneously
dense, which may obscure small masses.
FINDINGS: Mammogram:

The patient has a retropectoral implant. A skin BB marks the
palpable site of concern reported by the patient in the medial left
breast. A spot tangential view of this area is performed in addition
to standard views demonstrating an irregular mass at the palpable
site. There is an additional irregular mass with distortion, also
seen on the spot tangential view, in the upper inner left breast.

On physical exam at the site of concern reported by the patient in
the medial left breast I feel a discrete mass. I feel an additional
discrete mass in the upper inner left breast.

Ultrasound:

Targeted ultrasound is performed in the left breast at palpable site
at 9:30 o'clock 5 cm from the nipple demonstrating an irregular
hypoechoic mass measuring 1.4 x 1.2 x 1.3 cm.

There are additional smaller similar appearing subcentimeter masses
at 10 o'clock 5 cm from nipple, at 10 o'clock 6 cm from the nipple,
at 11 o'clock 3 cm from nipple and at 11 o'clock 5 cm from the
nipple.

At 11 o'clock 4 cm from the nipple there is an irregular hypoechoic
mass measuring 1.1 x 1.1 x 0.7 cm.

At 11:30 o'clock 3 cm from the nipple there is an irregular
hypoechoic mass measuring 0.9 x 0.5 x 0.8 cm.

Targeted ultrasound of the left axilla demonstrates normal lymph
nodes.
IMPRESSION: Multiple suspicious masses throughout the upper inner left breast,
the largest at 9:30 o'clock which corresponds to the palpable site.
The second largest also palpable is at 11 o'clock 4 cm from the
nipple.

RECOMMENDATION:
Ultrasound-guided core needle biopsy x2 of the left breast for the
9:30 o'clock mass and the 11 o'clock mass. Additional biopsies may
be performed depending on pathology results. If the biopsies return
as malignancy recommend bilateral breast MRI for extent of disease.

I have discussed the findings and recommendations with the patient
who agrees to proceed with biopsy. The patient will be scheduled for
the biopsy appointment prior to leaving the office today.

BI-RADS CATEGORY  4: Suspicious.

## 2022-02-25 IMAGING — MG MM DIGITAL DIAGNOSTIC UNILAT*L* IMPLANT W/ TOMO W/ CAD
7 of 10 series · 8 of 26 positions shown · non-contrast
Comparison: Previous exam(s).

CLINICAL DATA: 41-year-old female presenting for evaluation of a
new mass in the left breast. Patient states she first noted the mass
in the left breast several months ago and it appears to be
enlarging.

EXAM:
DIGITAL DIAGNOSTIC UNILATERAL LEFT MAMMOGRAM WITH IMPLANTS, CAD AND
TOMOSYNTHESIS; ULTRASOUND LEFT BREAST LIMITED
TECHNIQUE: Left digital diagnostic mammography and breast tomosynthesis was
performed. The images were evaluated with computer-aided detection.
Standard and/or implant displaced views were performed.; Targeted
ultrasound examination of the left breast was performed.

[L MLO]
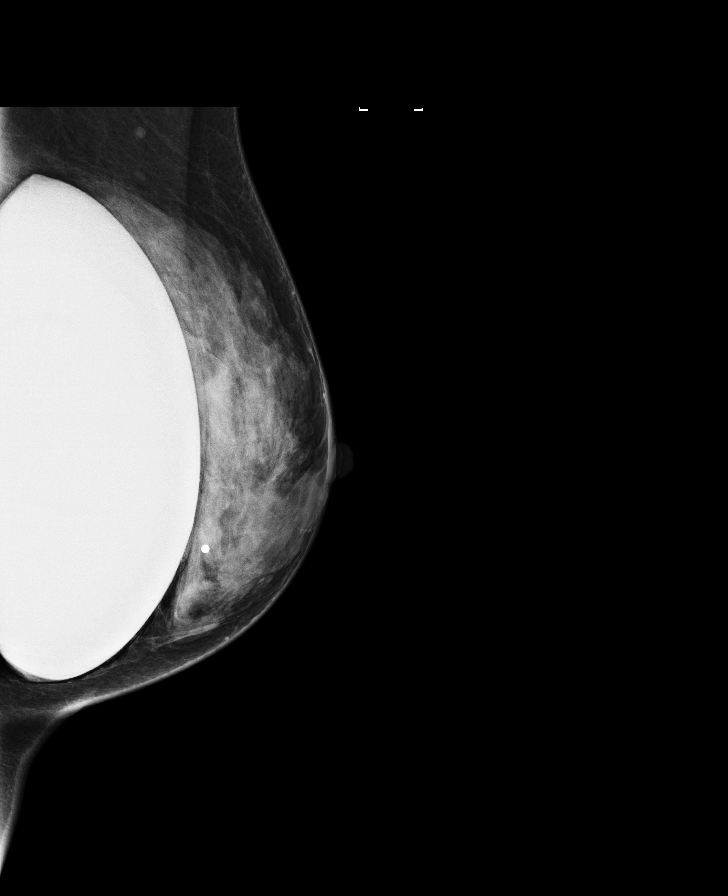

[L CC]
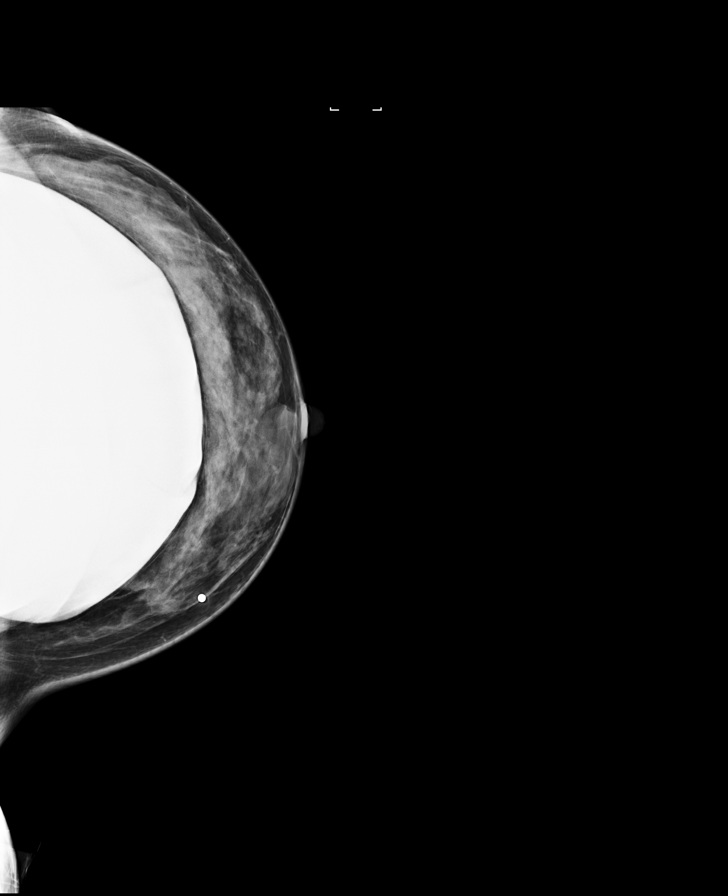

[L MLO synth-2D]
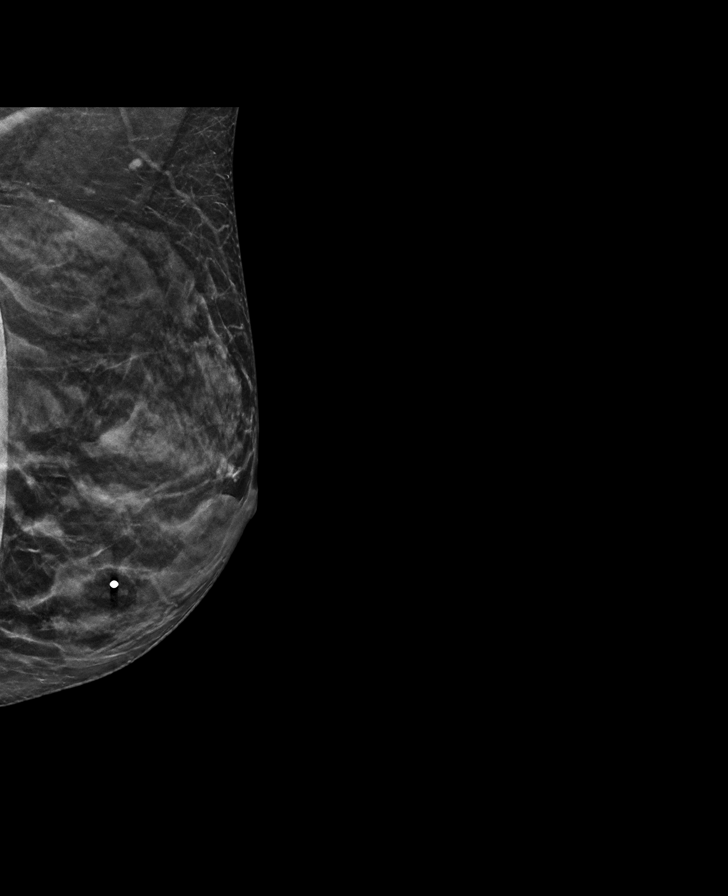

[L ML synth-2D]
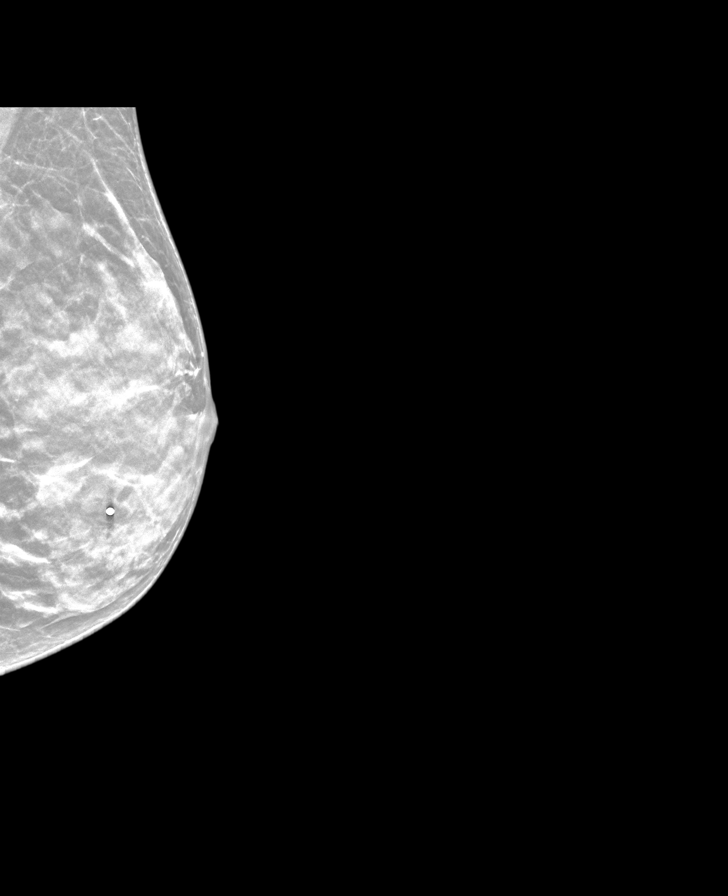

[L CC synth-2D]
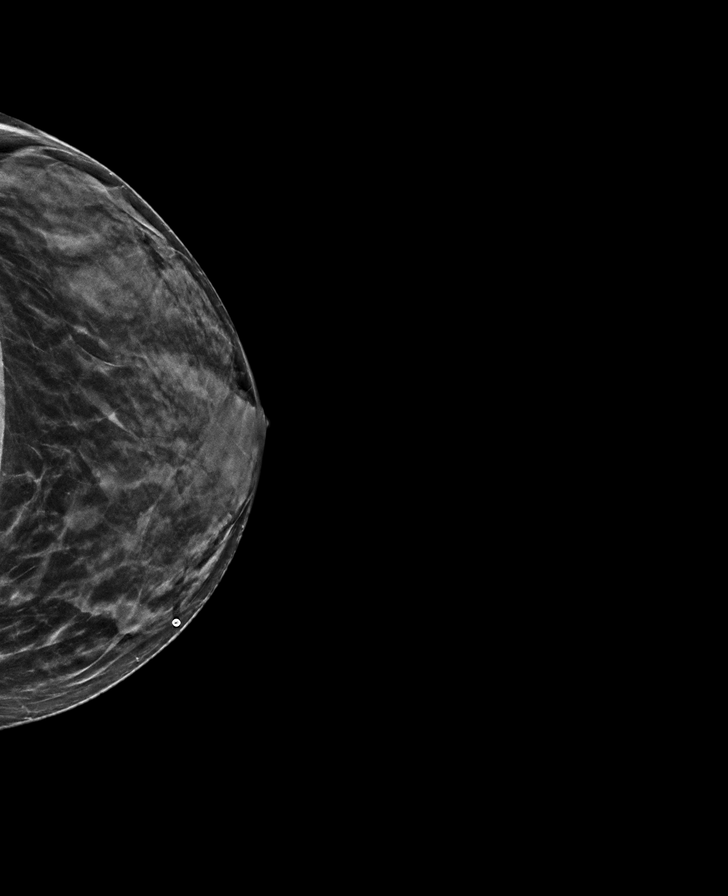

[L TAN synth-2D]
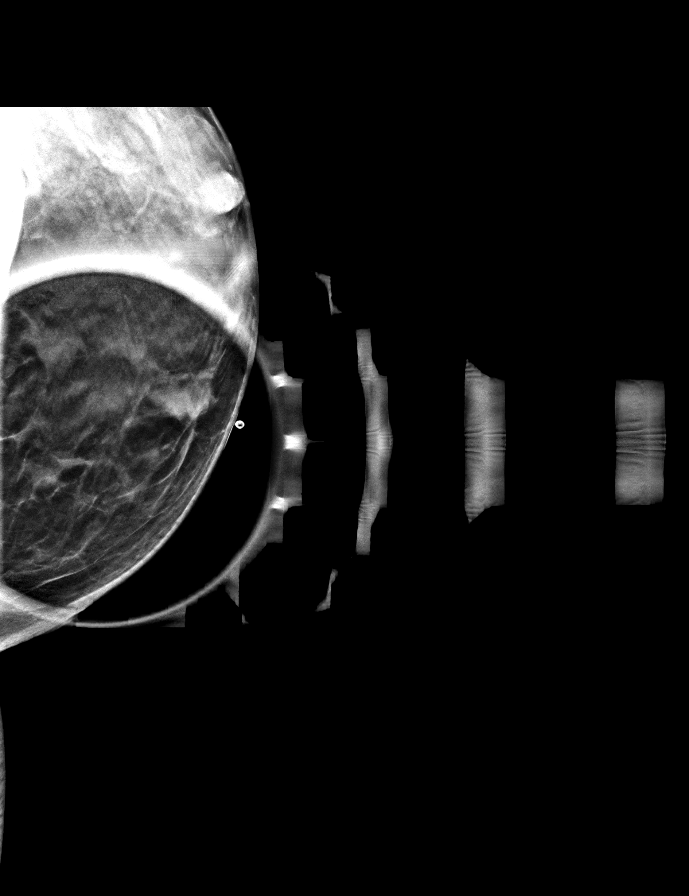

[L CCID BREAST TOMOSYNTHESIS IMAGE tomo · 2 of 47 frames shown]
[frame 16/47]
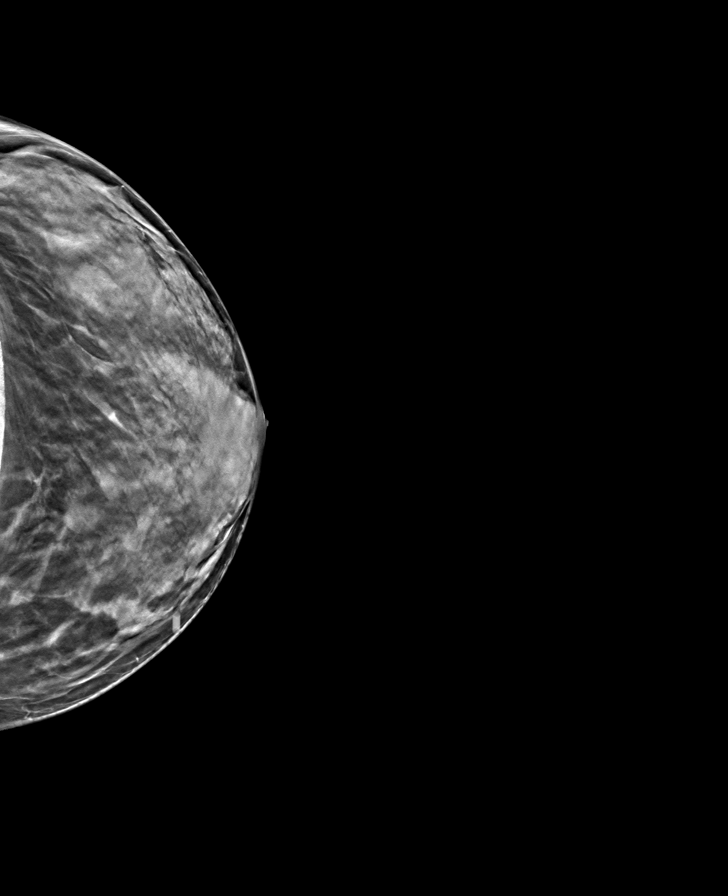
[frame 24/47]
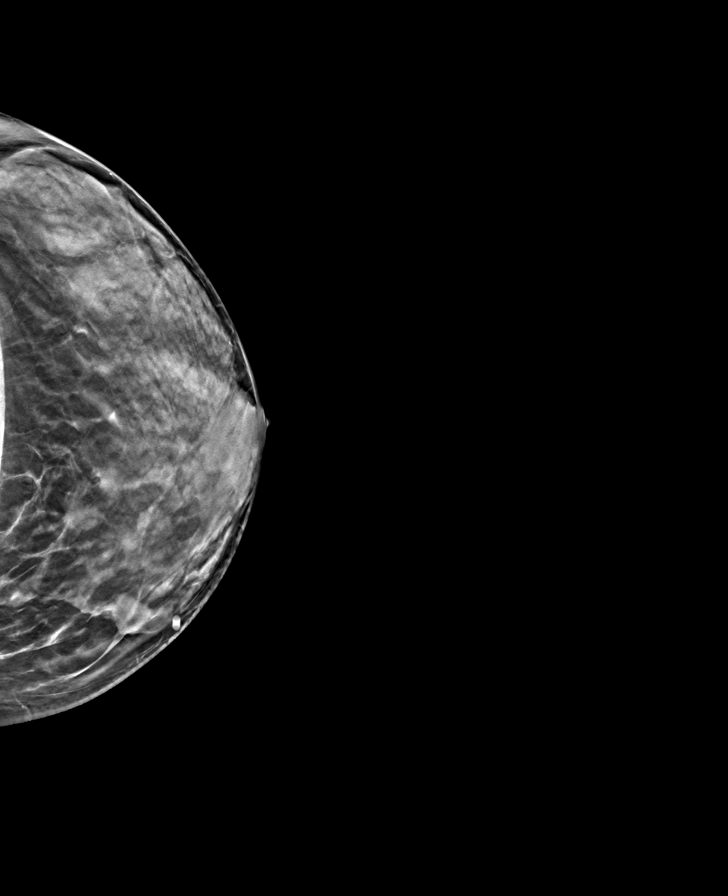

[8 of 26 positions shown; findings below may reference images not displayed]

ACR Breast Density Category c: The breast tissue is heterogeneously
dense, which may obscure small masses.
FINDINGS: Mammogram:

The patient has a retropectoral implant. A skin BB marks the
palpable site of concern reported by the patient in the medial left
breast. A spot tangential view of this area is performed in addition
to standard views demonstrating an irregular mass at the palpable
site. There is an additional irregular mass with distortion, also
seen on the spot tangential view, in the upper inner left breast.

On physical exam at the site of concern reported by the patient in
the medial left breast I feel a discrete mass. I feel an additional
discrete mass in the upper inner left breast.

Ultrasound:

Targeted ultrasound is performed in the left breast at palpable site
at 9:30 o'clock 5 cm from the nipple demonstrating an irregular
hypoechoic mass measuring 1.4 x 1.2 x 1.3 cm.

There are additional smaller similar appearing subcentimeter masses
at 10 o'clock 5 cm from nipple, at 10 o'clock 6 cm from the nipple,
at 11 o'clock 3 cm from nipple and at 11 o'clock 5 cm from the
nipple.

At 11 o'clock 4 cm from the nipple there is an irregular hypoechoic
mass measuring 1.1 x 1.1 x 0.7 cm.

At 11:30 o'clock 3 cm from the nipple there is an irregular
hypoechoic mass measuring 0.9 x 0.5 x 0.8 cm.

Targeted ultrasound of the left axilla demonstrates normal lymph
nodes.
IMPRESSION: Multiple suspicious masses throughout the upper inner left breast,
the largest at 9:30 o'clock which corresponds to the palpable site.
The second largest also palpable is at 11 o'clock 4 cm from the
nipple.

RECOMMENDATION:
Ultrasound-guided core needle biopsy x2 of the left breast for the
9:30 o'clock mass and the 11 o'clock mass. Additional biopsies may
be performed depending on pathology results. If the biopsies return
as malignancy recommend bilateral breast MRI for extent of disease.

I have discussed the findings and recommendations with the patient
who agrees to proceed with biopsy. The patient will be scheduled for
the biopsy appointment prior to leaving the office today.

BI-RADS CATEGORY  4: Suspicious.

## 2022-03-04 ENCOUNTER — Ambulatory Visit
Admission: RE | Admit: 2022-03-04 | Discharge: 2022-03-04 | Disposition: A | Discharge status: Home / Self Care | Payer: 59 | Source: Ambulatory Visit | Attending: Obstetrics & Gynecology | Admitting: Obstetrics & Gynecology

## 2022-03-04 ENCOUNTER — Telehealth (HOSPITAL_BASED_OUTPATIENT_CLINIC_OR_DEPARTMENT_OTHER): Payer: Self-pay

## 2022-03-04 ENCOUNTER — Other Ambulatory Visit (HOSPITAL_BASED_OUTPATIENT_CLINIC_OR_DEPARTMENT_OTHER): Payer: Self-pay | Admitting: Obstetrics & Gynecology

## 2022-03-04 DIAGNOSIS — N6322 Unspecified lump in the left breast, upper inner quadrant: Secondary | ICD-10-CM

## 2022-03-04 DIAGNOSIS — N6310 Unspecified lump in the right breast, unspecified quadrant: Secondary | ICD-10-CM

## 2022-03-04 IMAGING — MG MM BREAST LOCALIZATION CLIP
4 series · 4 of 12 positions shown · non-contrast
Comparison: Previous exam(s).

CLINICAL DATA: Status post left breast ultrasound-guided biopsy.

EXAM:
3D DIAGNOSTIC LEFT MAMMOGRAM POST ULTRASOUND BIOPSY

[L ML synth-2D]
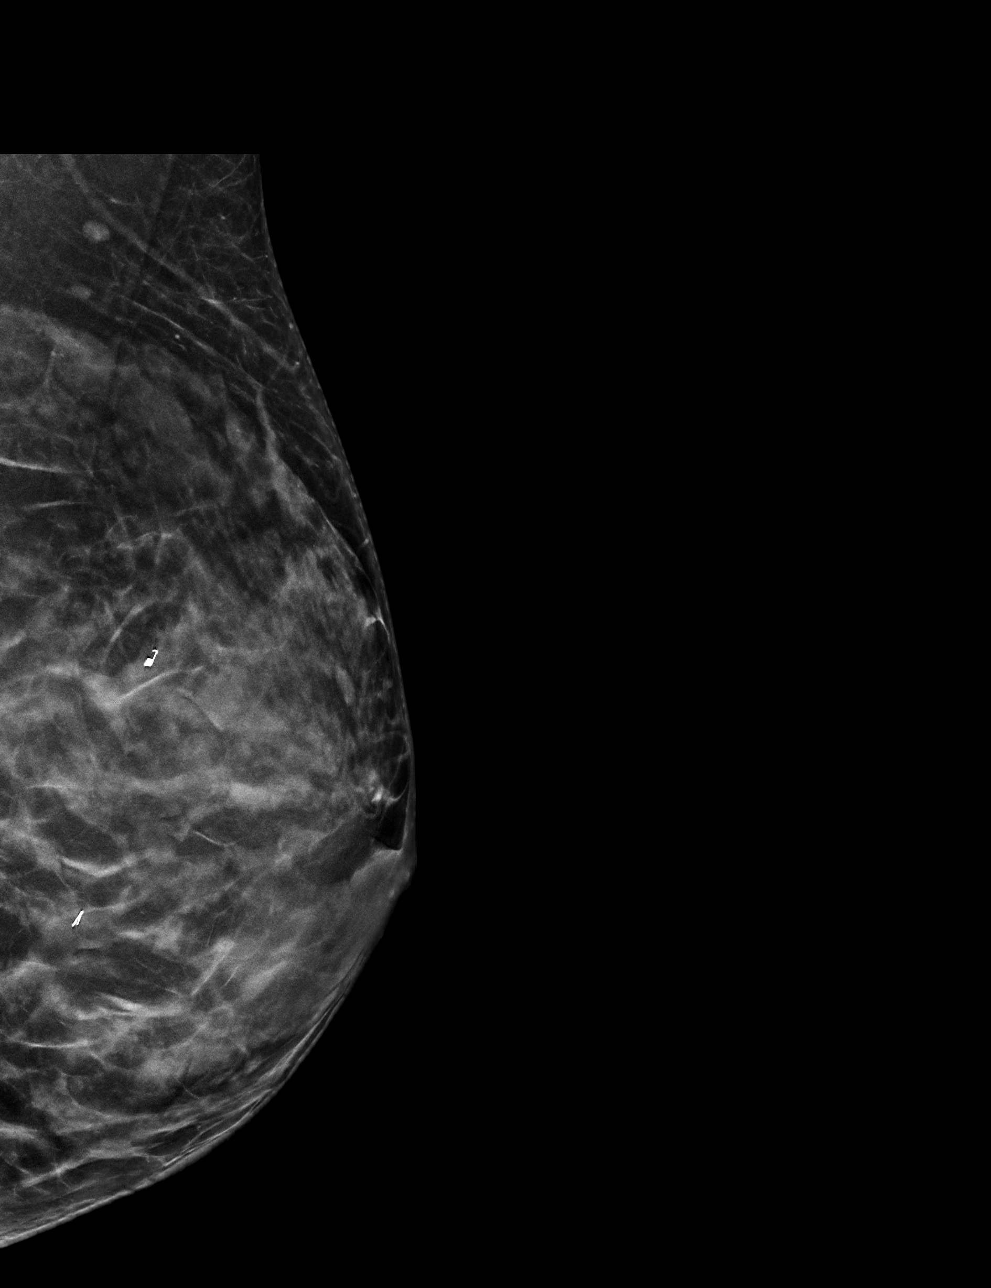

[L CC synth-2D]
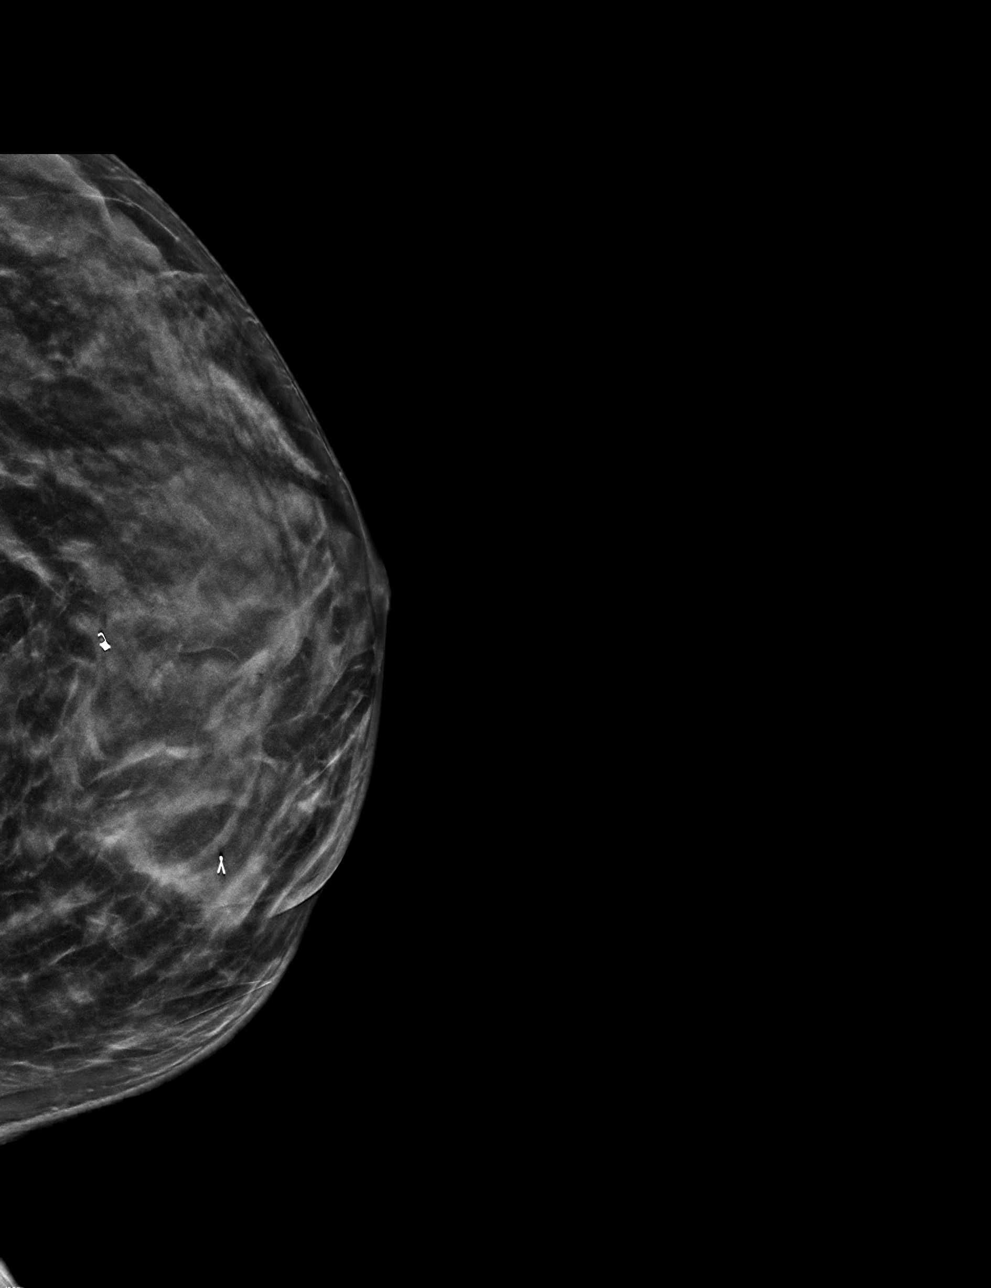

[L MLID BREAST TOMOSYNTHESIS IMAGE tomo · tomo slice 25/50.0]
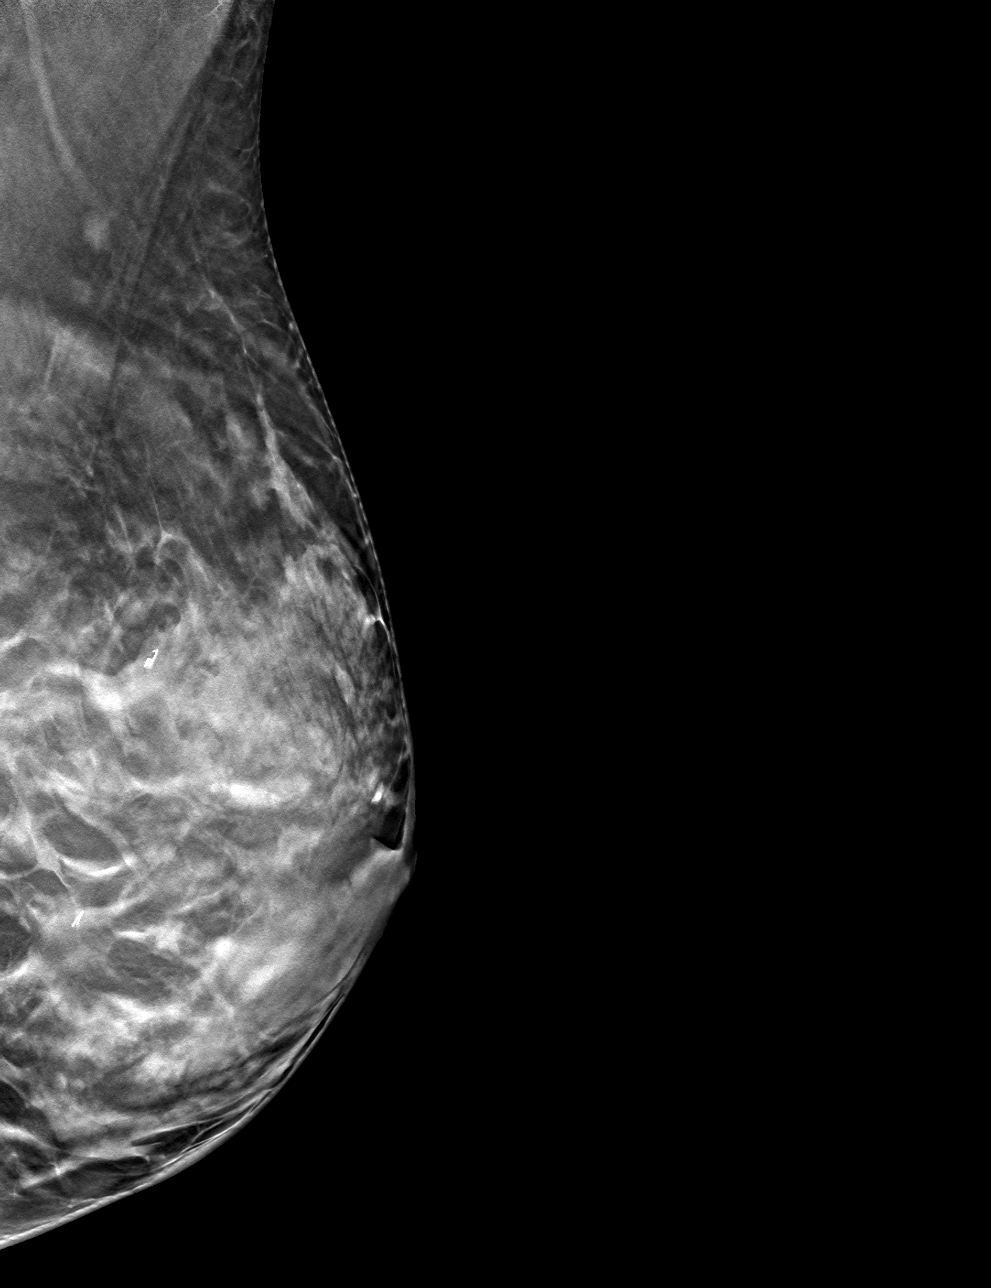

[L CCID BREAST TOMOSYNTHESIS IMAGE tomo · tomo slice 26/51.0]
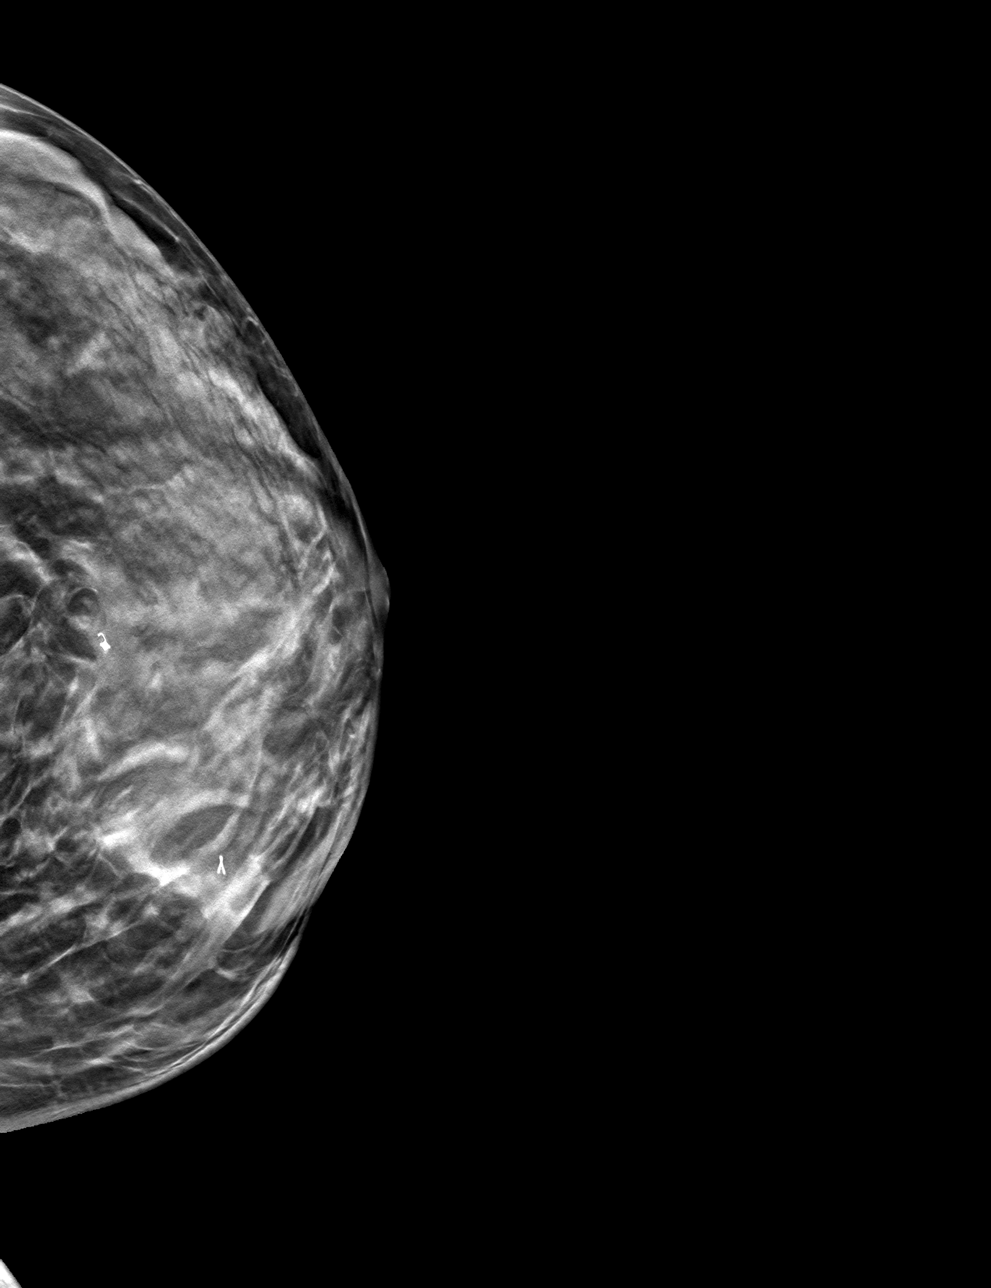

[4 of 12 positions shown; findings below may reference images not displayed]

FINDINGS: 3D Mammographic images were obtained following ultrasound guided
biopsy of the left breast. The biopsy marking clips are in the
expected locations.
IMPRESSION: Appropriate positioning of both post biopsy marking clips in the
medial left breast.

Final Assessment: Post Procedure Mammograms for Marker Placement

## 2022-03-04 IMAGING — MG MM DIGITAL DIAGNOSTIC UNILAT*R* IMPLANT W/ TOMO W/ CAD
6 series · 6 of 14 positions shown · non-contrast
Comparison: Previous exam(s).

CLINICAL DATA: 41-year-old female for follow-up of probably benign
right breast masses.

EXAM:
DIGITAL DIAGNOSTIC UNILATERAL RIGHT MAMMOGRAM WITH IMPLANTS, CAD AND
TOMOSYNTHESIS; ULTRASOUND RIGHT BREAST LIMITED
TECHNIQUE: Right digital diagnostic mammography and breast tomosynthesis was
performed. The images were evaluated with computer-aided detection.
Standard and/or implant displaced views were performed.; Targeted
ultrasound examination of the right breast was performed

[R MLO]
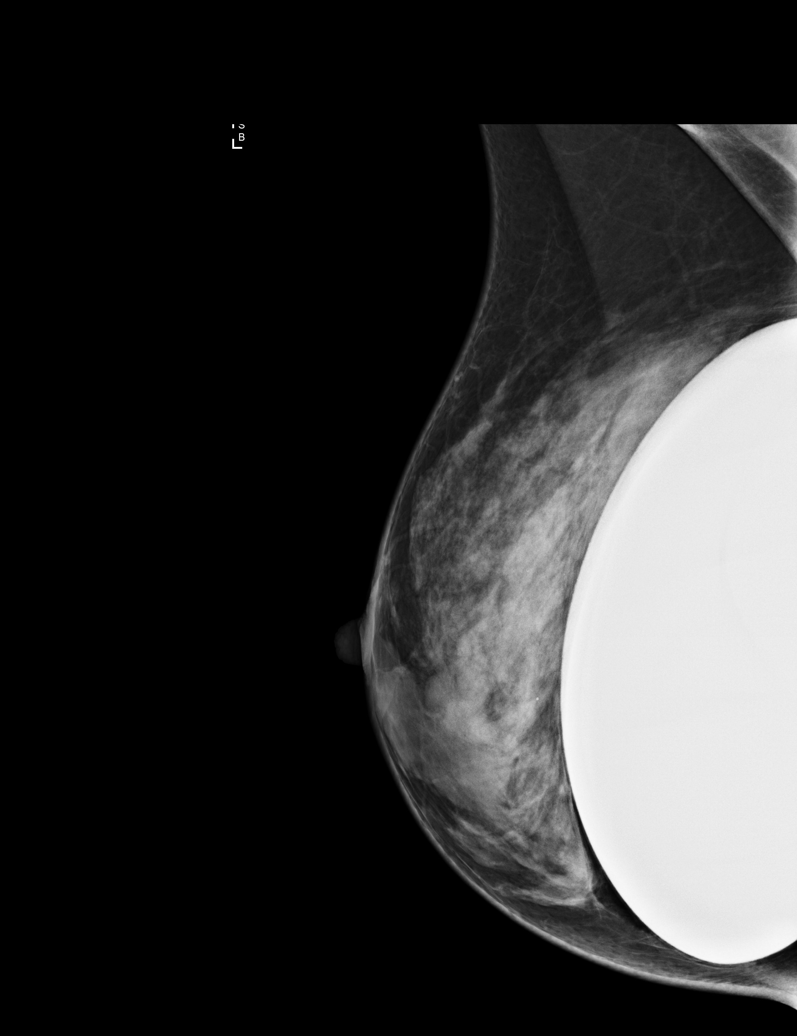

[R CC]
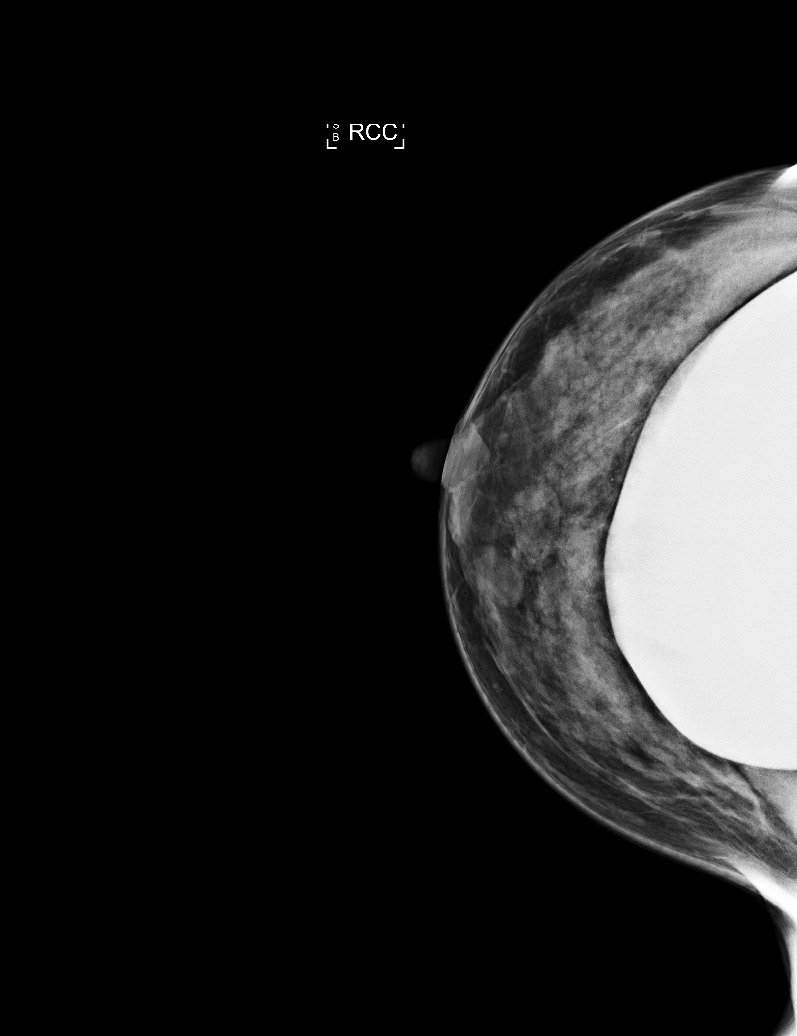

[R CC synth-2D]
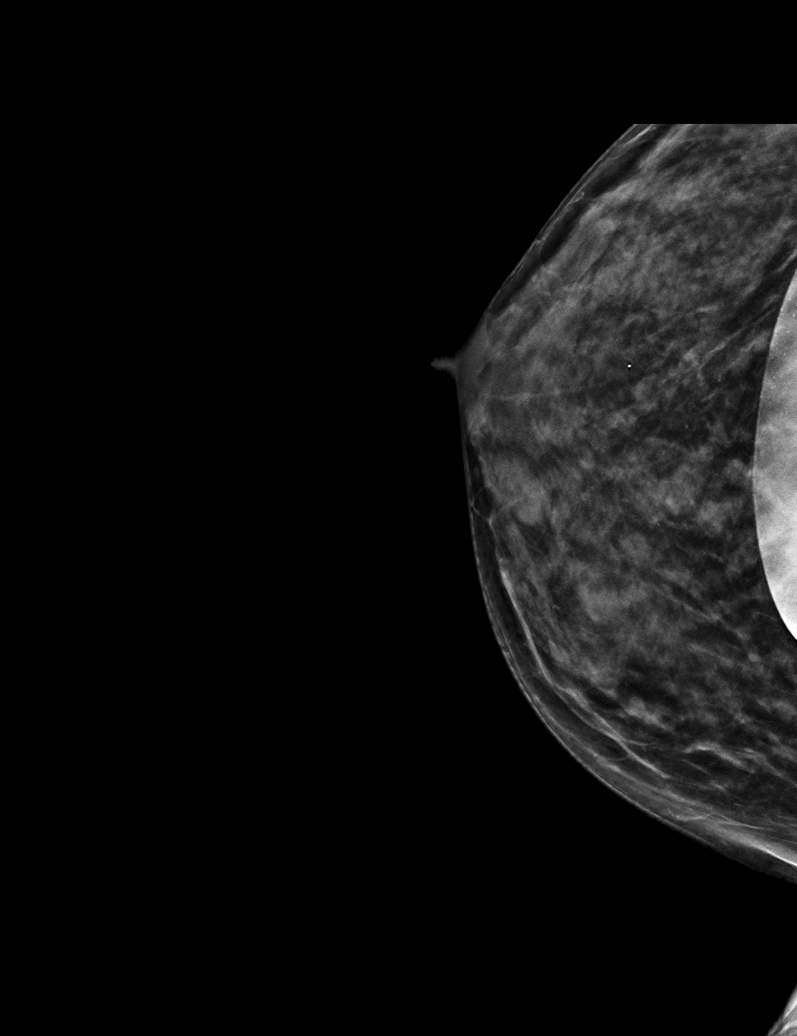

[R MLO synth-2D]
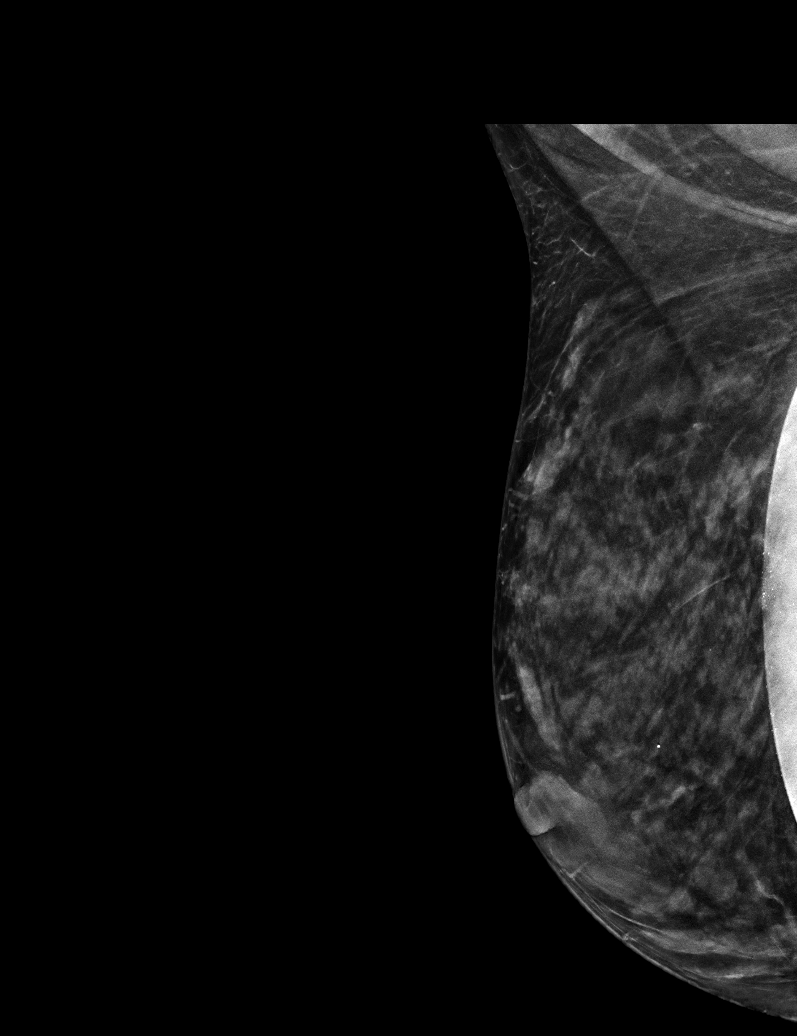

[R MLOID BREAST TOMOSYNTHESIS IMAGE tomo · tomo slice 25/49.0]
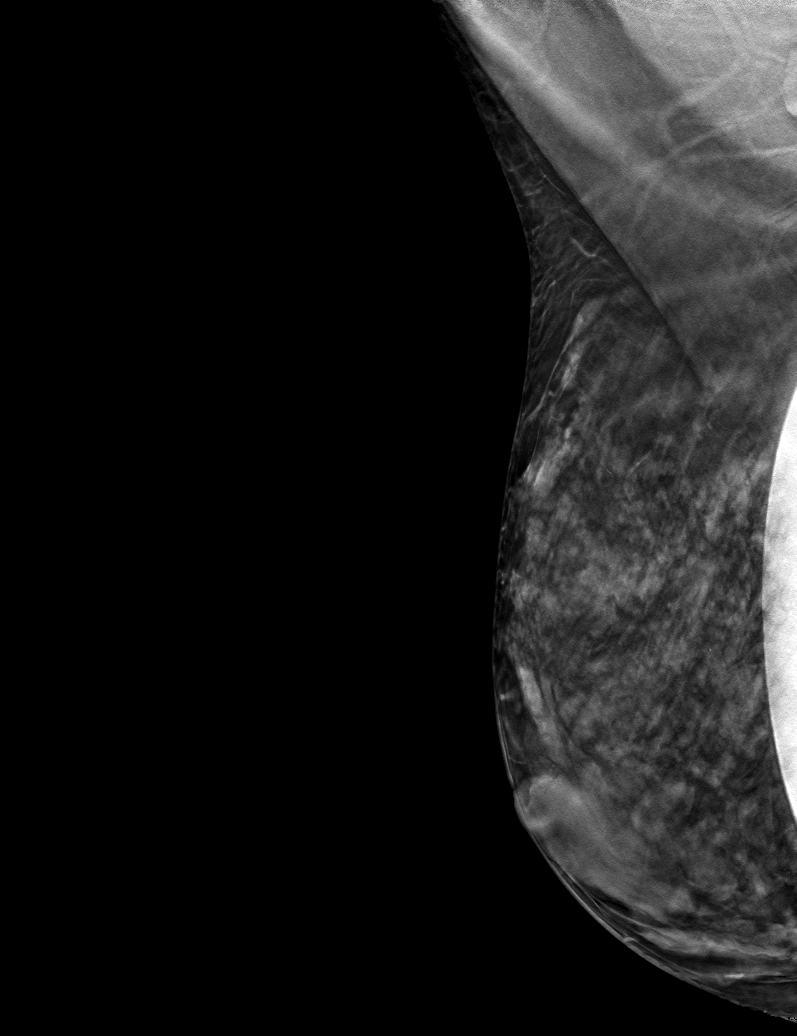

[R CCID BREAST TOMOSYNTHESIS IMAGE tomo · tomo slice 22/43.0]
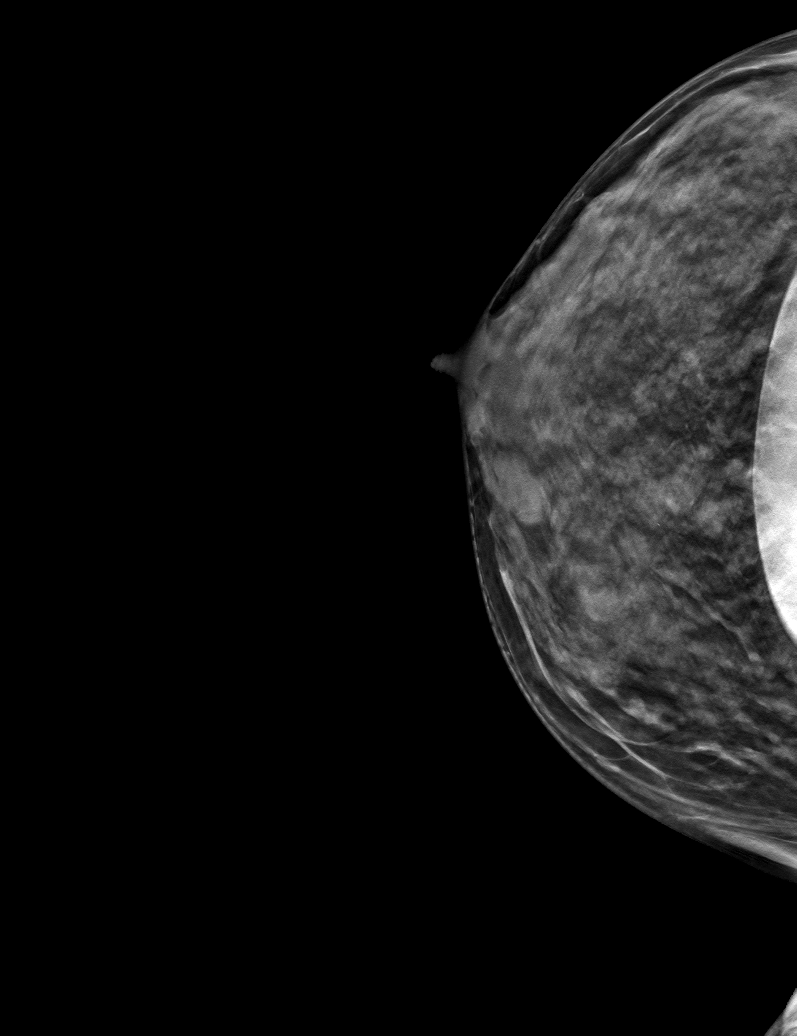

[6 of 14 positions shown; findings below may reference images not displayed]

ACR Breast Density Category d: The breast tissue is extremely dense,
which lowers the sensitivity of mammography.
FINDINGS: Circumscribed isodense mass in the subareolar right breast is
mammographically stable. Otherwise, no new or suspicious findings in
the remainder of the right breast. The patient has retropectoral
implants.

Targeted ultrasound is performed, showing stable appearance of an
oval, circumscribed hypoechoic mass at the 1 o'clock position 1 cm
from the nipple. It measures 1.6 x 1.0 x 0.6 cm (previously 1.7 x
1.0 x 0.6 cm). An additional mass at the [DATE] position 2 cm from
the nipple has decreased in size, now measuring 5 x 4 x 2 mm
(previously 7 x 5 x 6 mm).
IMPRESSION: 1. No mammographic evidence of malignancy on the right.
2. Benign right breast masses demonstrating 2 year
stability/interval decrease in size. No further follow-up required.

RECOMMENDATION:
Final recommendation will be based on the results of the patient's
recent left breast biopsies.

I have discussed the findings and recommendations with the patient.
If applicable, a reminder letter will be sent to the patient
regarding the next appointment.

BI-RADS CATEGORY  2: Benign.

## 2022-03-04 IMAGING — US US BREAST BX W LOC DEV 1ST LESION IMG BX SPEC US GUIDE*L*
1 series · 12 of 15 positions shown · non-contrast
Comparison: Previous exam(s).
COMPARISON: Previous exam(s).

Addendum:
CLINICAL DATA: 41-year-old female with suspicious left breast
masses.

EXAM:
ULTRASOUND GUIDED LEFT BREAST CORE NEEDLE BIOPSY x2

[Series 1: us breast bx w loc dev 1st lesion img bx spec us g · 0.06mm/px · 12 of 15 slices shown]
[im 1/15]
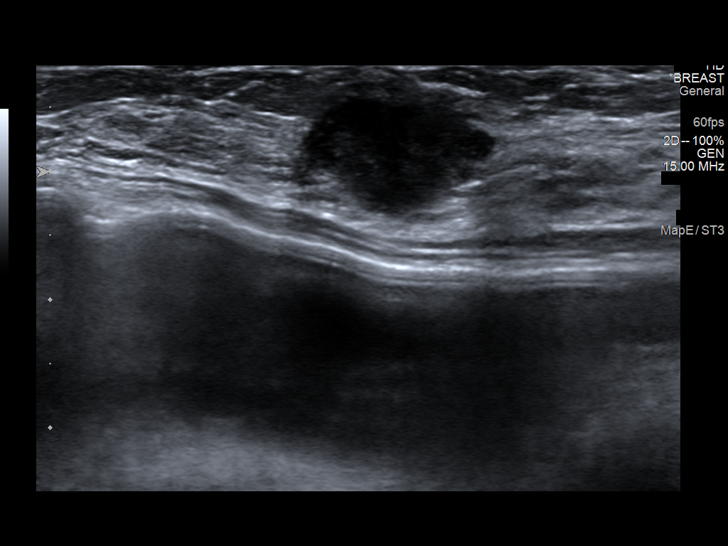
[im 2/15]
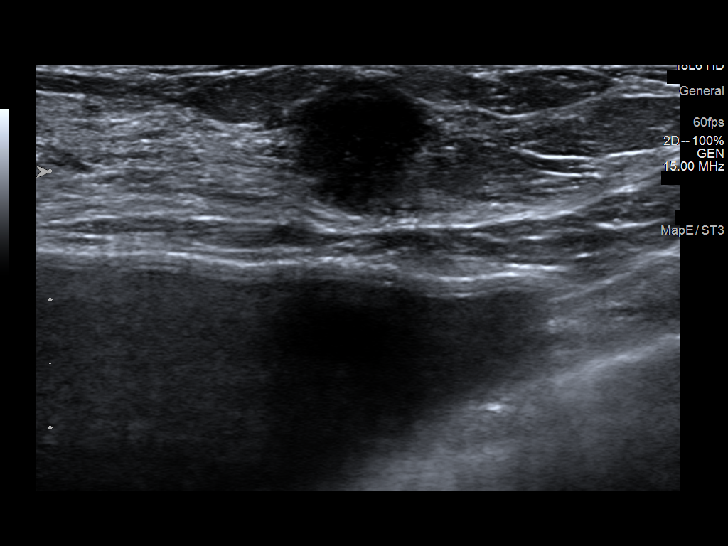
[im 4/15]
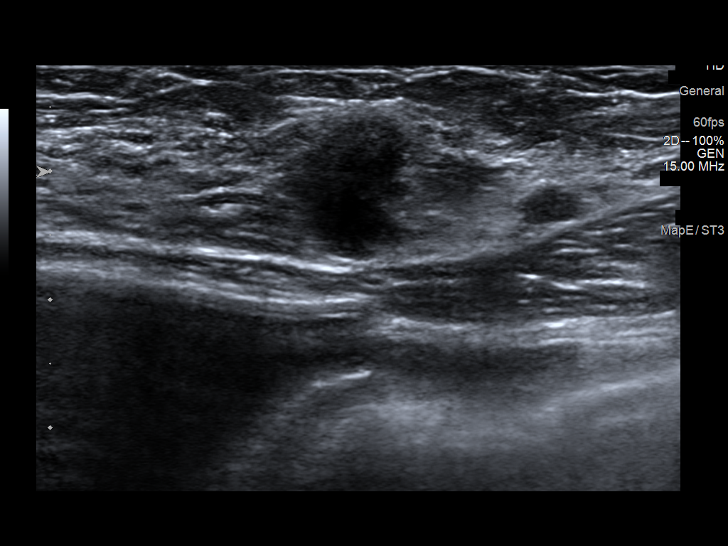
[im 5/15]
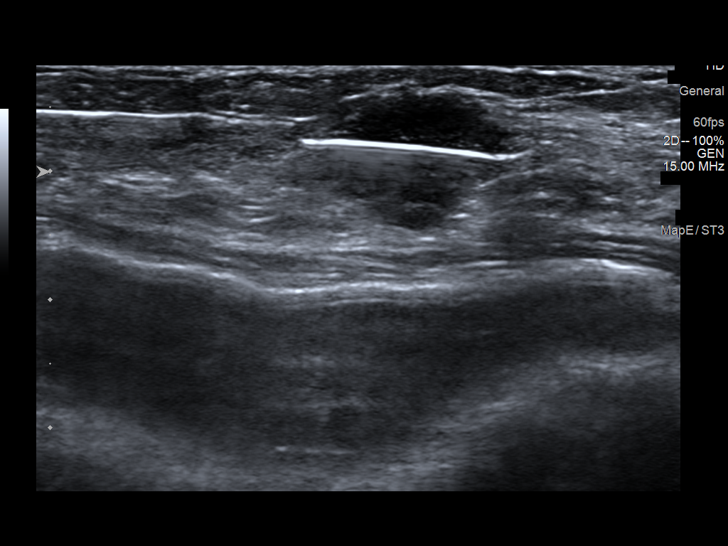
[im 6/15]
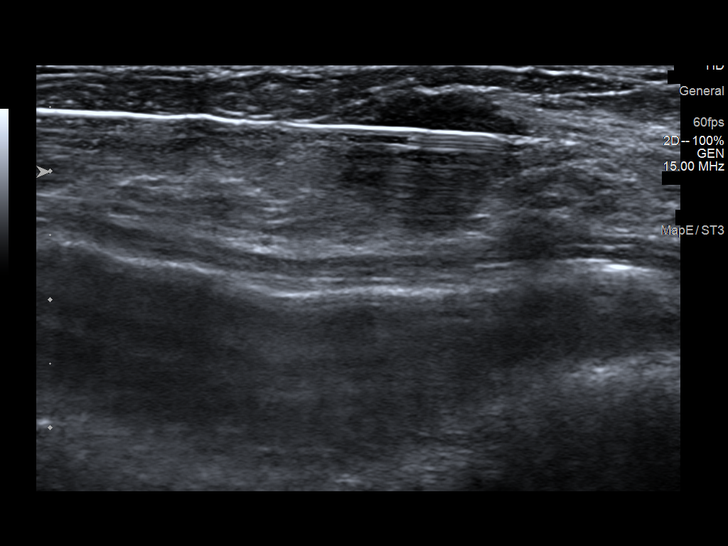
[im 7/15]
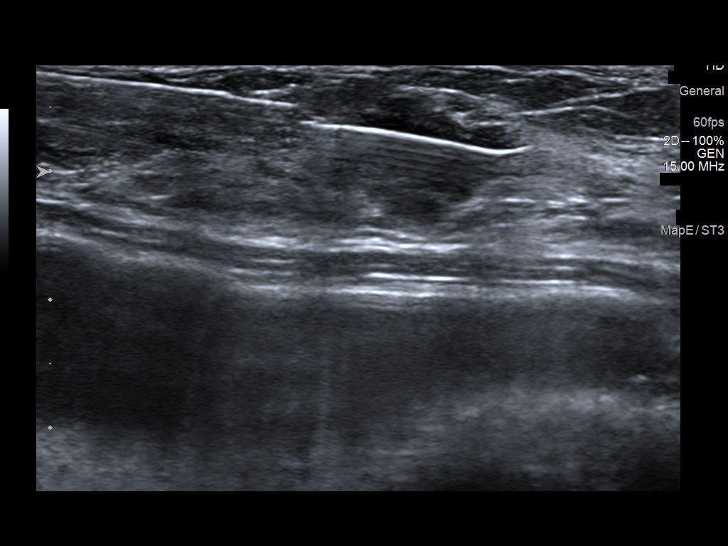
[im 9/15]
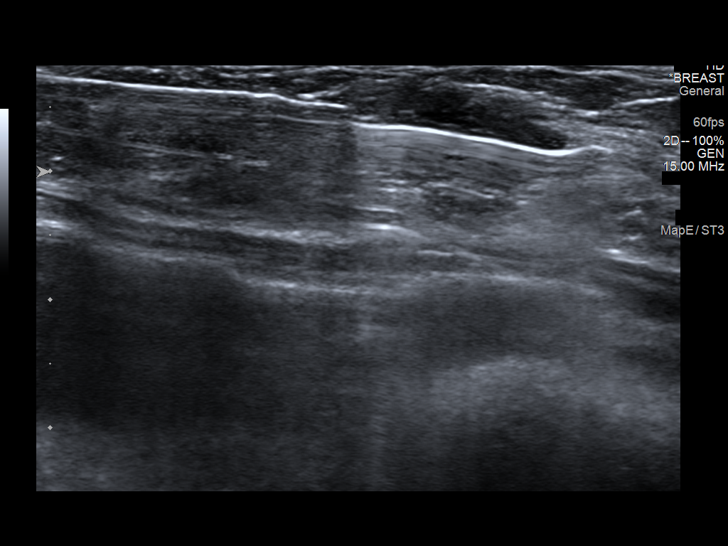
[im 10/15]
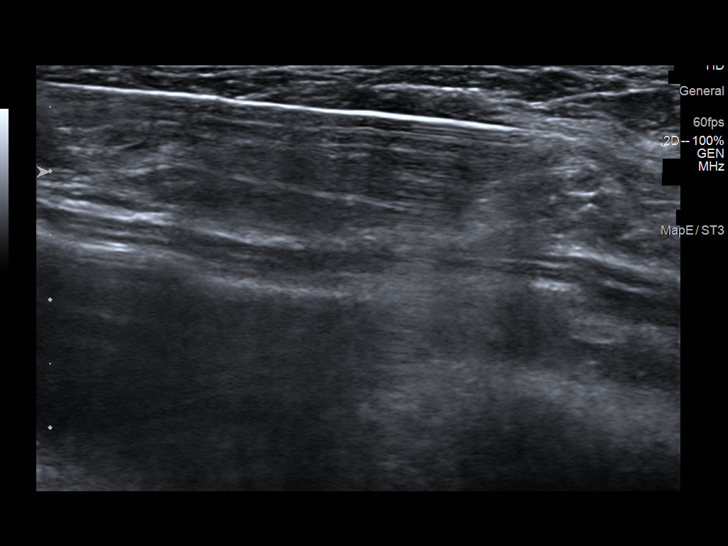
[im 11/15]
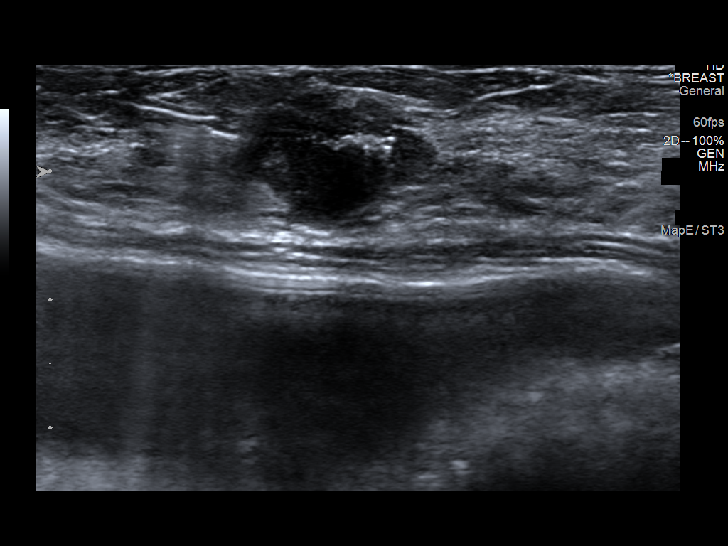
[im 12/15]
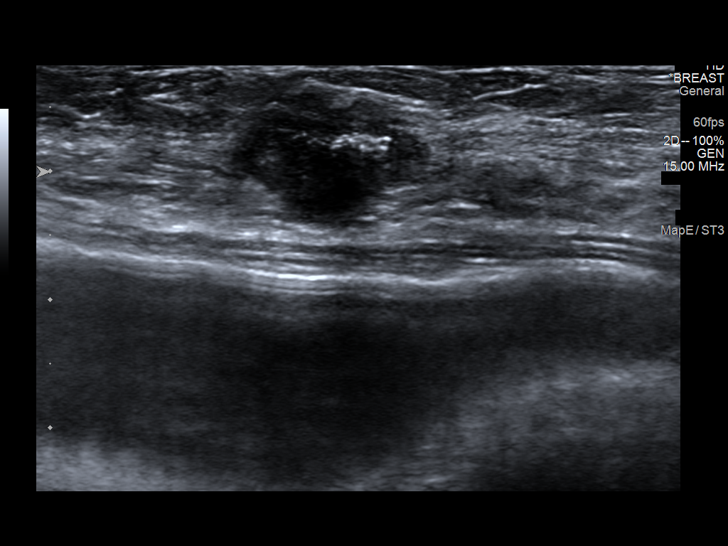
[im 14/15]
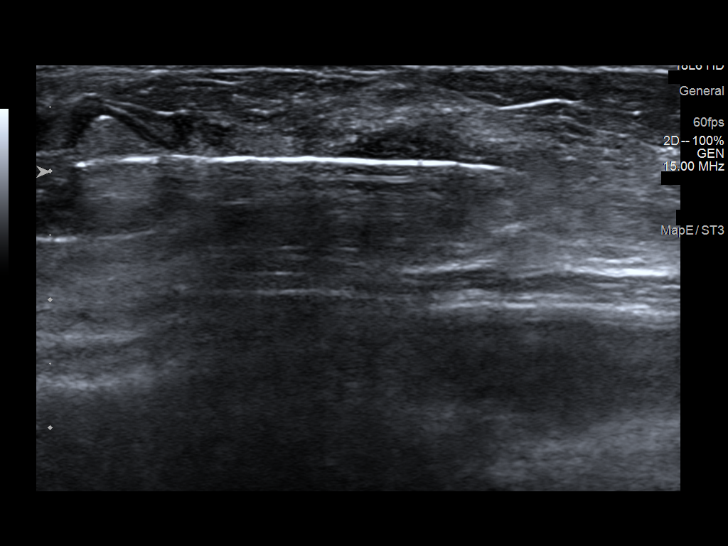
[im 15/15]
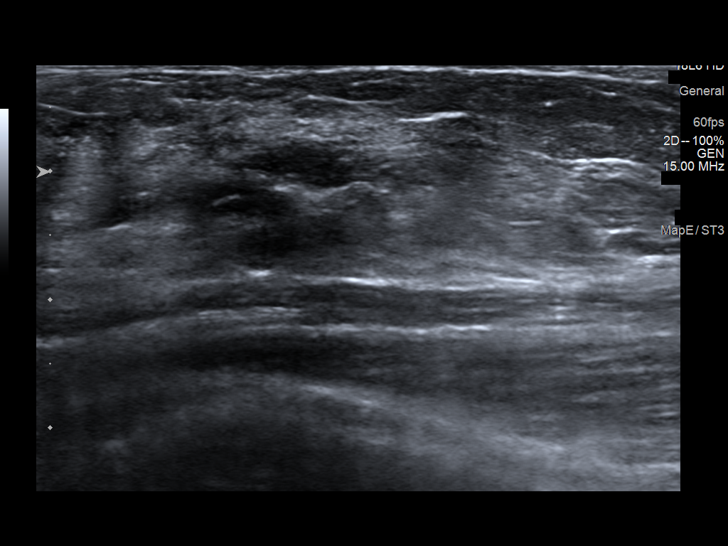

[12 of 15 positions shown; findings below may reference images not displayed]



Lesion quadrant: Upper inner quadrant

Using sterile technique and 1% Lidocaine as local anesthetic, under
direct ultrasound visualization, a 14 gauge ELIER device was
used to perform biopsy of a mass at the [DATE] position 5 cm from the
nipple using a inferior approach. At the conclusion of the procedure
a ribbon shaped tissue marker clip was deployed into the biopsy
cavity.

Lesion quadrant: Upper inner quadrant

Using sterile technique and 1% Lidocaine as local anesthetic, under
direct ultrasound visualization, a 14 gauge ELIER device was
used to perform biopsy of a mass at the 11 o'clock position 4 cm
from the nipple using a lateral approach. At the conclusion of the
procedure a coil shaped tissue marker clip was deployed into the
biopsy cavity.

Follow up 2 view mammogram was performed and dictated separately.
IMPRESSION: Ultrasound guided biopsy of the left breast x2. No apparent
complications.

ADDENDUM:
Pathology revealed GRADE II INVASIVE MAMMARY CARCINOMA, MAMMARY
CARCINOMA IN SITU of the LEFT breast, 9:30 o'clock, 5 cmfn, (ribbon
clip). This was found to be concordant by Dr. ELIER.

Pathology revealed GRADE II INVASIVE MAMMARY CARCINOMA, MAMMARY
CARCINOMA IN SITU of the LEFT breast, 11:00 o'clock, 4 cmfn, (coil
clip). This was found to be concordant by Dr. ELIER.

Pathology results were discussed with the patient by telephone. The
patient reported doing well after the biopsies with tenderness at
the sites. Post biopsy instructions and care were reviewed and
questions were answered. The patient was encouraged to call The
direct phone number was provided.

Surgical consultation has been arranged with Dr. ELIER,
per patient request, at [REDACTED] on [DATE].

Medical oncology consultation has been arranged with Dr. ELIER

Recommendation for a bilateral breast MRI to exclude any additional
sites of disease given the patient's age, breast density and family
history.

NOTE: Additional biopsies maybe performed if this will alter
treatment plan.

Pathology results reported by ELIER, RN on [DATE].



Lesion quadrant: Upper inner quadrant

Using sterile technique and 1% Lidocaine as local anesthetic, under
direct ultrasound visualization, a 14 gauge ELIER device was
used to perform biopsy of a mass at the [DATE] position 5 cm from the
nipple using a inferior approach. At the conclusion of the procedure
a ribbon shaped tissue marker clip was deployed into the biopsy
cavity.

Lesion quadrant: Upper inner quadrant

Using sterile technique and 1% Lidocaine as local anesthetic, under
direct ultrasound visualization, a 14 gauge ELIER device was
used to perform biopsy of a mass at the 11 o'clock position 4 cm
from the nipple using a lateral approach. At the conclusion of the
procedure a coil shaped tissue marker clip was deployed into the
biopsy cavity.

Follow up 2 view mammogram was performed and dictated separately.
IMPRESSION: Ultrasound guided biopsy of the left breast x2. No apparent
complications.

## 2022-03-04 IMAGING — US US BREAST*R* LIMITED INC AXILLA
1 series · 8 of 8 positions shown · non-contrast
Comparison: Previous exam(s).

CLINICAL DATA: 41-year-old female for follow-up of probably benign
right breast masses.

EXAM:
DIGITAL DIAGNOSTIC UNILATERAL RIGHT MAMMOGRAM WITH IMPLANTS, CAD AND
TOMOSYNTHESIS; ULTRASOUND RIGHT BREAST LIMITED
TECHNIQUE: Right digital diagnostic mammography and breast tomosynthesis was
performed. The images were evaluated with computer-aided detection.
Standard and/or implant displaced views were performed.; Targeted
ultrasound examination of the right breast was performed

[Series 1: us breast*right* limited inc axilla · 0.06mm/px · 8 of 8 slices shown]
[im 1/8]
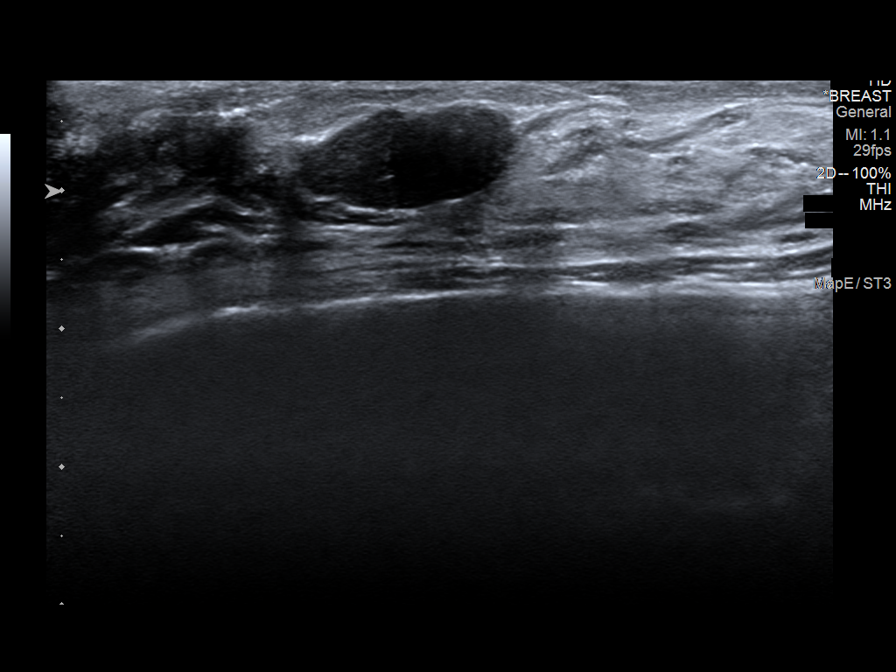
[im 2/8]
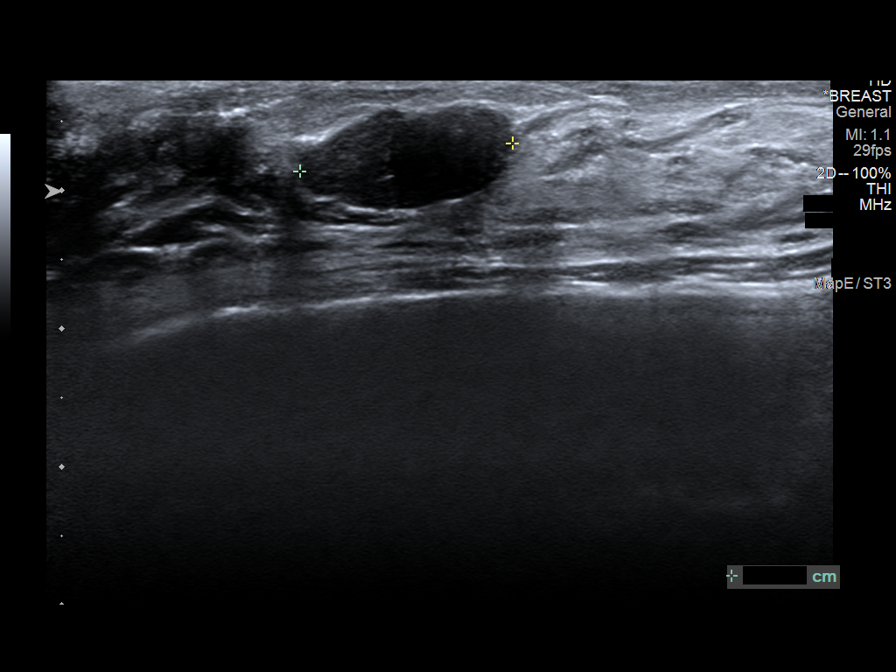
[im 3/8]
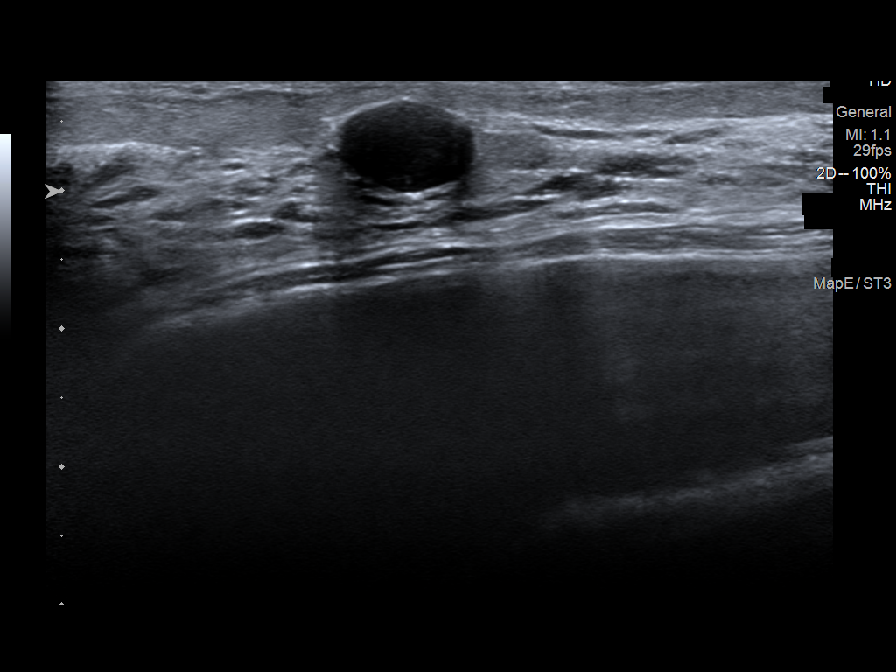
[im 4/8]
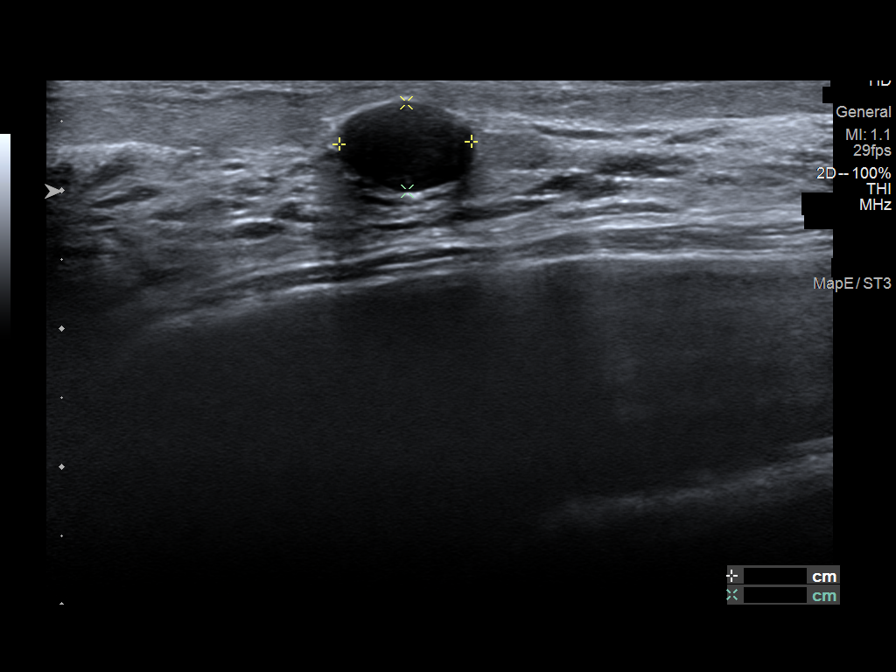
[im 5/8]
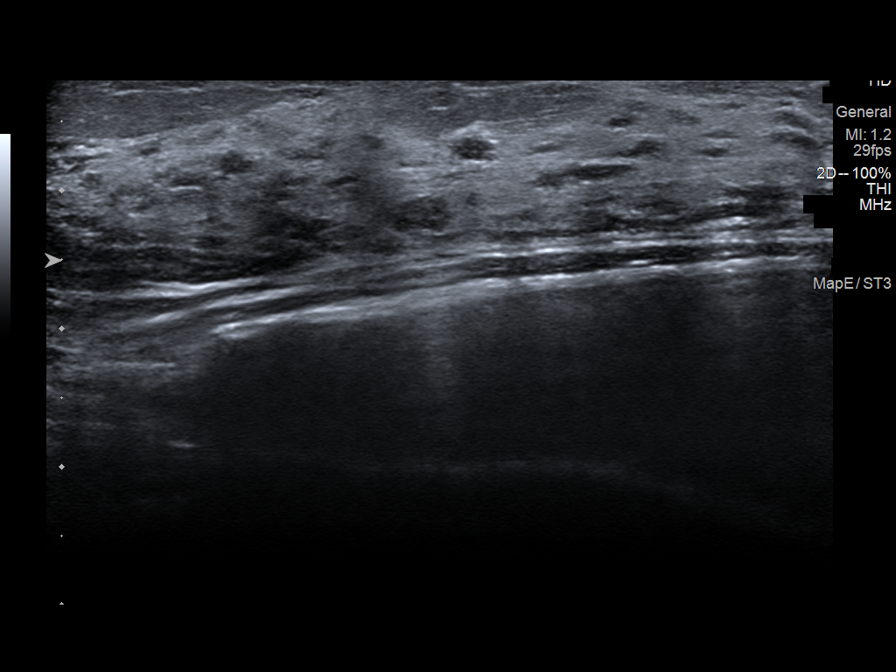
[im 6/8]
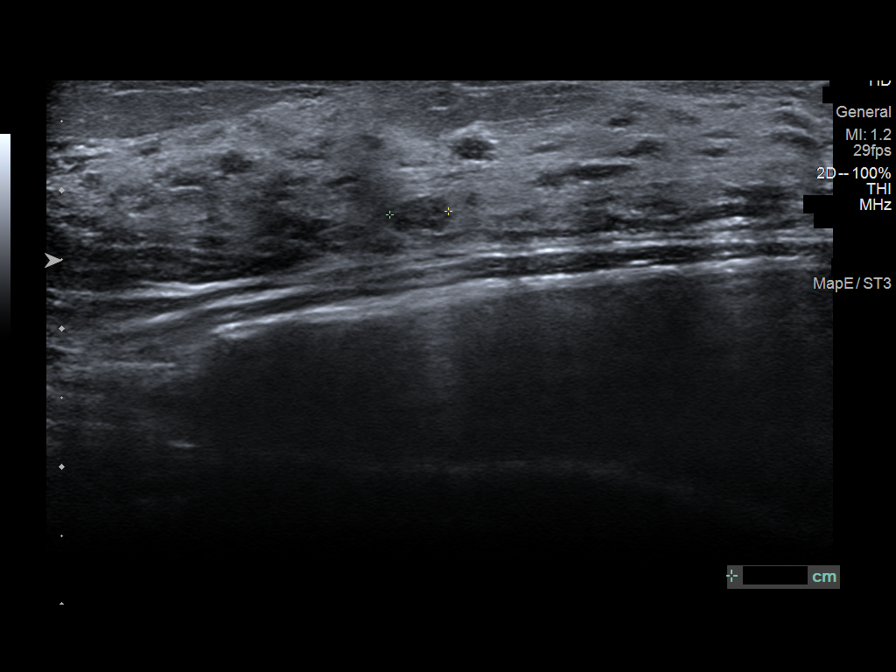
[im 7/8]
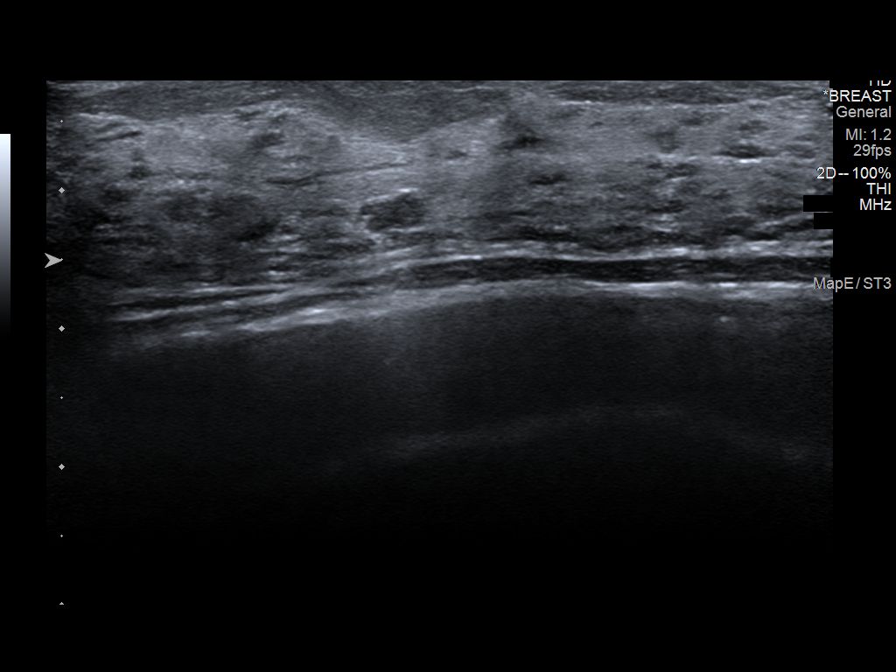
[im 8/8]
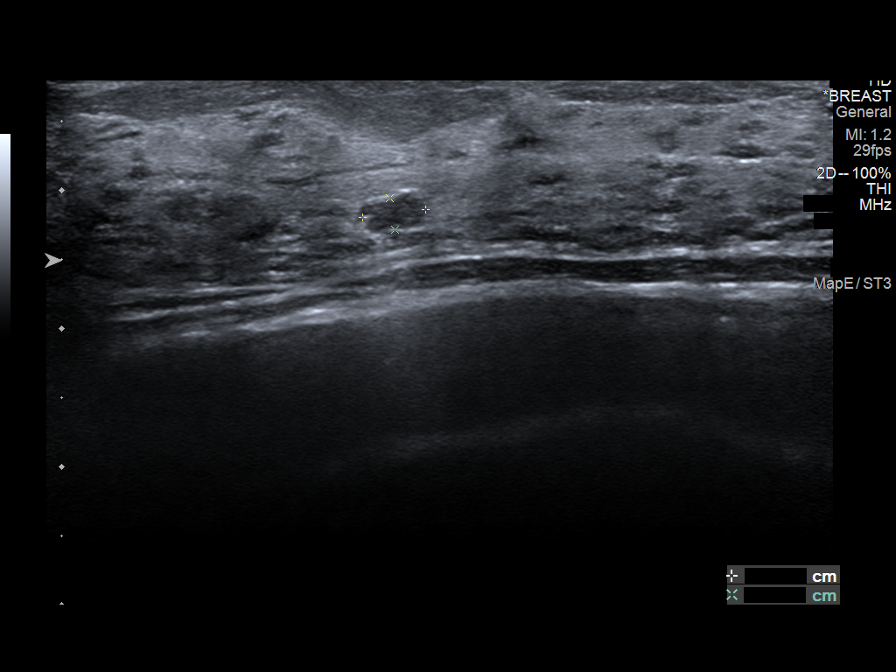

[8 of 8 positions shown; findings below may reference images not displayed]

ACR Breast Density Category d: The breast tissue is extremely dense,
which lowers the sensitivity of mammography.
FINDINGS: Circumscribed isodense mass in the subareolar right breast is
mammographically stable. Otherwise, no new or suspicious findings in
the remainder of the right breast. The patient has retropectoral
implants.

Targeted ultrasound is performed, showing stable appearance of an
oval, circumscribed hypoechoic mass at the 1 o'clock position 1 cm
from the nipple. It measures 1.6 x 1.0 x 0.6 cm (previously 1.7 x
1.0 x 0.6 cm). An additional mass at the [DATE] position 2 cm from
the nipple has decreased in size, now measuring 5 x 4 x 2 mm
(previously 7 x 5 x 6 mm).
IMPRESSION: 1. No mammographic evidence of malignancy on the right.
2. Benign right breast masses demonstrating 2 year
stability/interval decrease in size. No further follow-up required.

RECOMMENDATION:
Final recommendation will be based on the results of the patient's
recent left breast biopsies.

I have discussed the findings and recommendations with the patient.
If applicable, a reminder letter will be sent to the patient
regarding the next appointment.

BI-RADS CATEGORY  2: Benign.

## 2022-03-04 MED ORDER — ALPRAZOLAM 0.25 MG PO TABS: 0.2500 mg | ORAL_TABLET | Freq: Every evening | ORAL | 0 refills | Status: DC | PRN

## 2022-03-04 NOTE — Telephone Encounter (Signed)
Patient called and would like to have a Rx for Xanax 0.'25mg'$  called in. She is experiencing a lot of anxiety concerning breast biopsy. She was treated for a panic attack on Friday after having to pull off the side of the road. She was escorted by ambulance. There she was given Klonopin which is to strong. ? ?Rx has been sent to requested pharmacy by Dr. Sabra Heck. tbw ?

## 2022-03-05 ENCOUNTER — Telehealth: Payer: Self-pay | Admitting: *Deleted

## 2022-03-05 ENCOUNTER — Encounter: Payer: Self-pay | Admitting: *Deleted

## 2022-03-05 ENCOUNTER — Other Ambulatory Visit (HOSPITAL_BASED_OUTPATIENT_CLINIC_OR_DEPARTMENT_OTHER): Payer: Self-pay | Admitting: Obstetrics & Gynecology

## 2022-03-05 DIAGNOSIS — Z853 Personal history of malignant neoplasm of breast: Secondary | ICD-10-CM

## 2022-03-05 NOTE — Telephone Encounter (Signed)
Called pt, no answer, left vm with contact information and request return call to schedule appt with Dr. Lindi Adie on 5/1 at 3:45pm. ?

## 2022-03-05 NOTE — Telephone Encounter (Signed)
Spoke to pt, provided navigation resources and contact information. Scheduled and confirmed new pt appt with Dr. Lindi Adie on 03/10/22 at 3:45pm. No further questions voiced at this time. ?

## 2022-03-06 ENCOUNTER — Ambulatory Visit
Admission: RE | Admit: 2022-03-06 | Discharge: 2022-03-06 | Disposition: A | Discharge status: Home / Self Care | Payer: 59 | Source: Ambulatory Visit | Attending: Obstetrics & Gynecology | Admitting: Obstetrics & Gynecology

## 2022-03-06 ENCOUNTER — Other Ambulatory Visit: Payer: 59

## 2022-03-06 DIAGNOSIS — Z853 Personal history of malignant neoplasm of breast: Secondary | ICD-10-CM

## 2022-03-06 IMAGING — MR MR BREAST BILAT WO/W CM
8 of 12 series · 31 of 48 positions shown · IV contrast (gadavist)
Comparison: [DATE]

CLINICAL DATA: Initial workup for breast cancer. Family history of
breast cancer in maternal grandmother (after age 60), paternal aunt
(postmenopausal). On recent diagnostic evaluation, multiple masses
are identified throughout the UPPER INNER QUADRANT of the LEFT
breast, largest in the 9:30 o'clock and 11 o'clock locations.
Ultrasound-guided core biopsy of 2 of these lesions both demonstrate
invasive mammary carcinoma and mammary carcinoma in situ.

EXAM:
BILATERAL BREAST MRI WITH AND WITHOUT CONTRAST
TECHNIQUE: Multiplanar, multisequence MR images of both breasts were obtained
prior to and following the intravenous administration of 7 ml of
Gadavist

[Series 2: t2_tirm_tra ipat (a-p) · axial · 3.0mm · 0.70mm/px · 1 of 55 slices shown]
[im 1/55]
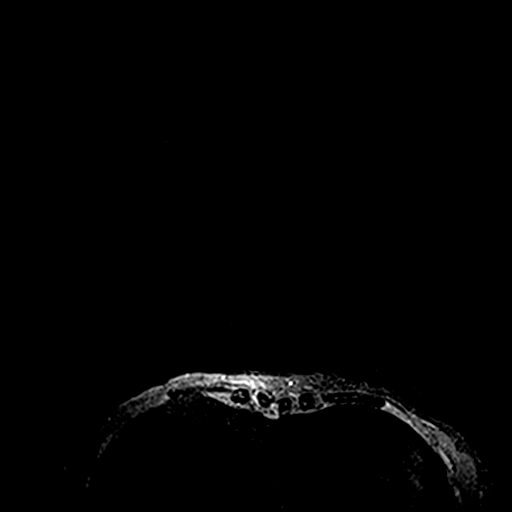

[Series 3: fl3d pre-cm no · axial · non-contrast · 1.2mm · 0.94mm/px · z∈[-84,+88]mm · 5 of 144 slices shown]
[im 1/144]
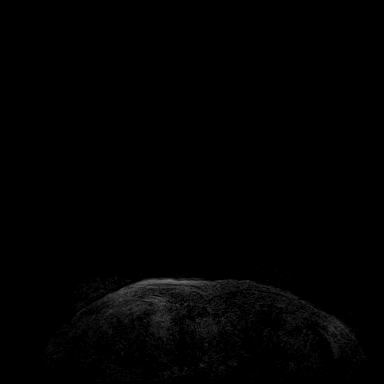
[im 36/144]
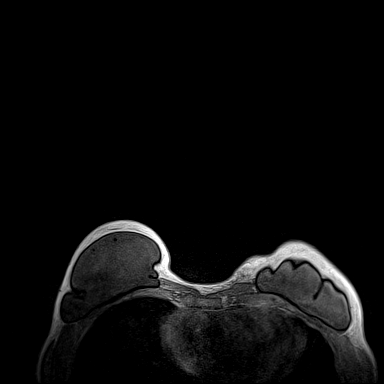
[im 72/144]
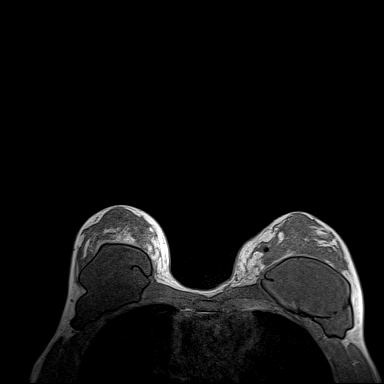
[im 108/144]
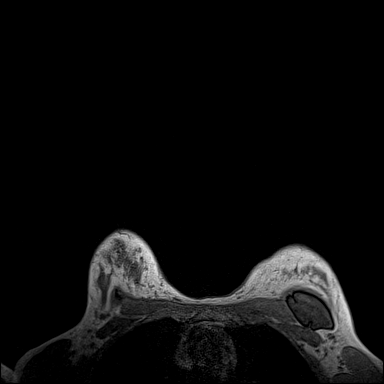
[im 144/144]
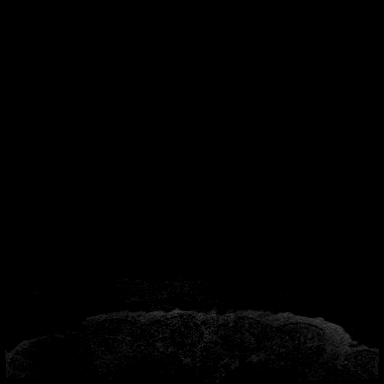

[Series 4: fl3d pre-cm · axial · non-contrast · 1.2mm · 0.94mm/px · z∈[-84,+88]mm · 5 of 144 slices shown]
[im 1/144]
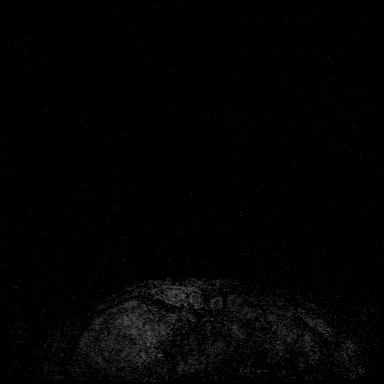
[im 36/144]
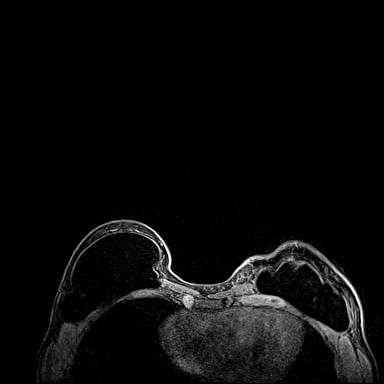
[im 72/144]
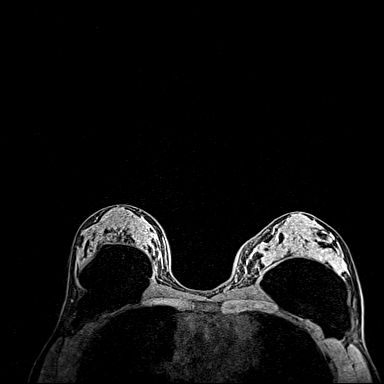
[im 108/144]
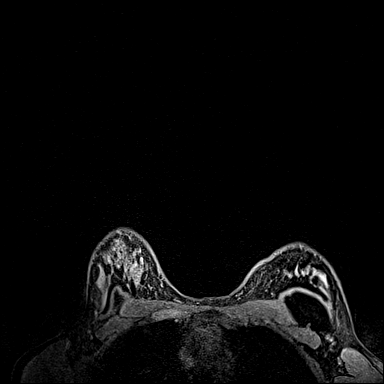
[im 144/144]
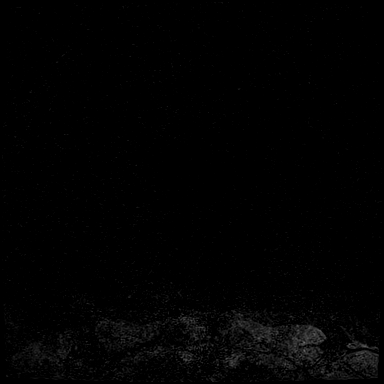

[Series 5: fl3d post immediate · axial · 1.2mm · 0.94mm/px · z∈[-84,+88]mm · 5 of 144 slices shown (1 of 3)]
[im 1/144]
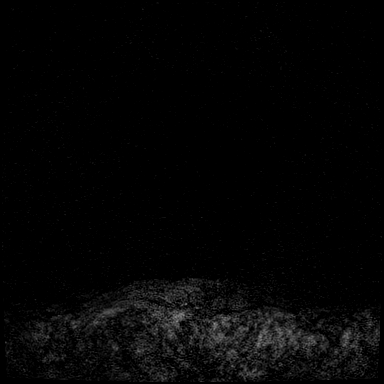
[im 36/144]
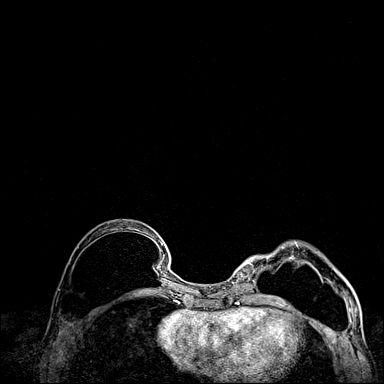
[im 72/144]
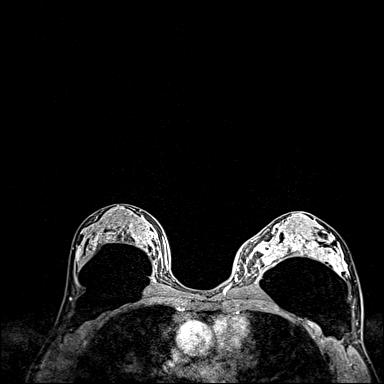
[im 108/144]
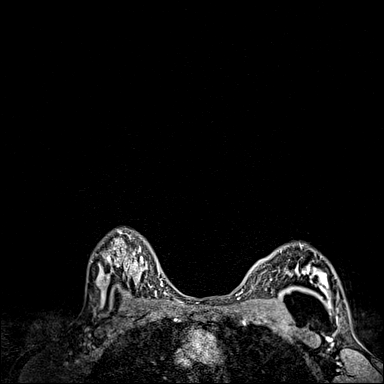
[im 144/144]
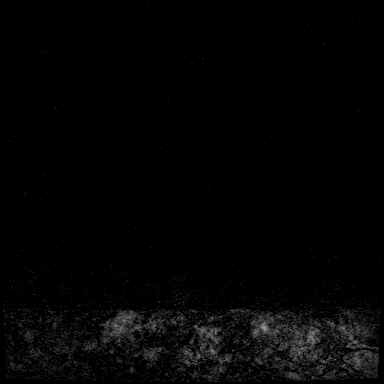

[Series 6: fl3d post immediate · axial · 1.2mm · 0.94mm/px · z∈[-84,+88]mm · 5 of 144 slices shown (2 of 3)]
[im 1/144]
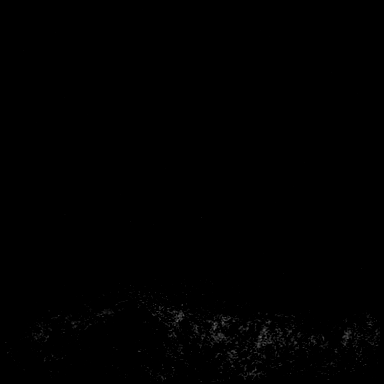
[im 36/144]
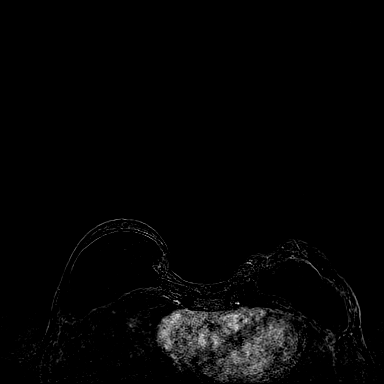
[im 72/144]
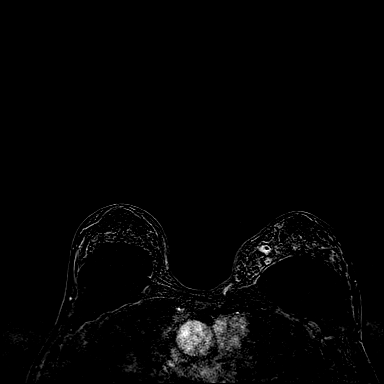
[im 108/144]
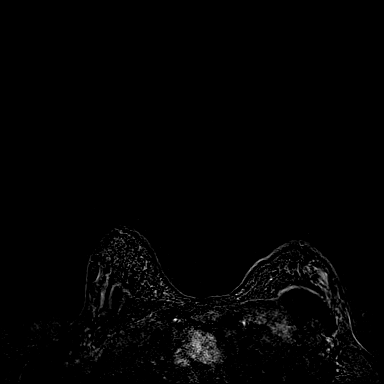
[im 144/144]
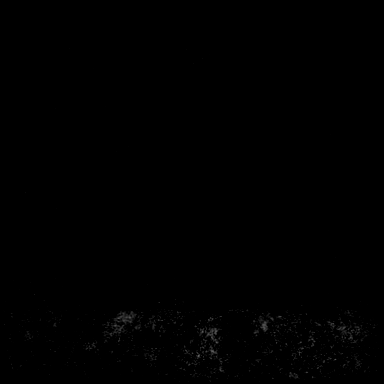

[Series 7: fl3d post immediate · axial · 172.8mm · 0.94mm/px · 1 of 1 slices shown (3 of 3)]
[im 1/1]
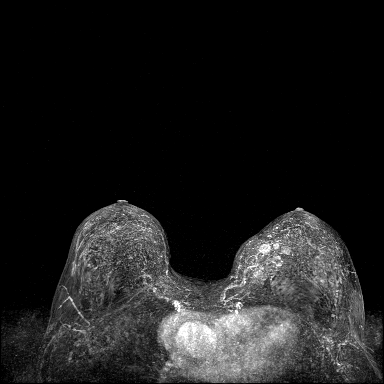

[Series 8: fl3d post 3min · axial · 1.2mm · 0.94mm/px · z∈[-84,+88]mm · 6 of 144 slices shown]
[im 1/144]
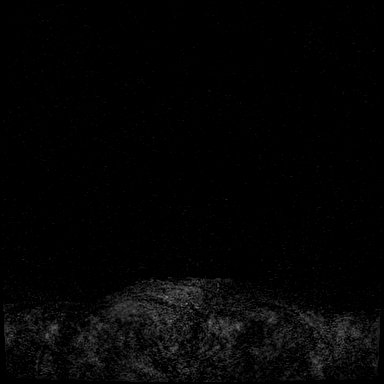
[im 29/144]
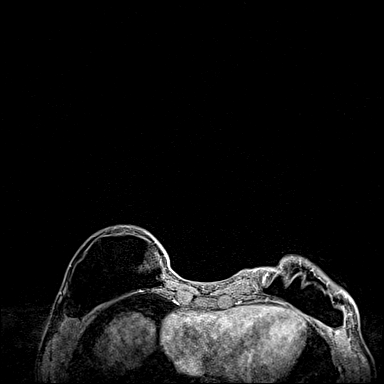
[im 58/144]
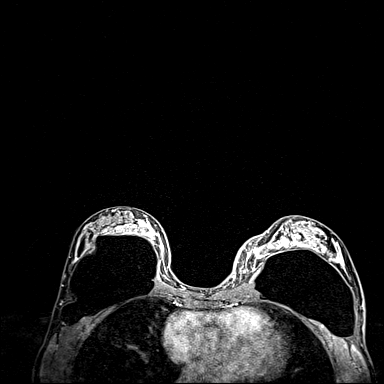
[im 86/144]
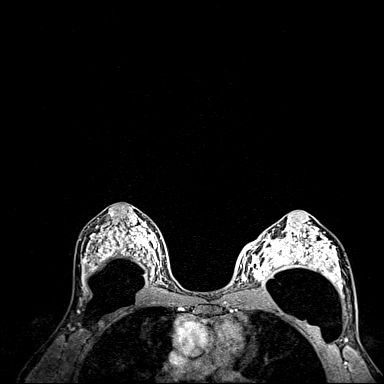
[im 115/144]
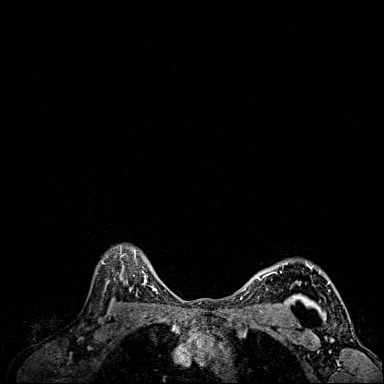
[im 144/144]
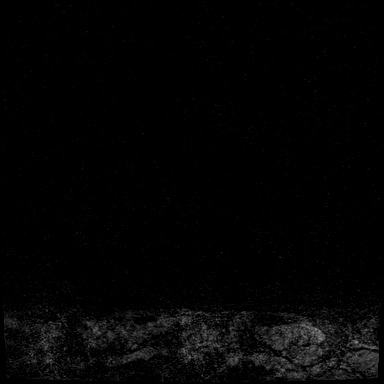

[Series 9: fl3d post 3min_sub · axial · 1.2mm · 0.94mm/px · z∈[-84,-15]mm · 3 of 144 slices shown]
[im 1/144]
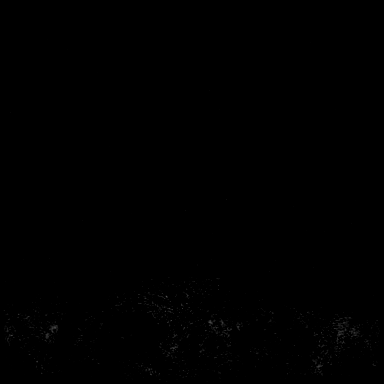
[im 29/144]
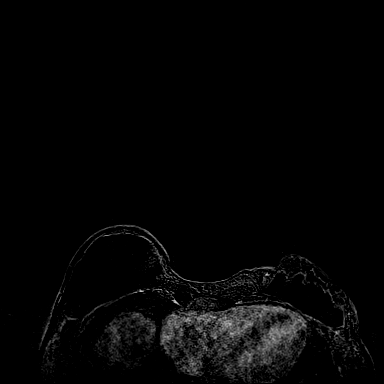
[im 58/144]
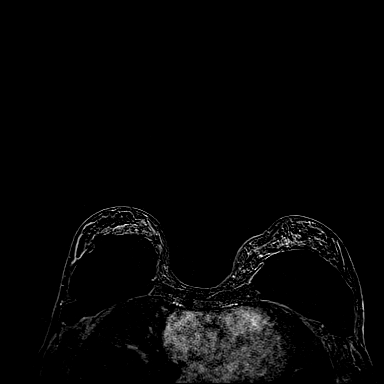

[31 of 48 positions shown; findings below may reference images not displayed]

Three-dimensional MR images were rendered by post-processing of the
original MR data on an independent workstation. The
three-dimensional MR images were interpreted, and findings are
reported in the following complete MRI report for this study. Three
dimensional images were evaluated at the independent interpreting
workstation using the DynaCAD thin client.
FINDINGS: Breast composition: c. Heterogeneous fibroglandular tissue.

Background parenchymal enhancement: Moderate.

Right breast: Retropectoral silicone implant. No mass or abnormal
enhancement.

Left breast: Retropectoral silicone implant. Numerous masses are
identified in the MEDIAL aspect of the LEFT breast, from superior to
inferior:

Oval enhancing mass in the MEDIAL mid anterior aspect of the LEFT
breast is 1.3 centimeters on image 53 of series 9.

In the MEDIAL mid aspect of the LEFT breast and marked with a coil
shaped tissue marker clip, mass is 1.4 centimeters on image 54
series 9.

In the MEDIAL mid aspect of the LEFT breast, an enhancing oval mass
is 0.7 centimeters on image 58 of series.

Within the far MEDIAL posterior aspect of the LEFT breast, 2
adjacent lesions are 1.6 centimeters and 0.5 centimeters on images
60 and 63 of series 9.

In the LOWER INNER QUADRANT of the LEFT breast, an enhancing oval
mass marked with a tissue marker clip measures 1.1 centimeters on
image 76 of series 9.

MEDIAL mid aspect in the of the LEFT breast an oval enhancing mass
is 0.8 centimeters on image 72 of series 9.

Masses demonstrate a combination of washout and plateau kinetics.

Lymph nodes: No abnormal appearing lymph nodes.

Ancillary findings:  None.
IMPRESSION: 1. Total of 7 masses within the MEDIAL quadrants of the LEFT breast,
measuring up to 1.6 centimeters. Two of these have been biopsied,
showing mammary carcinoma and mammary carcinoma in situ.
2. No adenopathy.
3. RIGHT breast is negative.
4. Bilateral retropectoral silicone implants.

RECOMMENDATION:
Recommend treatment plan for known LEFT breast malignancy.

BI-RADS CATEGORY  6: Known biopsy-proven malignancy.

## 2022-03-06 MED ORDER — GADOBUTROL 1 MMOL/ML IV SOLN
7.0000 mL | Freq: Once | INTRAVENOUS | Status: AC | PRN
  Administered 2022-03-06: 7 mL via INTRAVENOUS

## 2022-03-10 ENCOUNTER — Inpatient Hospital Stay: Payer: 59 | Attending: Hematology and Oncology | Admitting: Hematology and Oncology

## 2022-03-10 ENCOUNTER — Other Ambulatory Visit: Payer: Self-pay

## 2022-03-10 VITALS — BP 148/81 | HR 62 | Temp 97.7°F | Resp 18 | Ht 68.0 in | Wt 159.5 lb

## 2022-03-10 DIAGNOSIS — C50012 Malignant neoplasm of nipple and areola, left female breast: Secondary | ICD-10-CM

## 2022-03-10 DIAGNOSIS — Z803 Family history of malignant neoplasm of breast: Secondary | ICD-10-CM | POA: Insufficient documentation

## 2022-03-10 DIAGNOSIS — Z170 Estrogen receptor positive status [ER+]: Secondary | ICD-10-CM

## 2022-03-10 DIAGNOSIS — Z5189 Encounter for other specified aftercare: Secondary | ICD-10-CM | POA: Insufficient documentation

## 2022-03-10 DIAGNOSIS — M898X9 Other specified disorders of bone, unspecified site: Secondary | ICD-10-CM | POA: Insufficient documentation

## 2022-03-10 DIAGNOSIS — R21 Rash and other nonspecific skin eruption: Secondary | ICD-10-CM | POA: Diagnosis not present

## 2022-03-10 DIAGNOSIS — Z5111 Encounter for antineoplastic chemotherapy: Secondary | ICD-10-CM | POA: Insufficient documentation

## 2022-03-10 DIAGNOSIS — R5383 Other fatigue: Secondary | ICD-10-CM | POA: Diagnosis not present

## 2022-03-10 MED ORDER — ONDANSETRON HCL 8 MG PO TABS: 8.0000 mg | ORAL_TABLET | Freq: Two times a day (BID) | ORAL | 1 refills | Status: DC | PRN

## 2022-03-10 MED ORDER — LIDOCAINE-PRILOCAINE 2.5-2.5 % EX CREA: TOPICAL_CREAM | CUTANEOUS | 3 refills | Status: DC

## 2022-03-10 MED ORDER — PROCHLORPERAZINE MALEATE 10 MG PO TABS: 10.0000 mg | ORAL_TABLET | Freq: Four times a day (QID) | ORAL | 1 refills | Status: DC | PRN

## 2022-03-10 MED ORDER — DEXAMETHASONE 4 MG PO TABS: 4.0000 mg | ORAL_TABLET | Freq: Two times a day (BID) | ORAL | 0 refills | Status: DC

## 2022-03-10 NOTE — Progress Notes (Signed)
START OFF PATHWAY REGIMEN - Breast ? ? ?[id number]:Docetaxel + Carboplatin + Trastuzumab [facility]): ?  A cycle is every 21 days: ?    Trastuzumab-xxxx  ?    Trastuzumab-xxxx  ?    Docetaxel  ?    Carboplatin  ? ?**Always confirm dose/schedule in your pharmacy ordering system** ? ?Patient Characteristics: ?Preoperative or Nonsurgical Candidate (Clinical Staging), Neoadjuvant Therapy followed by Surgery, Invasive Disease, Chemotherapy, HER2 Positive, ER Positive ?Therapeutic Status: Preoperative or Nonsurgical Candidate (Clinical Staging) ?AJCC M Category: cM0 ?AJCC Grade: G2 ?Breast Surgical Plan: Neoadjuvant Therapy followed by Surgery ?ER Status: Positive (+) ?AJCC 8 Stage Grouping: IA ?HER2 Status: Positive (+) ?AJCC T Category: cT1c ?AJCC N Category: cN0 ?PR Status: Positive (+) ?Intent of Therapy: ?Curative Intent, Discussed with Patient ?
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
## 2022-03-10 NOTE — Assessment & Plan Note (Signed)
03/04/2022 History of subpectoral implants, palpable left breast mass by ultrasound 1.4 cm.  Multiple additional masses 1.1 and 0.9 cm.  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  Breast MRI showed at least 7 different masses largest 1.6 cm.  No lymphadenopathy ? ?Pathology and radiology counseling: Discussed with the patient, the details of pathology including the type of breast cancer,the clinical staging, the significance of ER, PR and HER-2/neu receptors and the implications for treatment. After reviewing the pathology in detail, we proceeded to discuss the different treatment options between surgery, radiation, chemotherapy, antiestrogen therapies. ? ?Recommendation based on multidisciplinary tumor board: ?1. Neoadjuvant chemotherapy with [place]  ?6 cycles followed by Herceptin maintenance versus Kadcyla maintenance (based on response to neoadjuvant chemo) for 1 year ?2. Followed by mastectomy with sentinel lymph node study ?3. Followed by adjuvant antiestrogen therapy ? ?Chemotherapy Counseling: I discussed the risks and benefits of chemotherapy including the risks of nausea/ vomiting, risk of infection from low WBC count, fatigue due to chemo or anemia, bruising or bleeding due to low platelets, mouth sores, loss/ change in taste and decreased appetite. Liver and kidney function will be monitored through out chemotherapy as abnormalities in liver and kidney function may be a side effect of treatment. Cardiac dysfunction due to Herceptin   and neuropathy risk from Taxotere were discussed in detail. Risk of permanent bone marrow dysfunction due to chemo were also discussed. ? ?Plan: ?1. Port placement ?2. Echocardiogram ?3. Chemotherapy class ? ?Patient is interested in participating in the blood draw study as well as a nausea studies. ? ?Start chemotherapy 03/18/2022. ? ?

## 2022-03-11 ENCOUNTER — Encounter: Payer: Self-pay | Admitting: Hematology and Oncology

## 2022-03-11 ENCOUNTER — Ambulatory Visit: Payer: Self-pay | Admitting: Neurology

## 2022-03-11 ENCOUNTER — Telehealth: Payer: Self-pay | Admitting: Neurology

## 2022-03-11 ENCOUNTER — Encounter: Payer: Self-pay | Admitting: *Deleted

## 2022-03-11 ENCOUNTER — Encounter: Payer: Self-pay | Admitting: Neurology

## 2022-03-11 ENCOUNTER — Encounter (HOSPITAL_BASED_OUTPATIENT_CLINIC_OR_DEPARTMENT_OTHER): Payer: Self-pay | Admitting: General Surgery

## 2022-03-11 ENCOUNTER — Ambulatory Visit (HOSPITAL_BASED_OUTPATIENT_CLINIC_OR_DEPARTMENT_OTHER): Payer: 59 | Admitting: Advanced Practice Midwife

## 2022-03-11 ENCOUNTER — Encounter (HOSPITAL_BASED_OUTPATIENT_CLINIC_OR_DEPARTMENT_OTHER): Payer: Self-pay | Admitting: Advanced Practice Midwife

## 2022-03-11 ENCOUNTER — Telehealth: Payer: Self-pay | Admitting: Radiology

## 2022-03-11 ENCOUNTER — Other Ambulatory Visit: Payer: Self-pay | Admitting: Neurology

## 2022-03-11 ENCOUNTER — Other Ambulatory Visit: Payer: Self-pay | Admitting: General Surgery

## 2022-03-11 VITALS — BP 145/79 | HR 67 | Ht 68.0 in | Wt 156.6 lb

## 2022-03-11 VITALS — BP 125/80 | HR 65 | Ht 68.0 in | Wt 158.4 lb

## 2022-03-11 DIAGNOSIS — R531 Weakness: Secondary | ICD-10-CM

## 2022-03-11 DIAGNOSIS — R449 Unspecified symptoms and signs involving general sensations and perceptions: Secondary | ICD-10-CM

## 2022-03-11 DIAGNOSIS — C50012 Malignant neoplasm of nipple and areola, left female breast: Secondary | ICD-10-CM

## 2022-03-11 DIAGNOSIS — R519 Headache, unspecified: Secondary | ICD-10-CM

## 2022-03-11 DIAGNOSIS — Z30432 Encounter for removal of intrauterine contraceptive device: Secondary | ICD-10-CM | POA: Diagnosis not present

## 2022-03-11 DIAGNOSIS — H539 Unspecified visual disturbance: Secondary | ICD-10-CM

## 2022-03-11 DIAGNOSIS — C50919 Malignant neoplasm of unspecified site of unspecified female breast: Secondary | ICD-10-CM

## 2022-03-11 DIAGNOSIS — C7949 Secondary malignant neoplasm of other parts of nervous system: Secondary | ICD-10-CM

## 2022-03-11 DIAGNOSIS — R292 Abnormal reflex: Secondary | ICD-10-CM

## 2022-03-11 DIAGNOSIS — R200 Anesthesia of skin: Secondary | ICD-10-CM

## 2022-03-11 DIAGNOSIS — C50912 Malignant neoplasm of unspecified site of left female breast: Secondary | ICD-10-CM

## 2022-03-11 NOTE — Patient Instructions (Signed)
MRI brain and mri cervical spine ? ? ?

## 2022-03-11 NOTE — Telephone Encounter (Signed)
[id number] - TREATMENT OF REFRACTORY NAUSEA  ? ?03/11/2022 ? ?PHONE CALL: Confirmed I was speaking with Loma Newton . Introduced myself and informed patient reason for call is to discuss interest in the above mentioned study. Patient is very interested. This coordinator e-mailed consent and HIPAA documents to e-mail address on file. Confirmed with patient usage of Xanax. Patient states she takes Xanax PRN and has not taken a pill since yesterday (5/1). Patient begins chemo on 5/9. Patient confirms she will not take Xanax during [facility] study or prior to study.  Plan is to see patient prior to patient education on Thursday 5/4 at 8:15AM. Thanked patient for her time and consideration for the above mentioned study.  ? ?Carol Ada, RT(R)(T) ?Clinical Research Coordinator ? ?

## 2022-03-11 NOTE — Progress Notes (Signed)
Pharmacist Chemotherapy Monitoring - Initial Assessment   ? ?Anticipated start date: 03/18/22  ? ?The following has been reviewed per standard work regarding the patient's treatment regimen: ?The patient's diagnosis, treatment plan and drug doses, and organ/hematologic function ?Lab orders and baseline tests specific to treatment regimen  ?The treatment plan start date, drug sequencing, and pre-medications ?Prior authorization status  ?Patient's documented medication list, including drug-drug interaction screen and prescriptions for anti-emetics and supportive care specific to the treatment regimen ?The drug concentrations, fluid compatibility, administration routes, and timing of the medications to be used ?The patient's access for treatment and lifetime cumulative dose history, if applicable  ?The patient's medication allergies and previous infusion related reactions, if applicable  ? ?Changes made to treatment plan:  ?N/A ? ?Follow up needed:  ?Pending authorization for treatment  ? ? ?Larene Beach [facility], ?03/11/2022  2:39 PM ? ?
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
## 2022-03-11 NOTE — Progress Notes (Signed)
? ? ?  GYNECOLOGY CLINIC PROCEDURE NOTE ? ?Ms. [name] is a 41 y.o. [name] here for Mirena IUD removal. She has recent diagnosis of breast cancer and is beginning chemotherapy treatments. Her oncologist recommended removal of hormonal IUD.  No other GYN concerns.  Last pap smear was on 03/27/20 and was normal. ? ?IUD Removal  ?Patient was in the dorsal lithotomy position, normal external genitalia was noted.  A speculum was placed in the patient's vagina, normal discharge was noted, no lesions. The cervix was visualized, no lesions, no abnormal discharge.  The strings of the IUD were grasped and pulled using ring forceps. The IUD was removed in its entirety. Patient tolerated the procedure well.   ? ?Patient will use abstinence for contraception. Routine preventative health maintenance measures emphasized. ? ? ?No follow-ups on file.  ? ?Fatima Blank, CNM ?12:59 PM   ?

## 2022-03-11 NOTE — Telephone Encounter (Signed)
[employer] pending.  ?

## 2022-03-11 NOTE — Telephone Encounter (Signed)
[employer] 2021-05 - Specimen Collection Study to Evaluate Biomarkers in Subjects with Cancer   ? ?03/11/2022 ? ?PHONE CALL: Confirmed I was speaking with [name] . Introduced myself and informed patient reason for call is to discuss interest in the above mentioned study. Patient is very interested. This coordinator e-mailed consent and HIPAA documents to e-mail address on file. Plan to see patient on Thursday 5/4 at 8:15AM. Patient thanked for her time and consideration in the above mentioned study.  ? ?Carol Ada, RT(R)(T) ?Clinical Research Coordinator ? ?

## 2022-03-12 ENCOUNTER — Ambulatory Visit: Payer: 59 | Attending: Hematology and Oncology | Admitting: Physical Therapy

## 2022-03-12 ENCOUNTER — Ambulatory Visit (HOSPITAL_COMMUNITY)
Admission: RE | Admit: 2022-03-12 | Discharge: 2022-03-12 | Disposition: A | Discharge status: Home / Self Care | Payer: 59 | Source: Ambulatory Visit | Attending: Hematology and Oncology | Admitting: Hematology and Oncology

## 2022-03-12 ENCOUNTER — Other Ambulatory Visit: Payer: 59

## 2022-03-12 ENCOUNTER — Other Ambulatory Visit: Payer: Self-pay | Admitting: Adult Health

## 2022-03-12 DIAGNOSIS — R293 Abnormal posture: Secondary | ICD-10-CM | POA: Insufficient documentation

## 2022-03-12 DIAGNOSIS — Z170 Estrogen receptor positive status [ER+]: Secondary | ICD-10-CM | POA: Insufficient documentation

## 2022-03-12 DIAGNOSIS — Z01818 Encounter for other preprocedural examination: Secondary | ICD-10-CM | POA: Insufficient documentation

## 2022-03-12 DIAGNOSIS — C50012 Malignant neoplasm of nipple and areola, left female breast: Secondary | ICD-10-CM | POA: Diagnosis not present

## 2022-03-12 DIAGNOSIS — I351 Nonrheumatic aortic (valve) insufficiency: Secondary | ICD-10-CM | POA: Diagnosis not present

## 2022-03-12 DIAGNOSIS — Z0189 Encounter for other specified special examinations: Secondary | ICD-10-CM | POA: Diagnosis not present

## 2022-03-12 LAB — ECHOCARDIOGRAM COMPLETE
AR max vel: 3.06 cm2
AV Peak grad: 6 mmHg
Ao pk vel: 1.22 m/s
Area-P 1/2: 3.68 cm2
P 1/2 time: 699 msec
S' Lateral: 3.6 cm

## 2022-03-12 NOTE — Progress Notes (Signed)
Called and informed patient of need to set up with Dr. Harl Bowie in cardio-oncology to review echocardiograms every 3 months.  I placed a referral, patient in agreement of plan and aware.   ? ?Wilber Bihari, NP 03/12/22 3:28 PM ?Medical Oncology and Hematology ?[address] ?[address] ?[place], [place] [address] ?Tel. [phone]    Fax. [phone] ? ?

## 2022-03-12 NOTE — Telephone Encounter (Signed)
[employer] Josem Kaufmann:  ?Brain: [id number] (exp. 03/11/22 to 09/07/22) ?Cervical: [id number] (exp. 03/12/22 to 09/07/22) ? ?Order sent to GI because we are not in network for the MRI to be done on our mobile unit.. ? ?I spoke with the patient and informed her we are not in network and that the order will be sent to [facility] and I spoke with Dr. Jaynee Eagles and she stated it is okay for the patient to get the MRI done within a week.  ?
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
## 2022-03-13 ENCOUNTER — Other Ambulatory Visit: Payer: Self-pay

## 2022-03-13 ENCOUNTER — Inpatient Hospital Stay: Payer: 59 | Admitting: Radiology

## 2022-03-13 ENCOUNTER — Inpatient Hospital Stay: Payer: 59

## 2022-03-13 ENCOUNTER — Inpatient Hospital Stay: Payer: 59 | Admitting: Hematology and Oncology

## 2022-03-13 ENCOUNTER — Encounter: Payer: Self-pay | Admitting: Radiology

## 2022-03-13 DIAGNOSIS — C50012 Malignant neoplasm of nipple and areola, left female breast: Secondary | ICD-10-CM

## 2022-03-13 DIAGNOSIS — Z170 Estrogen receptor positive status [ER+]: Secondary | ICD-10-CM

## 2022-03-13 LAB — PREGNANCY, URINE: Preg Test, Ur: NEGATIVE

## 2022-03-13 NOTE — Research (Signed)
[employer] 2021-05 - Specimen Collection Study to Evaluate Biomarkers in Subjects with Cancer  ? ? ?This Nurse has reviewed this patient's inclusion and exclusion criteria as a second review and confirms [name] is eligible for study participation.  Patient may continue with enrollment. ? ?Marjie Skiff. Khalea Ventura, RN, BSN, CHPN ?She  Her  Hers ?Clinical Research Nurse ?[place] ?Direct Dial [phone]  Pager [phone] ?03/13/2022 10:10 AM ?

## 2022-03-13 NOTE — Research (Deleted)
DCP-001: Use of a Clinical Trial Screening Tool to Address Cancer Health Disparities in the [facility])  ? ?03/13/2022 ? ? ?CONSENT: Patient [name] was identified by Dr. Lindi Adie as a potential candidate for the above listed study.  This Clinical Research Coordinator met with [name], [name], on 03/13/22 in a manner and location that ensures patient privacy to discuss participation in the above listed research study.  Patient is Unaccompanied.  A copy of the informed consent document and separate HIPAA Authorization was provided to the patient.  Patient reads, speaks, and understands [place].   ? ?Patient was provided with the business card of this Coordinator and encouraged to contact the research team with any questions.  Patient was provided the option of taking informed consent documents home to review and was encouraged to review at their convenience with their support network, including other care providers. Patient is comfortable with making a decision regarding study participation today. ? ?As outlined in the informed consent form, this Coordinator and [name] discussed the purpose of the research study, the investigational nature of the study, study procedures and requirements for study participation, potential risks and benefits of study participation, as well as alternatives to participation. The patient understands participation is voluntary and they may withdraw from study participation at any time.   ? ?Confidentiality and how the patient's information will be used as part of study participation were discussed.  Patient was informed there is not reimbursement provided for their time and effort spent on trial participation.  The patient is encouraged to discuss research study participation with their insurance provider to determine what costs they may incur as part of study participation, including research related injury.   ? ?All  questions were answered to patient's satisfaction.  The informed consent and separate HIPAA Authorization was reviewed page by page.  The patient's mental and emotional status is appropriate to provide informed consent, and the patient verbalizes an understanding of study participation.  Patient has agreed to participate in the above listed research study and has voluntarily signed the informed consent version dated 12/09/2021 and separate HIPAA Authorization, version valid until 06/29/2022  on 03/13/22 at 8:37AM.  The patient was provided with a copy of the signed informed consent form and separate HIPAA Authorization for their reference.  No study specific procedures were obtained prior to the signing of the informed consent document.  Approximately 10 minutes were spent with the patient reviewing the informed consent documents.  Patient was thanked for her time and participation in the above mentioned study.  ? ?After consent was obtained, patient was given DCP-001 Worksheet to complete. ? ?Carol Ada, RT(R)(T) ?Clinical Research Coordinator  ?

## 2022-03-13 NOTE — Research (Signed)
[id number] - TREATMENT OF REFRACTORY NAUSEA  ? ?03/13/2022 ? ? ?CONSENT:  ?This Coordinator has reviewed this patient's inclusion and exclusion criteria and confirmed DARCY BARBARA is eligible for study participation.  Patient will continue with enrollment. ? ?Menopausal status (women only): MARYORI WEIDE is pre-menopausal, has an IUD, and a pregnancy test was obtained today, 03/13/2022.  ? ?Eligibility confirmed by research RN and treating investigator, who also agrees that patient should proceed with enrollment.  ? ? ?Patient MARSHELLA TELLO was identified by Dr. Lindi Adie as a potential candidate for the above listed study.  This Clinical Research Coordinator met with ADRYANA MOGENSEN, [id number] on 03/13/22 in a manner and location that ensures patient privacy to discuss participation in the above listed research study.  Patient is Unaccompanied.  Patient was previously provided with informed consent documents.  Patient confirmed they have read the informed consent documents. ? ?As outlined in the informed consent form, this Coordinator and AEMILIA DEDRICK discussed the purpose of the research study, the investigational nature of the study, study procedures and requirements for study participation, potential risks and benefits of study participation, as well as alternatives to participation.  This study is blinded or double-blinded. The patient understands participation is voluntary and they may withdraw from study participation at any time.  Each study arm was reviewed, and randomization discussed.  This study does not involve an investigational drug or device. The chance of receiving placebo was discussed. Patient understands enrollment is pending full eligibility review.  ? ?Confidentiality and how the patient's information will be used as part of study participation were discussed.  Patient was informed there is not reimbursement provided for their time and effort spent on trial participation.  The  patient is encouraged to discuss research study participation with their insurance provider to determine what costs they may incur as part of study participation, including research related injury.   ? ?All questions were answered to patient's satisfaction.  The informed consent and separate HIPAA Authorization was reviewed page by page.  The patient's mental and emotional status is appropriate to provide informed consent, and the patient verbalizes an understanding of study participation.  Patient has agreed to participate in the above listed research study and has voluntarily signed the informed consent version dated 11/13/2021 and separate HIPAA Authorization, version valid until 01/01/2023  on 03/13/22 at 8:31AM.  The patient was provided with a copy of the signed informed consent form and separate HIPAA Authorization for their reference.  No study specific procedures were obtained prior to the signing of the informed consent document.  Approximately 30 minutes were spent with the patient reviewing the informed consent documents.  Release of information will be signed at next visit. Patient was thanked for her time and participation in the above mentioned study. Baseline blood collection will be obtained prior to first treatment. Patient was given backup questionnaires, but agreed to receive questionnaires via e-mail. The coordinator verified e-mail address on file.  ? ?After obtaining consent, patient was escorted to the lab where a pregnancy test was obtained.  ? ?Carol Ada, RT(R)(T) ?Clinical Research Coordinator  ?

## 2022-03-13 NOTE — Assessment & Plan Note (Addendum)
03/04/2022 History of subpectoral implants, palpable left breast mass by ultrasound 1.4 cm.  Multiple additional masses 1.1 and 0.9 cm.  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  Breast MRI showed at least 7 different masses largest 1.6 cm.  No lymphadenopathy ? ?Treatment plan: ?1. Neoadjuvant chemotherapy with TCH  ?6 cycles followed by Herceptin maintenance versus Kadcyla maintenance (based on response to neoadjuvant chemo) for 1 year ?2. Followed by mastectomy with sentinel lymph node study ?3. Followed by adjuvant antiestrogen therapy ?-------------------------------------------------------------------------------------------------------------------------------------------- ?Current treatment: Cycle 1 TCH ?Chemo consent obtained, chemo education completed, antiemetics were reviewed ?Echo 03/12/2022: EF 58% ? ?Return to clinic in 1 week for toxicity check ?

## 2022-03-13 NOTE — Research (Signed)
[employer] 2021-05 - Specimen Collection Study to Evaluate Biomarkers in Subjects with Cancer   ? ?03/13/2022 ? ?ELIGIBILITY/CONSENT VISIT:  ?This Coordinator has reviewed this patient's inclusion and exclusion criteria and confirmed [name] is eligible for study participation.  Patient will continue with enrollment. ? ?Eligibility confirmed by research RN and treating investigator, who also agrees that patient should proceed with enrollment.  ? ? ?Patient [name] was identified by Dr. Lindi Adie as a potential candidate for the above listed study.  This Clinical Research Coordinator met with [name], [name] on 03/13/22 in a manner and location that ensures patient privacy to discuss participation in the above listed research study.  Patient is Unaccompanied.  Patient was previously provided with informed consent documents.  Patient confirmed they have read the informed consent documents. ? ?As outlined in the informed consent form, this Coordinator and [name] discussed the purpose of the research study, the investigational nature of the study, study procedures and requirements for study participation, potential risks and benefits of study participation, as well as alternatives to participation.  This study is not blinded or double-blinded. The patient understands participation is voluntary and they may withdraw from study participation at any time.  This study does not involve randomization.  This study does not involve an investigational drug or device. This study does not involve a placebo. Patient understands enrollment is pending full eligibility review.  ? ?Confidentiality and how the patient's information will be used as part of study participation were discussed.  Patient was informed there is reimbursement provided for their time and effort spent on trial participation.  The patient is encouraged to discuss research study participation with their insurance provider  to determine what costs they may incur as part of study participation, including research related injury.   ? ?All questions were answered to patient's satisfaction.  The informed consent with embedded HIPAA language was reviewed page by page.  The patient's mental and emotional status is appropriate to provide informed consent, and the patient verbalizes an understanding of study participation.  Patient has agreed to participate in the above listed research study and has voluntarily signed the informed consent version dated 23 Nov 2020 with embedded HIPAA language, version dated 23 Nov 2020  on 03/13/22 at 8:21AM.  The patient was provided with a copy of the signed informed consent form with embedded HIPAA language for their reference.  No study specific procedures were obtained prior to the signing of the informed consent document.  Approximately 20 minutes were spent with the patient reviewing the informed consent documents.  Patient was not requested to complete a Release of Information form.  ? ?After consent was obtained, Patient History was obtained. Patient's responses are documented below: ? ? ? ?Medical History:  ?High Blood Pressure  No ?Coronary Artery Disease No ?Lupus    No ?Rheumatoid Arthritis  No ?Diabetes   No      ?Lynch Syndrome  No ? ?Is the patient currently taking a magnesium supplement?   No ? ? ?Does the patient have a personal history of cancer (greater than 5 years ago)?  No ? ? ?Does the patient have a family history of cancer in 1st or 2nd degree relatives? Yes ?If yes, Relationship(s) and Cancer type(s)?  ?Maternal grandmother+ Paternal aunt: breast ? ? ?Does the patient have history of alcohol consumption? Yes   ?If yes, current or former? Current ?Number of years? 21 ?Drinks per week? 6 ? ?Does the patient  have history of cigarette, cigar, pipe, or chewing tobacco use?  No  ? ? ?Patient was thanked for her time and participation in the above mentioned study. Patient will return for  her first treatment on 03/18/2022 and blood will be obtained prior to first treatment.  ? ?Carol Ada, RT(R)(T) ?Clinical Research Coordinator  ?

## 2022-03-13 NOTE — Research (Signed)
[id number] - TREATMENT OF REFRACTORY NAUSEA ? ? ?This Nurse has reviewed this patient's inclusion and exclusion criteria as a second review and confirms [name] is eligible for study participation.  Patient may continue with enrollment. ? ?Marjie Skiff. Caeson Filippi, RN, BSN, CHPN ?She  Her  Hers ?Clinical Research Nurse ?[place] ?Direct Dial [phone]  Pager [phone] ?03/13/2022 10:10 AM ?

## 2022-03-13 NOTE — Progress Notes (Signed)
? ?Patient Care Team: ?Donnajean Lopes, MD as PCP - General (Internal Medicine) ?Mauro Kaufmann, RN as Oncology Nurse Navigator ?Rockwell Germany, RN as Oncology Nurse Navigator ?Nicholas Lose, MD as Consulting Physician (Hematology and Oncology) ?Rolm Bookbinder, MD as Consulting Physician (General Surgery) ? ?DIAGNOSIS:  ?Encounter Diagnosis  ?Name Primary?  ? 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 ? 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 History of subpectoral implants, palpable left breast mass by ultrasound 1.4 cm.  Multiple additional masses 1.1 and 0.9 cm.  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  Breast MRI showed at least 7 different masses largest 1.6 cm.  No lymphadenopathy ?  ?03/18/2022 -  Chemotherapy  ? Patient is on Treatment Plan : BREAST Docetaxel + Carboplatin + Trastuzumab (TCH) q21d / Trastuzumab q21d  ? ?  ?  ? ? ?CHIEF COMPLIANT: Follow-up to discuss treatment plan ? ?INTERVAL HISTORY: [name] is a 41 year old with above-mentioned history of of breast cancer that is HER2 positive and she is going to start chemotherapy with Taxotere Herceptin and carboplatin starting next Tuesday.  She underwent chemo education today.  She is extremely emotional and is in tears today. ? ? ?ALLERGIES:  has No Known Allergies. ? ?MEDICATIONS:  ?Current Outpatient Medications  ?Medication Sig Dispense Refill  ? ALPRAZolam (XANAX) 0.25 MG tablet Take 1 tablet (0.25 mg total) by mouth at bedtime as needed for anxiety. 30 tablet 0  ? dexamethasone (DECADRON) 4 MG tablet Take 1 tablet (4 mg total) by mouth 2 (two) times daily. Take 1 tablet day before chemo and 1 tablet day after chemo with food 12 tablet 0  ? lidocaine-prilocaine  (EMLA) cream Apply to affected area once 30 g 3  ? ondansetron (ZOFRAN) 8 MG tablet Take 1 tablet (8 mg total) by mouth 2 (two) times daily as needed for refractory nausea / vomiting. Start on day 3 after chemo. 30 tablet 1  ? prochlorperazine (COMPAZINE) 10 MG tablet Take 1 tablet (10 mg total) by mouth every 6 (six) hours as needed (Nausea or vomiting). 30 tablet 1  ? ?No current facility-administered medications for this visit.  ? ? ?PHYSICAL EXAMINATION: ?ECOG PERFORMANCE STATUS: 1 - Symptomatic but completely ambulatory ? ?Vitals:  ? 03/13/22 1035  ?BP: 130/82  ?Pulse: 70  ?Resp: 18  ?Temp: 97.8 ?F (36.6 ?C)  ?SpO2: 98%  ? ?Filed Weights  ? 03/13/22 1035  ?Weight: 156 lb 12.8 oz (71.1 kg)  ?  ? ?LABORATORY DATA:  ?I have reviewed the data as listed ? ?  Latest Ref Rng & Units 12/12/2009  ?  7:31 AM 12/27/2007  ?  8:28 AM  ?CMP  ?Glucose 70 - 99 mg/dL 90   99    ?BUN 6 - 23 mg/dL 19   16    ?Creatinine 0.4 - 1.2 mg/dL 1.0   1.1    ?Sodium 135 - 145 meq/L 138   138    ?Potassium 3.5 - 5.1 meq/L 4.2   3.8    ?Chloride 96 - 112 meq/L 106   106    ?CO2 19 - 32 meq/L 27   24    ?Calcium 8.4 - 10.5 mg/dL 9.2   9.4    ?Total Protein  6.0 - 8.3 g/dL 6.9   6.7    ?Total Bilirubin 0.3 - 1.2 mg/dL 0.9   1.0    ?Alkaline Phos 39 - 117 units/L 42   35    ?AST 0 - 37 units/L 21   20    ?ALT 0 - 35 units/L 18   26    ? ? ?Lab Results  ?Component Value Date  ? WBC 7.9 11/26/2017  ? HGB 14.7 11/26/2017  ? HCT 42.6 11/26/2017  ? MCV 95.5 11/26/2017  ? PLT 235 11/26/2017  ? NEUTROABS 2.2 12/12/2009  ? ? 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  Multiple additional masses 1.1 and 0.9 cm.  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  Breast MRI showed at least 7 different masses largest 1.6 cm.  No  lymphadenopathy ? ?Treatment plan: ?1. Neoadjuvant chemotherapy with TCH  ?6 cycles followed by Herceptin maintenance versus Kadcyla maintenance (based on response to neoadjuvant chemo) for 1 year ?2. Followed by mastectomy with sentinel lymph node study ?3. Followed by adjuvant antiestrogen therapy ?-------------------------------------------------------------------------------------------------------------------------------------------- ?Current treatment: Cycle 1 TCH (on 03/18/2022) ?Chemo consent obtained, chemo education completed, antiemetics were reviewed ?Echo 03/12/2022: EF 58% ? ?Return to clinic in 1 week for toxicity check ? ? ?No orders of the defined types were placed in this encounter. ? ?The patient has a good understanding of the overall plan. she agrees with it. she will call with any problems that may develop before the next visit here. ?Total time spent: 30 mins including face to face time and time spent for planning, charting and co-ordination of care ? ? Harriette Ohara, MD ?03/13/22 ? ? ? ?

## 2022-03-13 NOTE — Research (Signed)
DCP-001: Use of a Clinical Trial Screening Tool to Address Cancer Health Disparities in the [facility])  ? ?03/13/2022 ? ? ?CONSENT: Patient [name] was identified by Dr. Lindi Adie as a potential candidate for the above listed study.  This Clinical Research Coordinator met with [name], [id number], on 03/13/22 in a manner and location that ensures patient privacy to discuss participation in the above listed research study.  Patient is Unaccompanied.  A copy of the informed consent document and separate HIPAA Authorization was provided to the patient.  Patient reads, speaks, and understands [place].   ? ?Patient was provided with the business card of this Coordinator and encouraged to contact the research team with any questions.  Patient was provided the option of taking informed consent documents home to review and was encouraged to review at their convenience with their support network, including other care providers. Patient is comfortable with making a decision regarding study participation today. ? ?As outlined in the informed consent form, this Coordinator and ANYSHA FRAPPIER discussed the purpose of the research study, the investigational nature of the study, study procedures and requirements for study participation, potential risks and benefits of study participation, as well as alternatives to participation. The patient understands participation is voluntary and they may withdraw from study participation at any time.   ? ?Confidentiality and how the patient's information will be used as part of study participation were discussed.  Patient was informed there is not reimbursement provided for their time and effort spent on trial participation.  The patient is encouraged to discuss research study participation with their insurance provider to determine what costs they may incur as part of study participation, including research related injury.   ? ?All  questions were answered to patient's satisfaction.  The informed consent and separate HIPAA Authorization was reviewed page by page.  The patient's mental and emotional status is appropriate to provide informed consent, and the patient verbalizes an understanding of study participation.  Patient has agreed to participate in the above listed research study and has voluntarily signed the informed consent version dated 12/09/2021 and separate HIPAA Authorization, version valid until 06/29/2022 on 03/13/22 at 8:37AM.  The patient was provided with a copy of the signed informed consent form and separate HIPAA Authorization for their reference.  No study specific procedures were obtained prior to the signing of the informed consent document.  Approximately 10 minutes were spent with the patient reviewing the informed consent documents.  Patient was thanked for her time and participation in the above mentioned study.  ? ?After consent was obtained, patient was given DCP-001 Worksheet to complete. ? ?Carol Ada, RT(R)(T) ?Clinical Research Coordinator  ?

## 2022-03-14 NOTE — Progress Notes (Signed)
The following biosimilar Ziextenzo (pegfilgrastim-bmez) has been selected for use in this patient. ? ?Kennith Center, Pharm.D., CPP ?03/14/2022'@11'$ :16 AM ? ? ?

## 2022-03-17 ENCOUNTER — Ambulatory Visit (HOSPITAL_COMMUNITY): Payer: 59

## 2022-03-17 ENCOUNTER — Inpatient Hospital Stay: Payer: 59 | Admitting: Hematology and Oncology

## 2022-03-17 ENCOUNTER — Encounter: Payer: Self-pay | Admitting: Genetic Counselor

## 2022-03-17 ENCOUNTER — Encounter (HOSPITAL_BASED_OUTPATIENT_CLINIC_OR_DEPARTMENT_OTHER)
Admission: RE | Disposition: A | Discharge status: Home / Self Care | Payer: Self-pay | Source: Home / Self Care | Attending: General Surgery

## 2022-03-17 ENCOUNTER — Other Ambulatory Visit: Payer: Self-pay

## 2022-03-17 ENCOUNTER — Encounter (HOSPITAL_BASED_OUTPATIENT_CLINIC_OR_DEPARTMENT_OTHER): Payer: Self-pay | Admitting: General Surgery

## 2022-03-17 ENCOUNTER — Ambulatory Visit (HOSPITAL_BASED_OUTPATIENT_CLINIC_OR_DEPARTMENT_OTHER): Payer: 59 | Admitting: Certified Registered"

## 2022-03-17 ENCOUNTER — Inpatient Hospital Stay: Payer: 59

## 2022-03-17 ENCOUNTER — Ambulatory Visit (HOSPITAL_BASED_OUTPATIENT_CLINIC_OR_DEPARTMENT_OTHER)
Admission: RE | Admit: 2022-03-17 | Discharge: 2022-03-17 | Disposition: A | Discharge status: Home / Self Care | Payer: 59 | Attending: General Surgery | Admitting: General Surgery

## 2022-03-17 DIAGNOSIS — Z170 Estrogen receptor positive status [ER+]: Secondary | ICD-10-CM | POA: Insufficient documentation

## 2022-03-17 DIAGNOSIS — C50212 Malignant neoplasm of upper-inner quadrant of left female breast: Secondary | ICD-10-CM | POA: Diagnosis present

## 2022-03-17 DIAGNOSIS — C50912 Malignant neoplasm of unspecified site of left female breast: Secondary | ICD-10-CM

## 2022-03-17 DIAGNOSIS — Z1379 Encounter for other screening for genetic and chromosomal anomalies: Secondary | ICD-10-CM | POA: Insufficient documentation

## 2022-03-17 HISTORY — PX: PORTACATH PLACEMENT: SHX2246

## 2022-03-17 HISTORY — DX: Anxiety disorder, unspecified: F41.9

## 2022-03-17 LAB — COMPREHENSIVE METABOLIC PANEL
ALT: 15 U/L (ref 0–44)
AST: 18 U/L (ref 15–41)
Albumin: 3.5 g/dL (ref 3.5–5.0)
Alkaline Phosphatase: 32 U/L — ABNORMAL LOW (ref 38–126)
Anion gap: 7 (ref 5–15)
BUN: 10 mg/dL (ref 6–20)
CO2: 25 mmol/L (ref 22–32)
Calcium: 8.6 mg/dL — ABNORMAL LOW (ref 8.9–10.3)
Chloride: 104 mmol/L (ref 98–111)
Creatinine, Ser: 0.81 mg/dL (ref 0.44–1.00)
GFR, Estimated: >60 mL/min (ref >60)
Glucose, Bld: 122 mg/dL — ABNORMAL HIGH (ref 70–99)
Potassium: 3.7 mmol/L (ref 3.5–5.1)
Sodium: 136 mmol/L (ref 135–145)
Total Bilirubin: 1.1 mg/dL (ref 0.3–1.2)
Total Protein: 5.7 g/dL — ABNORMAL LOW (ref 6.5–8.1)

## 2022-03-17 LAB — CBC WITH DIFFERENTIAL/PLATELET
Abs Immature Granulocytes: 0.02 10*3/uL (ref 0.00–0.07)
Basophils Absolute: 0.1 10*3/uL (ref 0.0–0.1)
Basophils Relative: 1 %
Eosinophils Absolute: 0 10*3/uL (ref 0.0–0.5)
Eosinophils Relative: 1 %
HCT: 35.2 % — ABNORMAL LOW (ref 36.0–46.0)
Hemoglobin: 12 g/dL (ref 12.0–15.0)
Immature Granulocytes: 0 %
Lymphocytes Relative: 35 %
Lymphs Abs: 2.1 10*3/uL (ref 0.7–4.0)
MCH: 32.6 pg (ref 26.0–34.0)
MCHC: 34.1 g/dL (ref 30.0–36.0)
MCV: 95.7 fL (ref 80.0–100.0)
Monocytes Absolute: 0.4 10*3/uL (ref 0.1–1.0)
Monocytes Relative: 6 %
Neutro Abs: 3.3 10*3/uL (ref 1.7–7.7)
Neutrophils Relative %: 57 %
Platelets: 172 10*3/uL (ref 150–400)
RBC: 3.68 MIL/uL — ABNORMAL LOW (ref 3.87–5.11)
RDW: 12.2 % (ref 11.5–15.5)
WBC: 5.8 10*3/uL (ref 4.0–10.5)
nRBC: 0 % (ref 0.0–0.2)

## 2022-03-17 LAB — POCT PREGNANCY, URINE: Preg Test, Ur: NEGATIVE

## 2022-03-17 IMAGING — RF DG C-ARM 1-60 MIN
1 series · 1 of 1 positions shown · IV contrast (agent unspecified)
Comparison: Chest x-ray [DATE].

CLINICAL DATA: Right Port-A-Cath insertion.

EXAM:
DG C-ARM 1-60 MIN
CONTRAST:  None.
FLUOROSCOPY:
Fluoroscopy Time:  0 minutes 21 seconds
Radiation Exposure Index (if provided by the fluoroscopic device):
2.09 mGy
Number of Acquired Spot Images: 1

[Series 1: run · 1 of 1 slices shown]
[im 1/1]
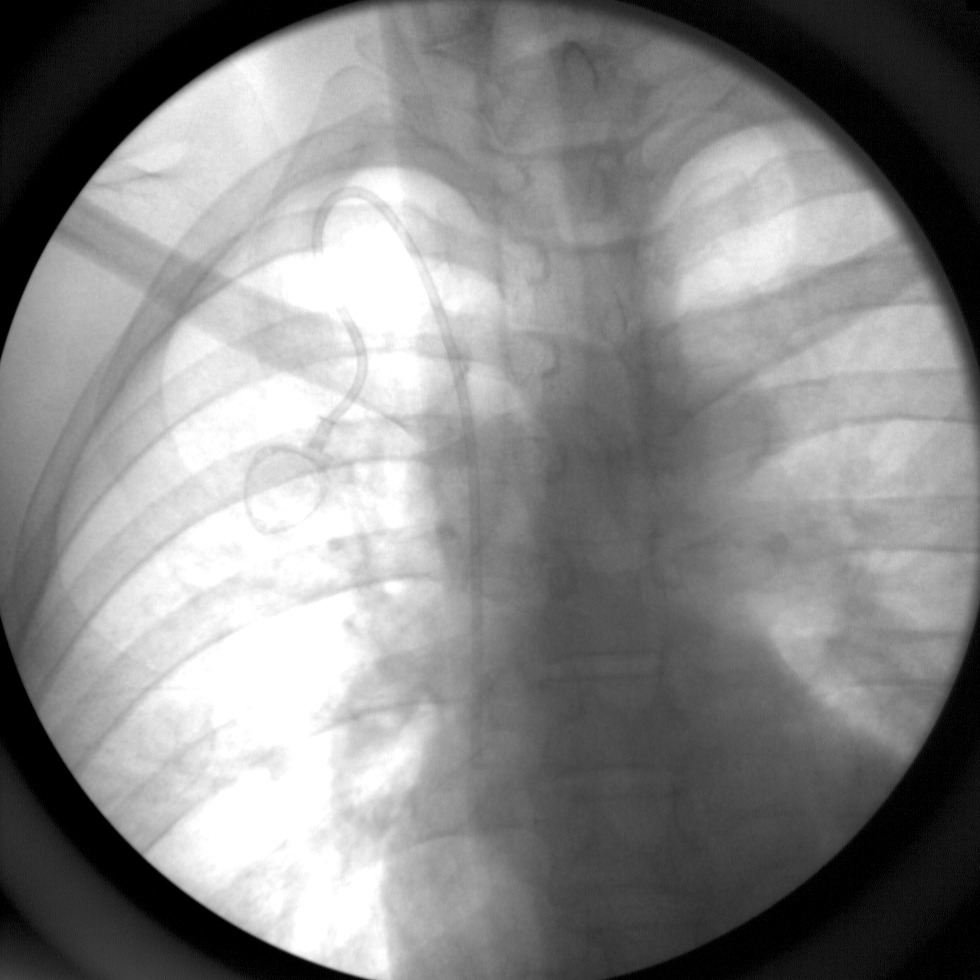

[1 of 1 positions shown; findings below may reference images not displayed]

FINDINGS: Port-A-Cath noted with its tip over the cavoatrial junction.
Catheter appears intact. No acute abnormality identified.
IMPRESSION: Port-A-Cath noted with tip over cavoatrial junction.

## 2022-03-17 SURGERY — INSERTION, TUNNELED CENTRAL VENOUS DEVICE, WITH PORT
Anesthesia: General | Site: Chest | Laterality: Right

## 2022-03-17 MED ORDER — ONDANSETRON HCL 4 MG/2ML IJ SOLN: 4.0000 mg | Freq: Once | INTRAMUSCULAR | Status: DC | PRN

## 2022-03-17 MED ORDER — HEPARIN (PORCINE) IN NACL 1000-0.9 UT/500ML-% IV SOLN
INTRAVENOUS | Status: AC
  Filled 2022-03-17: qty 500

## 2022-03-17 MED ORDER — FENTANYL CITRATE (PF) 100 MCG/2ML IJ SOLN: 25.0000 ug | INTRAMUSCULAR | Status: DC | PRN

## 2022-03-17 MED ORDER — ACETAMINOPHEN 500 MG PO TABS
1000.0000 mg | ORAL_TABLET | ORAL | Status: AC
  Administered 2022-03-17: 1000 mg via ORAL

## 2022-03-17 MED ORDER — ENSURE PRE-SURGERY PO LIQD: 296.0000 mL | Freq: Once | ORAL | Status: DC

## 2022-03-17 MED ORDER — TRAMADOL HCL 50 MG PO TABS: 50.0000 mg | ORAL_TABLET | Freq: Four times a day (QID) | ORAL | 0 refills | Status: DC | PRN

## 2022-03-17 MED ORDER — MIDAZOLAM HCL 2 MG/2ML IJ SOLN
INTRAMUSCULAR | Status: DC | PRN
Start: 2022-03-17 — End: 2022-03-17
  Administered 2022-03-17: 2 mg via INTRAVENOUS

## 2022-03-17 MED ORDER — PROPOFOL 10 MG/ML IV BOLUS
INTRAVENOUS | Status: AC
  Filled 2022-03-17: qty 20

## 2022-03-17 MED ORDER — CHLORHEXIDINE GLUCONATE CLOTH 2 % EX PADS: 6.0000 | MEDICATED_PAD | Freq: Once | CUTANEOUS | Status: DC

## 2022-03-17 MED ORDER — LACTATED RINGERS IV SOLN: INTRAVENOUS | Status: DC | PRN

## 2022-03-17 MED ORDER — ACETAMINOPHEN 500 MG PO TABS
ORAL_TABLET | ORAL | Status: AC
  Filled 2022-03-17: qty 2

## 2022-03-17 MED ORDER — ONDANSETRON HCL 4 MG/2ML IJ SOLN
INTRAMUSCULAR | Status: AC
  Filled 2022-03-17: qty 2

## 2022-03-17 MED ORDER — HEPARIN SOD (PORK) LOCK FLUSH 100 UNIT/ML IV SOLN
INTRAVENOUS | Status: DC | PRN
  Administered 2022-03-17: 500 [IU] via INTRAVENOUS

## 2022-03-17 MED ORDER — CEFAZOLIN SODIUM-DEXTROSE 2-4 GM/100ML-% IV SOLN
INTRAVENOUS | Status: AC
  Filled 2022-03-17: qty 100

## 2022-03-17 MED ORDER — OXYCODONE HCL 5 MG/5ML PO SOLN: 5.0000 mg | Freq: Once | ORAL | Status: DC | PRN

## 2022-03-17 MED ORDER — DEXAMETHASONE SODIUM PHOSPHATE 10 MG/ML IJ SOLN
INTRAMUSCULAR | Status: AC
  Filled 2022-03-17: qty 1

## 2022-03-17 MED ORDER — FENTANYL CITRATE (PF) 100 MCG/2ML IJ SOLN
INTRAMUSCULAR | Status: AC
  Filled 2022-03-17: qty 2

## 2022-03-17 MED ORDER — ONDANSETRON HCL 4 MG/2ML IJ SOLN
INTRAMUSCULAR | Status: DC | PRN
  Administered 2022-03-17: 4 mg via INTRAVENOUS

## 2022-03-17 MED ORDER — AMISULPRIDE (ANTIEMETIC) 5 MG/2ML IV SOLN: 10.0000 mg | Freq: Once | INTRAVENOUS | Status: DC | PRN

## 2022-03-17 MED ORDER — LIDOCAINE HCL (CARDIAC) PF 100 MG/5ML IV SOSY
PREFILLED_SYRINGE | INTRAVENOUS | Status: DC | PRN
Start: 2022-03-17 — End: 2022-03-17
  Administered 2022-03-17: 100 mg via INTRAVENOUS

## 2022-03-17 MED ORDER — CEFAZOLIN SODIUM-DEXTROSE 2-3 GM-%(50ML) IV SOLR
INTRAVENOUS | Status: DC | PRN
  Administered 2022-03-17: 2 g via INTRAVENOUS

## 2022-03-17 MED ORDER — CEFAZOLIN SODIUM-DEXTROSE 2-4 GM/100ML-% IV SOLN: 2.0000 g | INTRAVENOUS | Status: DC

## 2022-03-17 MED ORDER — HEPARIN (PORCINE) IN NACL 2-0.9 UNITS/ML
INTRAMUSCULAR | Status: AC | PRN
  Administered 2022-03-17: 1

## 2022-03-17 MED ORDER — BUPIVACAINE-EPINEPHRINE (PF) 0.25% -1:200000 IJ SOLN
INTRAMUSCULAR | Status: DC | PRN
  Administered 2022-03-17: 10 mL

## 2022-03-17 MED ORDER — DEXAMETHASONE SODIUM PHOSPHATE 10 MG/ML IJ SOLN
INTRAMUSCULAR | Status: DC | PRN
Start: 2022-03-17 — End: 2022-03-17
  Administered 2022-03-17: 5 mg via INTRAVENOUS

## 2022-03-17 MED ORDER — FENTANYL CITRATE (PF) 100 MCG/2ML IJ SOLN
INTRAMUSCULAR | Status: DC | PRN
  Administered 2022-03-17: 100 ug via INTRAVENOUS
  Administered 2022-03-17: 25 ug via INTRAVENOUS

## 2022-03-17 MED ORDER — BUPIVACAINE-EPINEPHRINE (PF) 0.25% -1:200000 IJ SOLN
INTRAMUSCULAR | Status: AC
  Filled 2022-03-17: qty 60

## 2022-03-17 MED ORDER — HEPARIN SOD (PORK) LOCK FLUSH 100 UNIT/ML IV SOLN
INTRAVENOUS | Status: AC
  Filled 2022-03-17: qty 5

## 2022-03-17 MED ORDER — PROPOFOL 10 MG/ML IV BOLUS
INTRAVENOUS | Status: DC | PRN
  Administered 2022-03-17: 200 mg via INTRAVENOUS

## 2022-03-17 MED ORDER — MIDAZOLAM HCL 2 MG/2ML IJ SOLN
INTRAMUSCULAR | Status: AC
  Filled 2022-03-17: qty 2

## 2022-03-17 MED ORDER — LACTATED RINGERS IV SOLN: INTRAVENOUS | Status: DC

## 2022-03-17 MED ORDER — OXYCODONE HCL 5 MG PO TABS: 5.0000 mg | ORAL_TABLET | Freq: Once | ORAL | Status: DC | PRN

## 2022-03-17 SURGICAL SUPPLY — 43 items
ADH SKN CLS APL DERMABOND .7 (GAUZE/BANDAGES/DRESSINGS) ×2
APL PRP STRL LF DISP 70% ISPRP (MISCELLANEOUS) ×1
BAG DECANTER FOR FLEXI CONT (MISCELLANEOUS) ×2 IMPLANT
BLADE SURG 11 STRL SS (BLADE) ×2 IMPLANT
BLADE SURG 15 STRL LF DISP TIS (BLADE) ×1 IMPLANT
BLADE SURG 15 STRL SS (BLADE) ×2
CHLORAPREP W/TINT 26 (MISCELLANEOUS) ×2 IMPLANT
COVER BACK TABLE 60X90IN (DRAPES) ×2 IMPLANT
COVER MAYO STAND STRL (DRAPES) ×2 IMPLANT
COVER PROBE 5X48 (MISCELLANEOUS) ×2
DERMABOND ADVANCED (GAUZE/BANDAGES/DRESSINGS) ×2
DERMABOND ADVANCED .7 DNX12 (GAUZE/BANDAGES/DRESSINGS) ×1 IMPLANT
DRAPE C-ARM 42X72 X-RAY (DRAPES) ×2 IMPLANT
DRAPE LAPAROSCOPIC ABDOMINAL (DRAPES) ×2 IMPLANT
DRAPE UTILITY XL STRL (DRAPES) ×2 IMPLANT
DRSG TEGADERM 4X4.75 (GAUZE/BANDAGES/DRESSINGS) ×2 IMPLANT
ELECT COATED BLADE 2.86 ST (ELECTRODE) ×2 IMPLANT
ELECT REM PT RETURN 9FT ADLT (ELECTROSURGICAL) ×2
ELECTRODE REM PT RTRN 9FT ADLT (ELECTROSURGICAL) ×1 IMPLANT
GAUZE 4X4 16PLY ~~LOC~~+RFID DBL (SPONGE) ×2 IMPLANT
GAUZE SPONGE 4X4 12PLY STRL LF (GAUZE/BANDAGES/DRESSINGS) ×3 IMPLANT
GLOVE BIO SURGEON STRL SZ7 (GLOVE) ×2 IMPLANT
GLOVE BIOGEL PI IND STRL 7.5 (GLOVE) ×1 IMPLANT
GLOVE BIOGEL PI INDICATOR 7.5 (GLOVE) ×1
GOWN STRL REUS W/ TWL LRG LVL3 (GOWN DISPOSABLE) ×2 IMPLANT
GOWN STRL REUS W/TWL LRG LVL3 (GOWN DISPOSABLE) ×4
KIT CVR 48X5XPRB PLUP LF (MISCELLANEOUS) IMPLANT
KIT PORT POWER 8FR ISP CVUE (Port) ×1 IMPLANT
NDL HYPO 25X1 1.5 SAFETY (NEEDLE) ×1 IMPLANT
NEEDLE HYPO 25X1 1.5 SAFETY (NEEDLE) ×2 IMPLANT
PACK BASIN DAY SURGERY FS (CUSTOM PROCEDURE TRAY) ×2 IMPLANT
PENCIL SMOKE EVACUATOR (MISCELLANEOUS) ×2 IMPLANT
SLEEVE SCD COMPRESS KNEE MED (STOCKING) ×2 IMPLANT
STRIP CLOSURE SKIN 1/2X4 (GAUZE/BANDAGES/DRESSINGS) ×3 IMPLANT
SUT MNCRL AB 4-0 PS2 18 (SUTURE) ×2 IMPLANT
SUT PROLENE 2 0 SH DA (SUTURE) ×2 IMPLANT
SUT VIC AB 3-0 SH 27 (SUTURE) ×2
SUT VIC AB 3-0 SH 27X BRD (SUTURE) ×1 IMPLANT
SYR 5ML LUER SLIP (SYRINGE) ×2 IMPLANT
SYR CONTROL 10ML LL (SYRINGE) ×2 IMPLANT
TOWEL GREEN STERILE FF (TOWEL DISPOSABLE) ×2 IMPLANT
TUBE CONNECTING 20X1/4 (TUBING) ×1 IMPLANT
YANKAUER SUCT BULB TIP NO VENT (SUCTIONS) ×1 IMPLANT

## 2022-03-17 NOTE — Anesthesia Postprocedure Evaluation (Signed)
Anesthesia Post Note ? ?Patient: KYMBERLY BLOMBERG ? ?Procedure(s) Performed: INSERTION PORT-A-CATH ? ?  ? ?Patient location during evaluation: PACU ?Anesthesia Type: General ?Level of consciousness: awake and alert ?Pain management: pain level controlled ?Vital Signs Assessment: post-procedure vital signs reviewed and stable ?Respiratory status: spontaneous breathing, nonlabored ventilation and respiratory function stable ?Cardiovascular status: blood pressure returned to baseline and stable ?Postop Assessment: no apparent nausea or vomiting ?Anesthetic complications: no ? ? ?No notable events documented. ? ?Last Vitals:  ?Vitals:  ? 03/17/22 1330 03/17/22 1355  ?BP: 122/82 118/83  ?Pulse: (!) 58 62  ?Resp: 16 20  ?Temp:  36.6 ?C  ?SpO2: 100% 100%  ?  ?Last Pain:  ?Vitals:  ? 03/17/22 1355  ?TempSrc:   ?PainSc: 0-No pain  ? ? ?  ?  ?  ?  ?  ?  ? ?Lidia Collum ? ? ? ? ?

## 2022-03-17 NOTE — Anesthesia Procedure Notes (Signed)
Procedure Name: LMA Insertion ?Date/Time: 03/17/2022 12:27 PM ?Performed by: Verita Lamb, CRNA ?Pre-anesthesia Checklist: Patient identified, Emergency Drugs available, Suction available and Patient being monitored ?Patient Re-evaluated:Patient Re-evaluated prior to induction ?Oxygen Delivery Method: Circle system utilized ?Preoxygenation: Pre-oxygenation with 100% oxygen ?Induction Type: IV induction ?Ventilation: Mask ventilation without difficulty ?LMA: LMA inserted ?LMA Size: 4.0 ?Number of attempts: 1 ?Airway Equipment and Method: Bite block ?Placement Confirmation: positive ETCO2 ?Tube secured with: Tape ?Dental Injury: Teeth and Oropharynx as per pre-operative assessment  ? ? ? ? ?

## 2022-03-17 NOTE — Anesthesia Preprocedure Evaluation (Addendum)
Anesthesia Evaluation  ?Patient identified by MRN, date of birth, ID band ?Patient awake ? ? ? ?Reviewed: ?Allergy & Precautions, NPO status , Patient's Chart, lab work & pertinent test results ? ?History of Anesthesia Complications ?Negative for: history of anesthetic complications ? ?Airway ?Mallampati: I ? ?TM Distance: >3 FB ?Neck ROM: Full ? ? ? Dental ? ?(+) Teeth Intact, Dental Advisory Given ?  ?Pulmonary ?neg pulmonary ROS,  ?  ?Pulmonary exam normal ? ? ? ? ? ? ? Cardiovascular ?negative cardio ROS ?Normal cardiovascular exam ? ? ?  ?Neuro/Psych ? Headaches, Anxiety   ? GI/Hepatic ?negative GI ROS, Neg liver ROS,   ?Endo/Other  ?negative endocrine ROS ? Renal/GU ?negative Renal ROS  ?negative genitourinary ?  ?Musculoskeletal ?negative musculoskeletal ROS ?(+)  ? Abdominal ?  ?Peds ? Hematology ?negative hematology ROS ?(+)   ?Anesthesia Other Findings ? ?Breast cancer ? Reproductive/Obstetrics ?negative OB ROS ? ?  ? ? ? ? ? ? ? ? ? ? ? ? ? ?  ?  ? ? ? ? ? ? ? ?Anesthesia Physical ?Anesthesia Plan ? ?ASA: 2 ? ?Anesthesia Plan: General  ? ?Post-op Pain Management: Tylenol PO (pre-op)* and Toradol IV (intra-op)*  ? ?Induction: Intravenous ? ?PONV Risk Score and Plan: 3 and Ondansetron, Dexamethasone, Midazolam and Treatment may vary due to age or medical condition ? ?Airway Management Planned: LMA ? ?Additional Equipment: None ? ?Intra-op Plan:  ? ?Post-operative Plan: Extubation in OR ? ?Informed Consent: I have reviewed the patients History and Physical, chart, labs and discussed the procedure including the risks, benefits and alternatives for the proposed anesthesia with the patient or authorized representative who has indicated his/her understanding and acceptance.  ? ? ? ?Dental advisory given ? ?Plan Discussed with:  ? ?Anesthesia Plan Comments:   ? ? ? ? ? ? ?Anesthesia Quick Evaluation ? ?

## 2022-03-17 NOTE — Discharge Instructions (Addendum)
? ? ?PORT-A-CATH: POST OP INSTRUCTIONS ? ?Always review your discharge instruction sheet given to you by the facility where your surgery was performed.  ? ?A prescription for pain medication may be given to you upon discharge. Take your pain medication as prescribed, if needed. If narcotic pain medicine is not needed, then you make take acetaminophen (Tylenol) or ibuprofen (Advil) as needed.  ?Take your usually prescribed medications unless otherwise directed. ?If you need a refill on your pain medication, please contact our office. All narcotic pain medicine now requires a paper prescription.  Phoned in and fax refills are no longer allowed by law.  Prescriptions will not be filled after 5 pm or on weekends.  ?You should follow a light diet for the remainder of the day after your procedure. ?Most patients will experience some mild swelling and/or bruising in the area of the incision. It may take several days to resolve. ?It is common to experience some constipation if taking pain medication after surgery. Increasing fluid intake and taking a stool softener (such as Colace) will usually help or prevent this problem from occurring. A mild laxative (Milk of Magnesia or Miralax) should be taken according to package directions if there are no bowel movements after 48 hours.  ?Unless discharge instructions indicate otherwise, you may remove your bandages 48 hours after surgery, and you may shower at that time. You may have steri-strips (small white skin tapes) in place directly over the incision.  These strips should be left on the skin for 7-10 days.  If your surgeon used Dermabond (skin glue) on the incision, you may shower in 24 hours.  The glue will flake off over the next 2-3 weeks.  ?If your port is left accessed at the end of surgery (needle left in port), the dressing cannot get wet and should only by changed by a healthcare professional. When the port is no longer accessed (when the needle has been removed),  follow step 7.   ?ACTIVITIES:  Limit activity involving your arms for the next 72 hours. Do no strenuous exercise or activity for 1 week. You may drive when you are no longer taking prescription pain medication, you can comfortably wear a seatbelt, and you can maneuver your car. ?10.You may need to see your doctor in the office for a follow-up appointment.  Please ?      check with your doctor.  ?11.When you receive a new Port-a-Cath, you will get a product guide and  ?      ID card.  Please keep them in case you need them. ? ?WHEN TO [facility] [id number]): ?Fever over 101.0 ?Chills ?Continued bleeding from incision ?Increased redness and tenderness at the site ?Shortness of breath, difficulty breathing ? ? ?The clinic staff is available to answer your questions during regular business hours. Please don?t hesitate to call and ask to speak to one of the nurses or medical assistants for clinical concerns. If you have a medical emergency, go to the nearest emergency room or call 911.  A surgeon from [facility] is always on call at the hospital.  ? ? ? ?For further information, please visit [email or web address] ? ? ?No Tylenol before 5:30pm if needed. ? ? ?Post Anesthesia Home Care Instructions ? ?Activity: ?Get plenty of rest for the remainder of the day. A responsible individual must stay with you for 24 hours following the procedure.  ?For the next 24 hours, DO NOT: ?-Drive a car ?-Operate machinery ?-Drink alcoholic beverages ?-Take  any medication unless instructed by your physician ?-Make any legal decisions or sign important papers. ? ?Meals: ?Start with liquid foods such as gelatin or soup. Progress to regular foods as tolerated. Avoid greasy, spicy, heavy foods. If nausea and/or vomiting occur, drink only clear liquids until the nausea and/or vomiting subsides. Call your physician if vomiting continues. ? ?Special Instructions/Symptoms: ?Your throat may feel dry or sore from the  anesthesia or the breathing tube placed in your throat during surgery. If this causes discomfort, gargle with warm salt water. The discomfort should disappear within 24 hours. ? ?If you had a scopolamine patch placed behind your ear for the management of post- operative nausea and/or vomiting: ? ?1. The medication in the patch is effective for 72 hours, after which it should be removed.  Wrap patch in a tissue and discard in the trash. Wash hands thoroughly with soap and water. ?2. You may remove the patch earlier than 72 hours if you experience unpleasant side effects which may include dry mouth, dizziness or visual disturbances. ?3. Avoid touching the patch. Wash your hands with soap and water after contact with the patch. ?    ? ? ?

## 2022-03-17 NOTE — H&P (Signed)
?41 year old female who has a history of subpectoral implants. She palpated a mass in her left breast a couple of months ago. This appeared to be large and it was not going away. She has C to D density breast. On mammography as well as ultrasound she had an irregular mass at the palpable site with an additional irregular mass in the upper outer left breast. Targeted ultrasound showed at the palpable site at 930 a 1.4 x 1.2 x 1.3 cm mass. There are additional similar smaller appearing subcentimeter masses right around there as well as an additional 1.1 cm mass and a 0.9 cm mass. This is basically throughout the upper inner left breast. Targeted ultrasound of the axilla is normal. She underwent a biopsy of this dominant mass. 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 This is 95% ER positive, 100% PR positive, HER2 is positive and Ki-67 is 10%. She is here today. She has no discharge. She is with her parents to discuss all of her options. She has a family history in a paternal aunt with breast cancer taking care of before as well as a maternal grandmother with breast and ovarian cancer. ?after her visit mri results came back and this shows a total of 7 masses in medial left breast measuring up to 1.6 cm. No adenopathy and right breast is negative. ? ?Review of Systems: ?A complete review of systems was obtained from the patient. I have reviewed this information and discussed as appropriate with the patient. See HPI as well for other ROS. ? ?Review of Systems  ?All other systems reviewed and are negative. ? ? ?Medical History: ?Past Medical History:  ?Diagnosis Date  ? Anxiety  ? History of cancer  ? ?There is no problem list on file for this patient. ? ?Past Surgical History:  ?Procedure Laterality Date  ? breast augmentation  ?2014  ? ? ?No Known Allergies ? ?Current Outpatient Medications on File Prior to Visit  ?Medication Sig Dispense Refill  ? ALPRAZolam (XANAX) 0.25 MG tablet   ? ? ?Family History  ?Problem Relation Age of Onset  ? High blood pressure (Hypertension) Father  ? Hyperlipidemia (Elevated cholesterol) Father  ? Diabetes Father  ? ? ?Social History  ? ?Tobacco Use  ?Smoking Status Never  ?Smokeless Tobacco Never  ? ? ?Social History  ? ?Socioeconomic History  ? Marital status: Single  ?Tobacco Use  ? Smoking status: Never  ? Smokeless tobacco: Never  ?Substance and Sexual Activity  ? Alcohol use: Yes  ? Drug use: Not Currently  ? ?Objective:  ? ?Vitals:  ?03/07/22 1225  ?BP: 128/82  ?Pulse: (!) 126  ?Weight: 71.7 kg (158 lb)  ?Height: 172.7 cm (5' 8")  ? ?Body mass index is 24.02 kg/m?. ? ?Physical Exam ?Vitals reviewed.  ?Constitutional:  ?Appearance: Normal appearance.  ?Chest:  ?Breasts: ?Right: No inverted nipple, mass or nipple discharge.  ?Left: Mass present. No inverted nipple or nipple discharge.  ?Comments: Multiple left upper inner and medial left breast masses with associated hematoma after biopsy ?Lymphadenopathy:  ?Upper Body:  ?Right upper body: No supraclavicular or axillary adenopathy.  ?Left upper body: No supraclavicular or axillary adenopathy.  ?Neurological:  ?Mental Status: She is alert.  ? ?Assessment and Plan:  ? ?Malignant neoplasm of upper-inner quadrant of left breast in female, estrogen receptor positive (CMS-HCC) ? ?Oncology appt, plan for primary systemic therapy with port placement, surgery following and I think will still require mastectomy (an  nsm would be possible) with sn biopsy. Discussed bilateral procedure as well. ? ?We discussed the staging and pathophysiology of breast cancer. We discussed all of the different options for treatment for breast cancer including surgery, chemotherapy, radiation therapy, Herceptin, and antiestrogen therapy. ? ?With this amount of her 2 pos tumor in medial breast I think would be helpful to facilitate eventual clear margins and to assess for response to begin with primary systemic therapy. I discussed the  rationale for this with her at length. I discussed port placement on the right side with her today. We discussed surgery would be about a month after she finished primary systemic therapy. I would have her see plastic surgery before then as well. We discussed genetic testing today as well. I did send off salivary genetic testing through [facility]. I discussed the health insurance and life insurance issues. We discussed the possible results including positive, negative, as well as a VUS. I am going to have her see oncology Monday we will plan on a port placement as soon as possible to begin primary therapy. ?

## 2022-03-17 NOTE — Interval H&P Note (Signed)
History and Physical Interval Note: ? ?03/17/2022 ?11:28 AM ? ?[name]  has presented today for surgery, with the diagnosis of BREAST CANCER.  The various methods of treatment have been discussed with the patient and family. After consideration of risks, benefits and other options for treatment, the patient has consented to  Procedure(s): ?INSERTION PORT-A-CATH (N/A) as a surgical intervention.  The patient's history has been reviewed, patient examined, no change in status, stable for surgery.  I have reviewed the patient's chart and labs.  Questions were answered to the patient's satisfaction.   ? ? ?Rolm Bookbinder ? ? ?

## 2022-03-17 NOTE — Op Note (Signed)
Preoperative diagnosis: left breast cancer ?Postoperative diagnosis: Same as above ?Procedure: Right internal jugular port placement with ultrasound guidance ?Surgeon: Dr. Serita Grammes ?Anesthesia: General ?Estimated blood loss: Minimal ?Complications: None ?Drains: None ?Special count was correct at completion ?Disposition recovery stable condition ? ?Indications: 41 year old female who has a history of subpectoral implants. She palpated a mass in her left breast a couple of months ago. This appeared to be large and it was not going away. She has C to D density breast. On mammography as well as ultrasound she had an irregular mass at the palpable site with an additional irregular mass in the upper outer left breast. Targeted ultrasound showed at the palpable site at 930 a 1.4 x 1.2 x 1.3 cm mass. There are additional similar smaller appearing subcentimeter masses right around there as well as an additional 1.1 cm mass and a 0.9 cm mass. This is basically throughout the upper inner left breast. Targeted ultrasound of the axilla is normal. She underwent a biopsy of this dominant mass. 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 This is 95% ER positive, 100% PR positive, HER2 is positive and Ki-67 is 10%. after her visit mri results came back and this shows a total of 7 masses in medial left breast measuring up to 1.6 cm. No adenopathy and right breast is negative. ? ?Procedure: After informed consent was obtained the patient was taken to the operating room.  She was given antibiotics.  SCDs were in place.  She was placed under general anesthesia with an LMA.  She was then prepped and draped in the standard sterile surgical fashion.  Surgical timeout was then performed. ? ?I identified the right IJ with the ultrasound.  I made a small nick in the skin.  I accessed the needle under ultrasound guidance.  The wire was placed.  The wire was in good position on the x-ray.  The  ultrasound also showed the wire to be in the vein.  I then infiltrated Marcaine and lidocaine below the clavicle.  I made incision and developed a pocket for the port.  I placed a Environmental manager between those and brought the line through both of those.  I then placed the dilator under fluoroscopic vision over the wire.  This was in good position.  The wire and the dilator were then removed leaving the sheath in place.  The line was placed in the sheath.  The sheath was then removed.  I pulled the line back to be in the distal SVC.  This was all in good position. I then connected the port and placed this in the pocket.  I sutured this into position with a 2-0 Prolene suture.  This flushed easily and and aspirated blood.  I then did 1 final x-ray.  I closed this with 3-0 Vicryl and 4-0 Monocryl.  A Steri-Strip was placed over the incision to protect it.  I accessed the port for chemotherapy tomorrow and placed a dressing. It aspirated blood and flushed easily. I packed it with heparin. She tolerated this well was extubated and transferred to recovery stable.  ?  ?  ?  ? ?

## 2022-03-17 NOTE — Transfer of Care (Signed)
Immediate Anesthesia Transfer of Care Note ? ?Patient: [name] ? ?Procedure(s) Performed: INSERTION PORT-A-CATH ? ?Patient Location: PACU ? ?Anesthesia Type:General ? ?Level of Consciousness: awake, alert  and oriented ? ?Airway & Oxygen Therapy: Patient Spontanous Breathing and Patient connected to face mask oxygen ? ?Post-op Assessment: Report given to RN and Post -op Vital signs reviewed and stable ? ?Post vital signs: Reviewed and stable ? ?Last Vitals:  ?Vitals Value Taken Time  ?BP    ?Temp    ?Pulse    ?Resp    ?SpO2    ? ? ?Last Pain:  ?Vitals:  ? 03/17/22 1115  ?TempSrc: Oral  ?PainSc: 0-No pain  ?   ? ?  ? ?Complications: No notable events documented. ?

## 2022-03-18 ENCOUNTER — Encounter (HOSPITAL_BASED_OUTPATIENT_CLINIC_OR_DEPARTMENT_OTHER): Payer: Self-pay | Admitting: General Surgery

## 2022-03-18 ENCOUNTER — Encounter: Payer: Self-pay | Admitting: *Deleted

## 2022-03-18 ENCOUNTER — Inpatient Hospital Stay: Payer: 59

## 2022-03-18 VITALS — BP 112/65 | HR 57 | Temp 98.6°F | Resp 16 | Ht 68.0 in | Wt 160.5 lb

## 2022-03-18 DIAGNOSIS — Z170 Estrogen receptor positive status [ER+]: Secondary | ICD-10-CM

## 2022-03-18 DIAGNOSIS — C50012 Malignant neoplasm of nipple and areola, left female breast: Secondary | ICD-10-CM | POA: Diagnosis not present

## 2022-03-18 LAB — RESEARCH LABS

## 2022-03-18 MED ORDER — PALONOSETRON HCL INJECTION 0.25 MG/5ML
0.2500 mg | Freq: Once | INTRAVENOUS | Status: AC
  Administered 2022-03-18: 0.25 mg via INTRAVENOUS
  Filled 2022-03-18: qty 5

## 2022-03-18 MED ORDER — SODIUM CHLORIDE 0.9 % IV SOLN: Freq: Once | INTRAVENOUS | Status: AC

## 2022-03-18 MED ORDER — SODIUM CHLORIDE 0.9% FLUSH
10.0000 mL | INTRAVENOUS | Status: DC | PRN
  Administered 2022-03-18: 10 mL

## 2022-03-18 MED ORDER — SODIUM CHLORIDE 0.9 % IV SOLN
600.0000 mg | Freq: Once | INTRAVENOUS | Status: AC
  Administered 2022-03-18: 600 mg via INTRAVENOUS
  Filled 2022-03-18: qty 60

## 2022-03-18 MED ORDER — TRASTUZUMAB-DKST CHEMO 150 MG IV SOLR
8.0000 mg/kg | Freq: Once | INTRAVENOUS | Status: AC
  Administered 2022-03-18: 588 mg via INTRAVENOUS
  Filled 2022-03-18: qty 28

## 2022-03-18 MED ORDER — HEPARIN SOD (PORK) LOCK FLUSH 100 UNIT/ML IV SOLN
500.0000 [IU] | Freq: Once | INTRAVENOUS | Status: AC | PRN
  Administered 2022-03-18: 500 [IU]

## 2022-03-18 MED ORDER — SODIUM CHLORIDE 0.9 % IV SOLN
150.0000 mg | Freq: Once | INTRAVENOUS | Status: AC
  Administered 2022-03-18: 150 mg via INTRAVENOUS
  Filled 2022-03-18: qty 150

## 2022-03-18 MED ORDER — DIPHENHYDRAMINE HCL 50 MG/ML IJ SOLN
25.0000 mg | Freq: Once | INTRAMUSCULAR | Status: AC
  Administered 2022-03-18: 25 mg via INTRAVENOUS
  Filled 2022-03-18: qty 1

## 2022-03-18 MED ORDER — SODIUM CHLORIDE 0.9 % IV SOLN
10.0000 mg | Freq: Once | INTRAVENOUS | Status: AC
  Administered 2022-03-18: 10 mg via INTRAVENOUS
  Filled 2022-03-18: qty 10

## 2022-03-18 MED ORDER — SODIUM CHLORIDE 0.9 % IV SOLN
75.0000 mg/m2 | Freq: Once | INTRAVENOUS | Status: AC
  Administered 2022-03-18: 140 mg via INTRAVENOUS
  Filled 2022-03-18: qty 14

## 2022-03-18 MED ORDER — ACETAMINOPHEN 325 MG PO TABS
650.0000 mg | ORAL_TABLET | Freq: Once | ORAL | Status: AC
  Administered 2022-03-18: 650 mg via ORAL
  Filled 2022-03-18: qty 2

## 2022-03-18 NOTE — Progress Notes (Signed)
Per Dr. Lindi Adie, ok for treatment today with xray of port placement. ?

## 2022-03-18 NOTE — Patient Instructions (Addendum)
[name]  Discharge Instructions: ?Thank you for choosing [facility] to provide your oncology and hematology care.  ? ?If you have a lab appointment with the [facility], please go directly to the [facility] and check in at the registration area. ?  ?Wear comfortable clothing and clothing appropriate for easy access to any Portacath or PICC line.  ? ?We strive to give you quality time with your provider. You may need to reschedule your appointment if you arrive late (15 or more minutes).  Arriving late affects you and other patients whose appointments are after yours.  Also, if you miss three or more appointments without notifying the office, you may be dismissed from the clinic at the provider?s discretion.    ?  ?For prescription refill requests, have your pharmacy contact our office and allow 72 hours for refills to be completed.   ? ?Today you received the following chemotherapy and/or immunotherapy agents:  Trastuzumab, Docetaxel (Taxotere), and Carboplatin. ?  ?To help prevent nausea and vomiting after your treatment, we encourage you to take your nausea medication as directed. ? ?BELOW ARE SYMPTOMS THAT SHOULD BE REPORTED IMMEDIATELY: ?*FEVER GREATER THAN 100.4 F (38 ?C) OR HIGHER ?*CHILLS OR SWEATING ?*NAUSEA AND VOMITING THAT IS NOT CONTROLLED WITH YOUR NAUSEA MEDICATION ?*UNUSUAL SHORTNESS OF BREATH ?*UNUSUAL BRUISING OR BLEEDING ?*URINARY PROBLEMS (pain or burning when urinating, or frequent urination) ?*BOWEL PROBLEMS (unusual diarrhea, constipation, pain near the anus) ?TENDERNESS IN MOUTH AND THROAT WITH OR WITHOUT PRESENCE OF ULCERS (sore throat, sores in mouth, or a toothache) ?UNUSUAL RASH, SWELLING OR PAIN  ?UNUSUAL VAGINAL DISCHARGE OR ITCHING  ? ?Items with * indicate a potential emergency and should be followed up as soon as possible or go to the Emergency Department if any problems should occur. ? ?Please show the CHEMOTHERAPY ALERT CARD or  IMMUNOTHERAPY ALERT CARD at check-in to the Emergency Department and triage nurse. ? ?Should you have questions after your visit or need to cancel or reschedule your appointment, please contact [facility]  Dept: [phone]  and follow the prompts.  Office hours are 8:00 a.m. to 4:30 p.m. Monday - Friday. Please note that voicemails left after 4:00 p.m. may not be returned until the following business day.  We are closed weekends and major holidays. You have access to a nurse at all times for urgent questions. Please call the main number to the clinic Dept: [phone] and follow the prompts. ? ? ?For any non-urgent questions, you may also contact your provider using MyChart. We now offer e-Visits for anyone 44 and older to request care online for non-urgent symptoms. For details visit [email or web address]. ?  ?Also download the MyChart app! Go to the app store, search "MyChart", open the app, select [facility], and log in with your MyChart username and password. ? ?Due to Covid, a mask is required upon entering the hospital/clinic. If you do not have a mask, one will be given to you upon arrival. For doctor visits, patients may have 1 support person aged 8 or older with them. For treatment visits, patients cannot have anyone with them due to current Covid guidelines and our immunocompromised population.  ? ?Trastuzumab injection for infusion ?What is this medication? ?TRASTUZUMAB (tras TOO zoo mab) is a monoclonal antibody. It is used to treat breast cancer and stomach cancer. ?This medicine may be used for other purposes; ask your health care provider or pharmacist if you have questions. ?COMMON  BRAND NAME(S): Herceptin, [name], Ogivri, Ontruzant, Trazimera ?What should I tell my care team before I take this medication? ?They need to know if you have any of these conditions: ?heart disease ?heart failure ?lung or breathing disease, like asthma ?an unusual or  allergic reaction to trastuzumab, benzyl alcohol, or other medications, foods, dyes, or preservatives ?pregnant or trying to get pregnant ?breast-feeding ?How should I use this medication? ?This drug is given as an infusion into a vein. It is administered in a hospital or clinic by a specially trained health care professional. ?Talk to your pediatrician regarding the use of this medicine in children. This medicine is not approved for use in children. ?Overdosage: If you think you have taken too much of this medicine contact a poison control center or emergency room at once. ?NOTE: This medicine is only for you. Do not share this medicine with others. ?What if I miss a dose? ?It is important not to miss a dose. Call your doctor or health care professional if you are unable to keep an appointment. ?What may interact with this medication? ?This medicine may interact with the following medications: ?certain types of chemotherapy, such as daunorubicin, doxorubicin, epirubicin, and idarubicin ?This list may not describe all possible interactions. Give your health care provider a list of all the medicines, herbs, non-prescription drugs, or dietary supplements you use. Also tell them if you smoke, drink alcohol, or use illegal drugs. Some items may interact with your medicine. ?What should I watch for while using this medication? ?Visit your doctor for checks on your progress. Report any side effects. Continue your course of treatment even though you feel ill unless your doctor tells you to stop. ?Call your doctor or health care professional for advice if you get a fever, chills or sore throat, or other symptoms of a cold or flu. Do not treat yourself. Try to avoid being around people who are sick. ?You may experience fever, chills and shaking during your first infusion. These effects are usually mild and can be treated with other medicines. Report any side effects during the infusion to your health care professional. Fever  and chills usually do not happen with later infusions. ?Do not become pregnant while taking this medicine or for 7 months after stopping it. Women should inform their doctor if they wish to become pregnant or think they might be pregnant. Women of child-bearing potential will need to have a negative pregnancy test before starting this medicine. There is a potential for serious side effects to an unborn child. Talk to your health care professional or pharmacist for more information. Do not breast-feed an infant while taking this medicine or for 7 months after stopping it. ?Women must use effective birth control with this medicine. ?What side effects may I notice from receiving this medication? ?Side effects that you should report to your doctor or health care professional as soon as possible: ?allergic reactions like skin rash, itching or hives, swelling of the face, lips, or tongue ?chest pain or palpitations ?cough ?dizziness ?feeling faint or lightheaded, falls ?fever ?general ill feeling or flu-like symptoms ?signs of worsening heart failure like breathing problems; swelling in your legs and feet ?unusually weak or tired ?Side effects that usually do not require medical attention (report to your doctor or health care professional if they continue or are bothersome): ?bone pain ?changes in taste ?diarrhea ?joint pain ?nausea/vomiting ?weight loss ?This list may not describe all possible side effects. Call your doctor for medical advice about  side effects. You may report side effects to FDA at 1-800-FDA-1088. ?Where should I keep my medication? ?This drug is given in a hospital or clinic and will not be stored at home. ?NOTE: This sheet is a summary. It may not cover all possible information. If you have questions about this medicine, talk to your doctor, pharmacist, or health care provider. ?? 2023 Elsevier/Gold Standard (2016-11-11 00:00:00) ? ?Docetaxel injection ?What is this medication? ?DOCETAXEL (doe se TAX  el) is a chemotherapy drug. It targets fast dividing cells, like cancer cells, and causes these cells to die. This medicine is used to treat many types of cancers like breast cancer, certain stomach cancers

## 2022-03-18 NOTE — Research (Signed)
[employer] 2021-05 - Specimen Collection Study to Evaluate Biomarkers in Subjects with Cancer  ? ?Patient previously consented to above-listed study and provided pertinent medical history on 03/13/22. She presented today for her first cycle of chemotherapy. Research saw patient and collected blood specimens prior to any treatment procedures.  ? ?LABS: Research blood obtained via port-a-cath, per patient's preference. Patient tolerated well without any adverse events. ? ?GIFT CARD: $50 gift card given to patient for her participation in this study.  ? ?Patient thanked for her time and voluntary participation in this study. Patient provided with direct contact information and encouraged to contact research for any questions or concerns. ? ?Vickii Penna, RN, BSN, CPN ?Clinical Research Nurse I ?[phone] ? ?03/18/2022 8:44 AM ? ? ?
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
## 2022-03-18 NOTE — Research (Signed)
[id number] - TREATMENT OF REFRACTORY NAUSEA ? ?Patient previously consented to above-listed study on 03/13/22. Patient presented to clinic today unaccompanied for her cycle 1 day 1 appointments. ?  ?LABS: Research labs collected prior to treatment via port-a cath per protocol and patient preference. Patient tolerated well without complaint.  ?  ?PROs: Confirmed that patient received reminders to fill out her baseline PROs online and had completed them successfully. Provided patient with the 4 day home record and reviewed instructions for completion. Also provided her with back-up paper PROs in case she had problems with the online forms for cycle 1. Patient verbalized understanding and denied further questions at this time. ?  ?PLAN: Patient agreed to C1D4 call on Friday, 5/12. Patient to return home record at toxicity check on 5/16; research will determine eligibility for proceeding to cycle 2. Patient thanked for her time and continued voluntary participation in this study. Patient has been provided direct contact information and is encouraged to contact this nurse for any questions or concerns. In addition, patient completed a Release of Information form this morning. ? ?Vickii Penna, RN, BSN, CPN ?Clinical Research Nurse I ?[phone] ? ?03/18/2022 8:49 AM ? ?

## 2022-03-19 ENCOUNTER — Telehealth: Payer: Self-pay

## 2022-03-19 ENCOUNTER — Ambulatory Visit: Payer: 59 | Admitting: Internal Medicine

## 2022-03-19 VITALS — BP 124/76 | HR 66 | Ht 68.0 in | Wt 160.0 lb

## 2022-03-19 DIAGNOSIS — I427 Cardiomyopathy due to drug and external agent: Secondary | ICD-10-CM | POA: Diagnosis not present

## 2022-03-19 NOTE — Patient Instructions (Signed)
Medication Instructions:  ?Your Physician recommend you continue on your current medication as directed.   ? ?*If you need a refill on your cardiac medications before your next appointment, please call your pharmacy* ? ? ?Testing/Procedures: ?Your physician has requested that you have an echocardiogram. Echocardiography is a painless test that uses sound waves to create images of your heart. It provides your doctor with information about the size and shape of your heart and how well your heart's chambers and valves are working. This procedure takes approximately one hour. There are no restrictions for this procedure. ? ? ?Follow-Up: ?At [facility], you and your health needs are our priority.  As part of our continuing mission to provide you with exceptional heart care, we have created designated Provider Care Teams.  These Care Teams include your primary Cardiologist (physician) and Advanced Practice Providers (APPs -  Physician Assistants and Nurse Practitioners) who all work together to provide you with the care you need, when you need it. ? ?We recommend signing up for the patient portal called "MyChart".  Sign up information is provided on this After Visit Summary.  MyChart is used to connect with patients for Virtual Visits (Telemedicine).  Patients are able to view lab/test results, encounter notes, upcoming appointments, etc.  Non-urgent messages can be sent to your provider as well.   ?To learn more about what you can do with MyChart, go to [email or web address].   ? ?Your next appointment:   ? Wednesday June 18, 2022 at 11:40 am ? ?The format for your next appointment:   ?In Person ? ?Provider:   ?Phineas Inches, MD ? ? ? ?

## 2022-03-19 NOTE — Telephone Encounter (Signed)
LM for patient that this nurse was calling to see how they were doing after their treatment. Please call back to Dr. Gudena's nurse at [phone] if they have any questions or concerns regarding the treatment. 

## 2022-03-19 NOTE — Telephone Encounter (Signed)
-----   Message from Lester Carlyle, RN sent at 03/18/2022  3:20 PM EDT ----- ?Regarding: Dr. Lindi Adie ?Pt. first time Trastuzumab, Docetaxel, and Carboplatin. Digni cap and compression too. Tolerated treatment well, but a little anxious. 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 ? ?

## 2022-03-20 ENCOUNTER — Inpatient Hospital Stay: Payer: 59

## 2022-03-20 ENCOUNTER — Ambulatory Visit
Admission: RE | Admit: 2022-03-20 | Discharge: 2022-03-20 | Disposition: A | Discharge status: Home / Self Care | Payer: 59 | Source: Ambulatory Visit | Attending: Neurology | Admitting: Neurology

## 2022-03-20 ENCOUNTER — Other Ambulatory Visit: Payer: Self-pay

## 2022-03-20 VITALS — BP 127/76 | HR 59 | Temp 98.3°F | Resp 18

## 2022-03-20 DIAGNOSIS — R519 Headache, unspecified: Secondary | ICD-10-CM

## 2022-03-20 DIAGNOSIS — R292 Abnormal reflex: Secondary | ICD-10-CM

## 2022-03-20 DIAGNOSIS — C7949 Secondary malignant neoplasm of other parts of nervous system: Secondary | ICD-10-CM

## 2022-03-20 DIAGNOSIS — C50912 Malignant neoplasm of unspecified site of left female breast: Secondary | ICD-10-CM

## 2022-03-20 DIAGNOSIS — R449 Unspecified symptoms and signs involving general sensations and perceptions: Secondary | ICD-10-CM

## 2022-03-20 DIAGNOSIS — R531 Weakness: Secondary | ICD-10-CM

## 2022-03-20 DIAGNOSIS — R200 Anesthesia of skin: Secondary | ICD-10-CM

## 2022-03-20 DIAGNOSIS — H539 Unspecified visual disturbance: Secondary | ICD-10-CM

## 2022-03-20 DIAGNOSIS — Z170 Estrogen receptor positive status [ER+]: Secondary | ICD-10-CM

## 2022-03-20 DIAGNOSIS — C50012 Malignant neoplasm of nipple and areola, left female breast: Secondary | ICD-10-CM | POA: Diagnosis not present

## 2022-03-20 LAB — BRAIN NATRIURETIC PEPTIDE: BNP: 21.1 pg/mL (ref 0.0–100.0)

## 2022-03-20 IMAGING — MR MR CERVICAL SPINE WO/W CM
7 of 11 series · 27 of 48 positions shown · IV contrast (multihance)
Comparison: None Available.

CLINICAL DATA: Headaches and shooting pain in the face and legs.
Left-sided weakness and sensory deficit. Vision changes. Recently
diagnosed breast cancer.

EXAM:
MRI CERVICAL SPINE WITHOUT AND WITH CONTRAST
TECHNIQUE: Multiplanar and multiecho pulse sequences of the cervical spine, to
include the craniocervical junction and cervicothoracic junction,
were obtained without and with intravenous contrast.
CONTRAST:  15mL MULTIHANCE GADOBENATE DIMEGLUMINE 529 MG/ML IV SOLN

[Series 19: T1 · sagittal · 3.0mm · 0.66mm/px · 2 of 15 slices shown (1 of 3)]
[im 1/15]
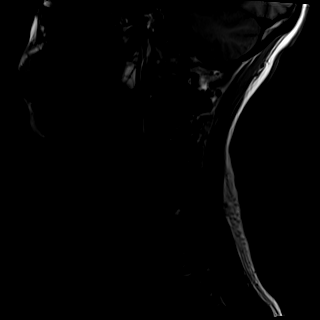
[im 15/15]
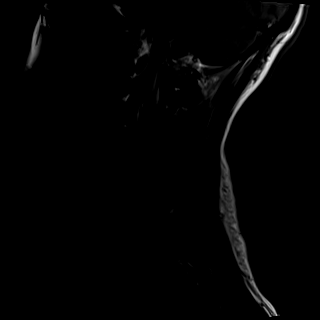

[Series 20: T2 · sagittal · 3.0mm · 0.55mm/px · 2 of 15 slices shown (1 of 3)]
[im 1/15]
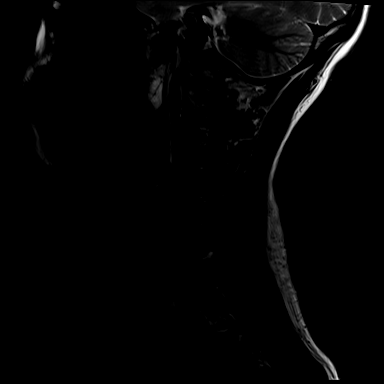
[im 15/15]
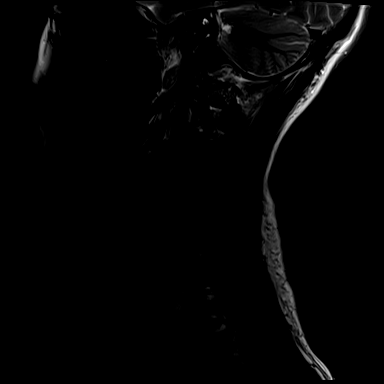

[Series 22: T2 · axial · 3.0mm · 0.50mm/px · z∈[-208,-114]mm · 6 of 32 slices shown (2 of 3)]
[im 1/32]
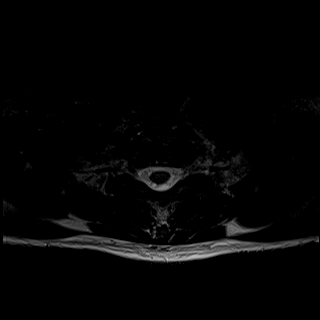
[im 7/32]
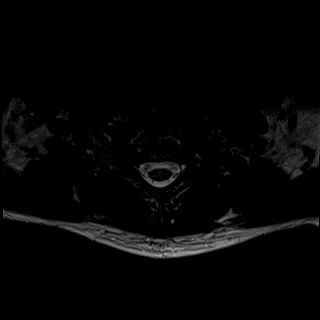
[im 13/32]
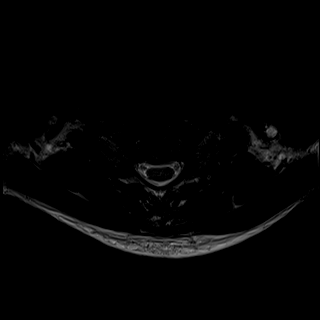
[im 19/32]
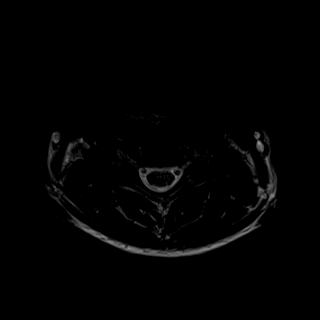
[im 25/32]
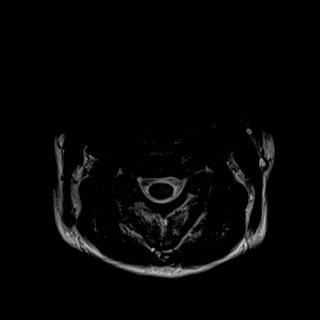
[im 32/32]
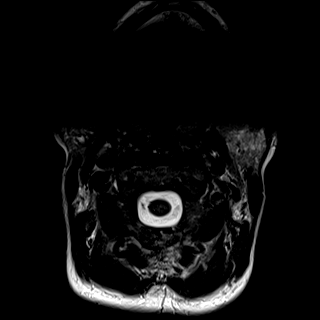

[Series 24: T1 · axial · non-contrast · 3.0mm · 0.31mm/px · z∈[-208,-114]mm · 6 of 32 slices shown (2 of 3)]
[im 1/32]
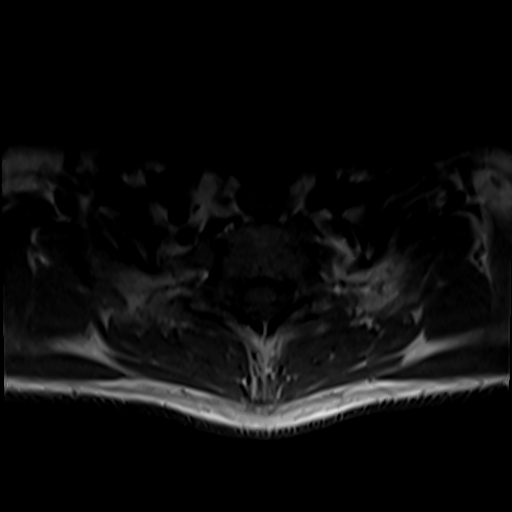
[im 7/32]
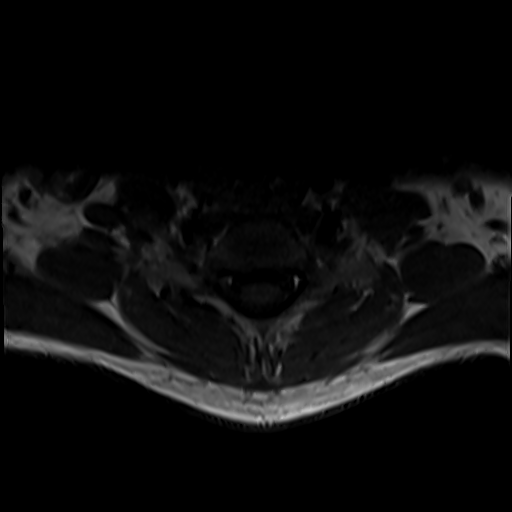
[im 13/32]
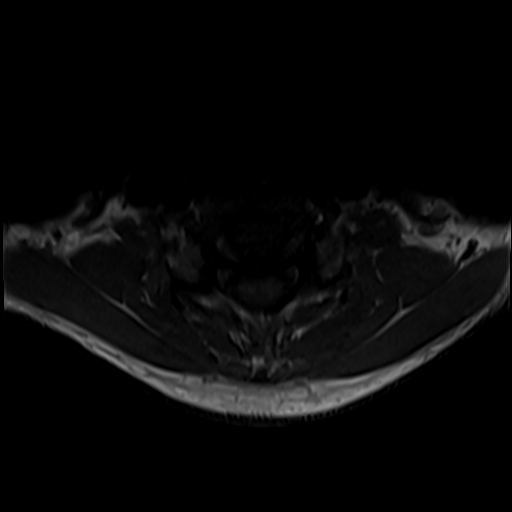
[im 19/32]
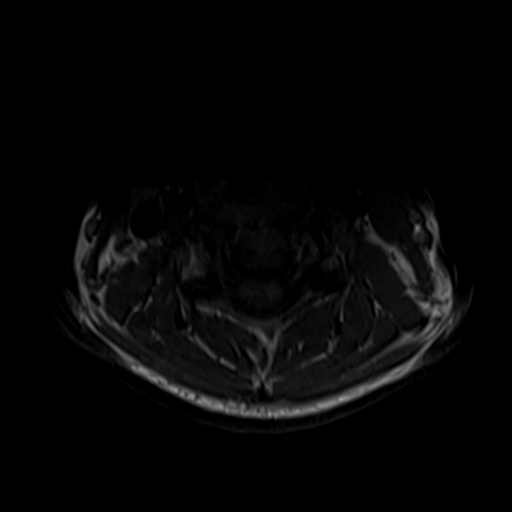
[im 25/32]
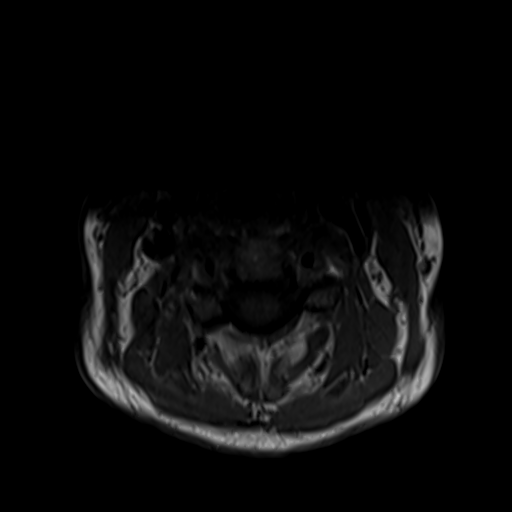
[im 32/32]
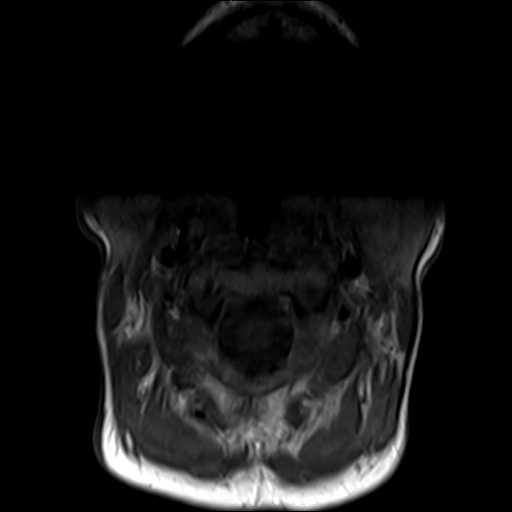

[Series 26: T1 · axial · non-contrast · 3.0mm · 0.31mm/px · z∈[-208,-114]mm · 6 of 32 slices shown (3 of 3)]
[im 1/32]
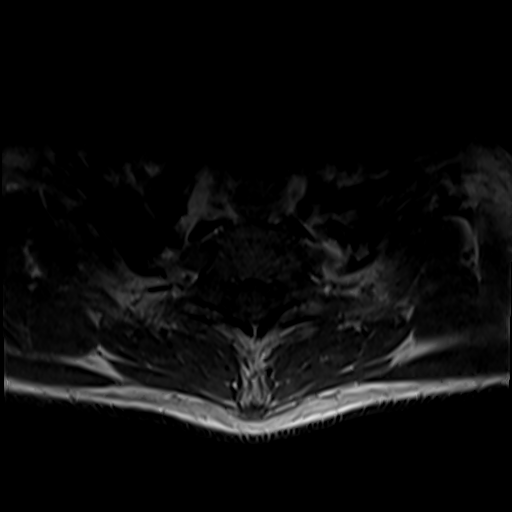
[im 7/32]
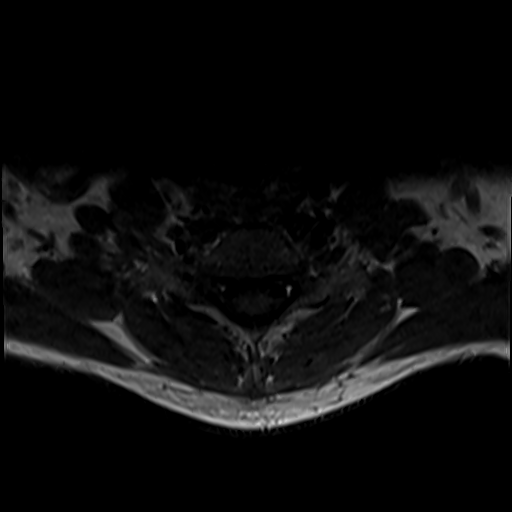
[im 13/32]
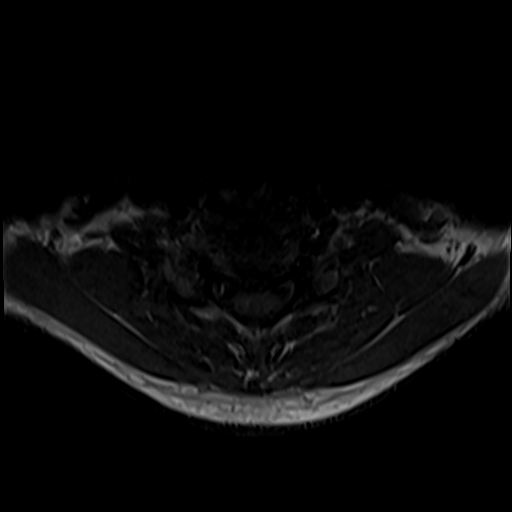
[im 19/32]
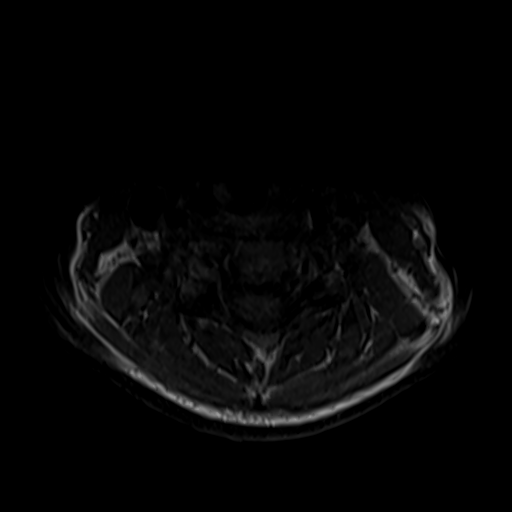
[im 25/32]
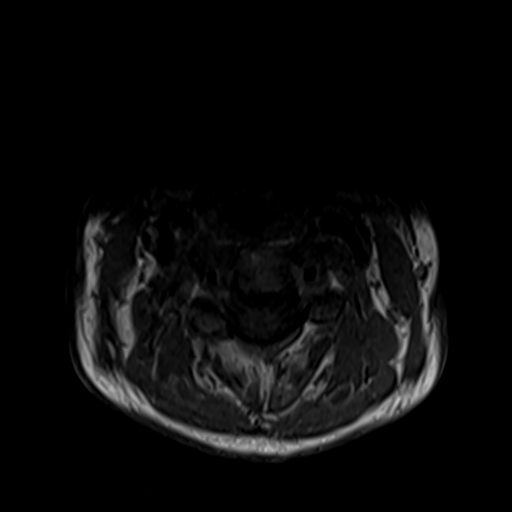
[im 32/32]
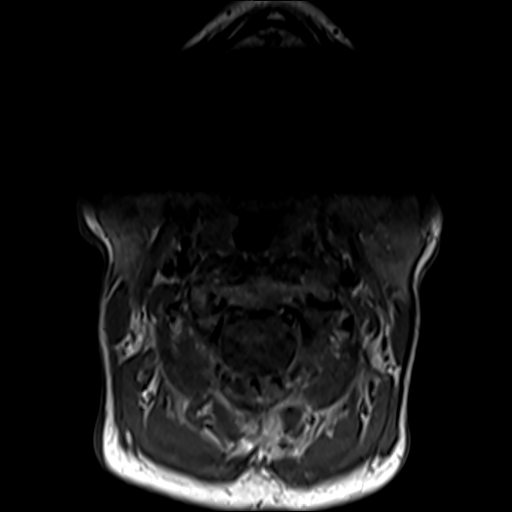

[Series 27: T2 · sagittal · 3.0mm · 0.55mm/px · 3 of 15 slices shown (3 of 3)]
[im 1/15]
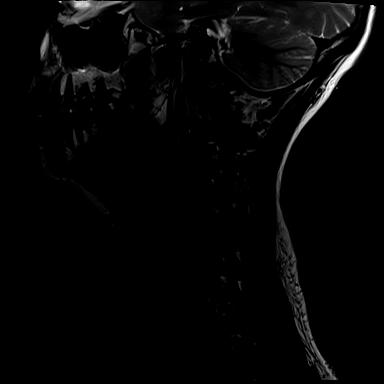
[im 8/15]
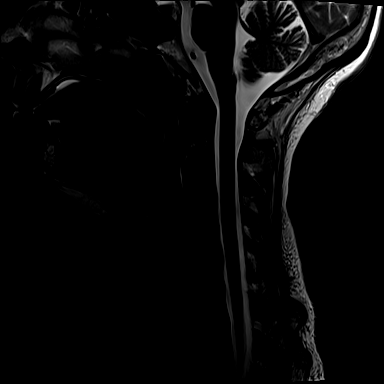
[im 15/15]
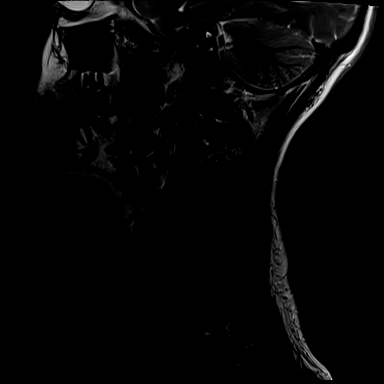

[Series 28: T1 fat-sat post-contrast · sagittal · 3.0mm · 0.66mm/px · 2 of 15 slices shown]
[im 1/15]
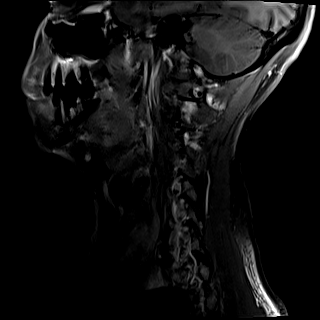
[im 8/15]
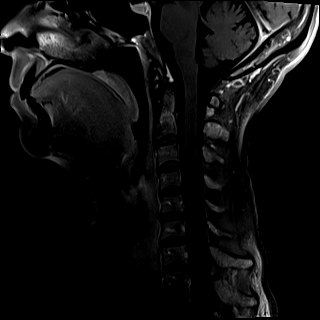

[27 of 48 positions shown; findings below may reference images not displayed]

FINDINGS: Some sequences are mildly to moderately motion degraded.

Alignment: Cervical spine straightening.  No listhesis.

Vertebrae: No fracture, suspicious marrow lesion, or significant
marrow edema. Subcentimeter hemangioma in the C6 vertebral body.

Cord: Normal cord signal and morphology. No abnormal intradural
enhancement.

Posterior Fossa, vertebral arteries, paraspinal tissues:
Unremarkable.

Disc levels:

C2-3: Mild right facet arthrosis without disc herniation or
stenosis.

C3-4: A small right posterolateral disc protrusion and right
uncovertebral spurring result in mild right neural foraminal
stenosis. No spinal stenosis.

C4-5: Mild right uncovertebral spurring results in borderline to
mild right neural foraminal stenosis without spinal stenosis.

C5-6: Shallow right paracentral disc protrusion without stenosis.

C6-7: Negative.

C7-T1: Negative.
IMPRESSION: 1. No evidence of metastatic disease in the cervical spine.
2. Mild cervical spondylosis without compressive stenosis.

## 2022-03-20 IMAGING — MR MR HEAD WO/W CM
16 series · 48 of 48 positions shown · IV contrast (multihance)
Comparison: None Available.

CLINICAL DATA: Headaches and shooting pain in the face and legs.
Left-sided weakness and sensory deficit. Vision changes. Recently
diagnosed breast cancer.

EXAM:
MRI HEAD WITHOUT AND WITH CONTRAST
TECHNIQUE: Multiplanar, multiecho pulse sequences of the brain and surrounding
structures were obtained without and with intravenous contrast.
CONTRAST:  15mL MULTIHANCE GADOBENATE DIMEGLUMINE 529 MG/ML IV SOLN

[Series 5: T1 · sagittal · 4.0mm · 0.75mm/px · 1 of 31 slices shown (1 of 4)]
[im 1/31]
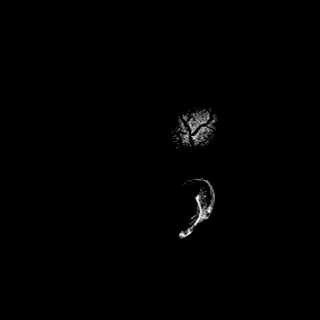

[Series 6: DWI · axial · 3.0mm · 0.94mm/px · z∈[-91,+60]mm · 8 of 176 slices shown (1 of 3)]
[im 1/176]
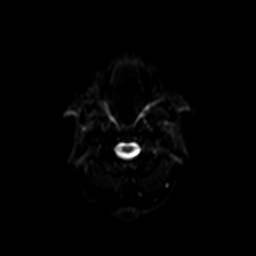
[im 26/176]
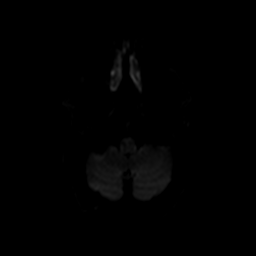
[im 51/176]
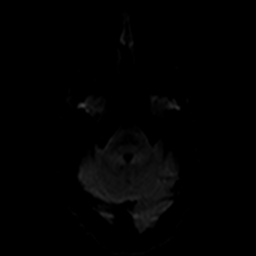
[im 76/176]
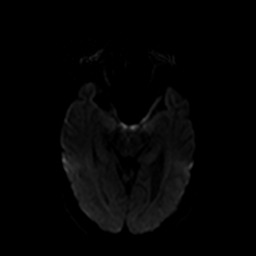
[im 101/176]
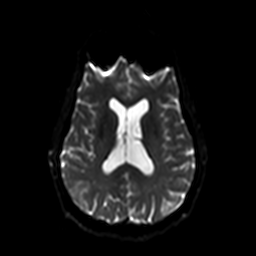
[im 126/176]
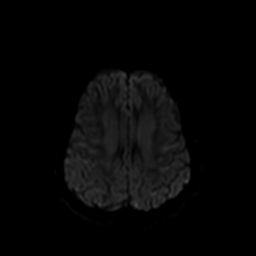
[im 151/176]
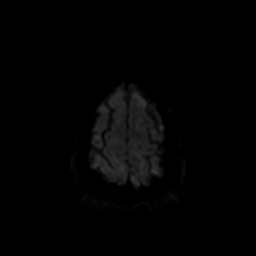
[im 176/176]
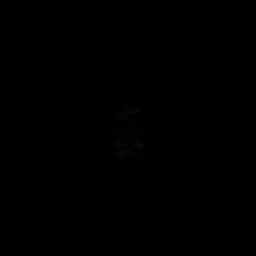

[Series 7: ax dwi_tracew · axial · 3.0mm · 0.94mm/px · z∈[-91,+60]mm · 4 of 88 slices shown]
[im 1/88]
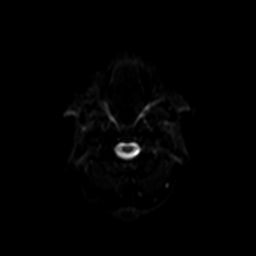
[im 30/88]
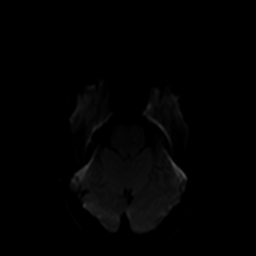
[im 59/88]
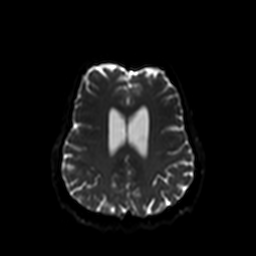
[im 88/88]
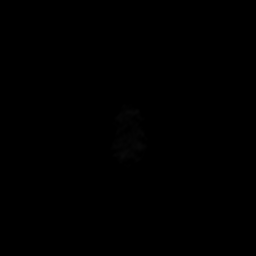

[Series 8: ax dwi_adc · axial · 3.0mm · 0.94mm/px · z∈[-91,+60]mm · 2 of 44 slices shown]
[im 1/44]
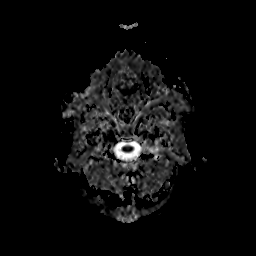
[im 44/44]
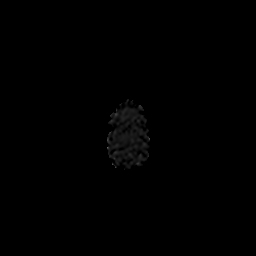

[Series 9: DWI · coronal · 5.0mm · 1.44mm/px · 3 of 68 slices shown (2 of 3)]
[im 1/68]
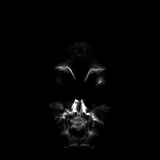
[im 34/68]
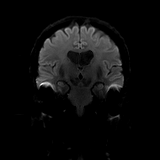
[im 68/68]
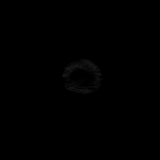

[Series 10: DWI · coronal · 5.0mm · 1.44mm/px · 2 of 34 slices shown (3 of 3)]
[im 1/34]
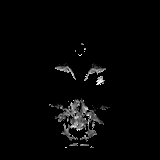
[im 34/34]
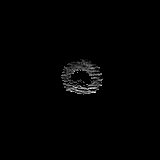

[Series 11: T1 · sagittal · 4.0mm · 0.75mm/px · 1 of 29 slices shown (2 of 4)]
[im 1/29]
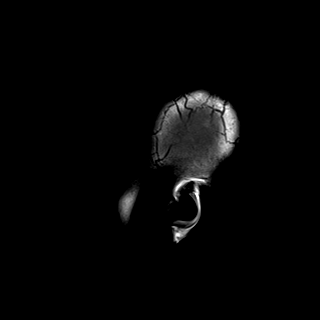

[Series 12: T2 · axial · 4.0mm · 0.36mm/px · 1 of 32 slices shown (1 of 2)]
[im 1/32]
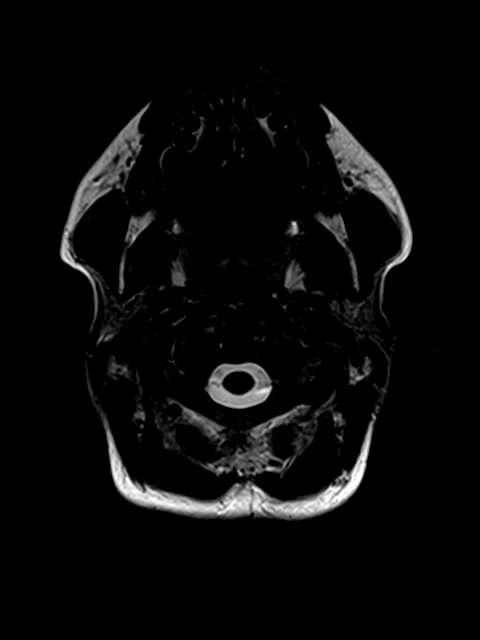

[Series 13: FLAIR · axial · 3.0mm · 0.72mm/px · 1 of 26 slices shown]
[im 1/26]
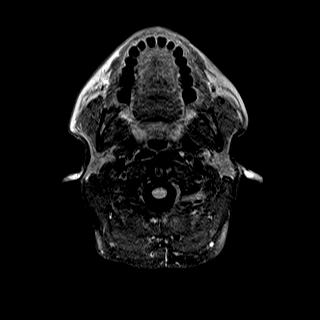

[Series 14: mip_images(sw) · axial · 24.0mm · 0.90mm/px · z∈[-94,+70]mm · 3 of 57 slices shown]
[im 1/57]
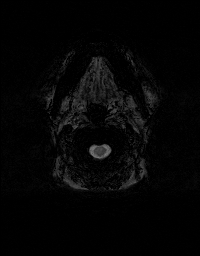
[im 29/57]
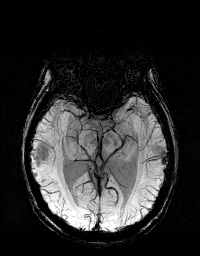
[im 57/57]
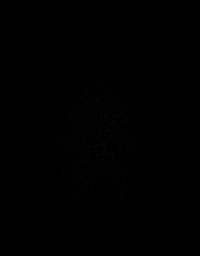

[Series 15: swi_images · axial · 3.0mm · 0.90mm/px · z∈[-105,+81]mm · 3 of 64 slices shown]
[im 1/64]
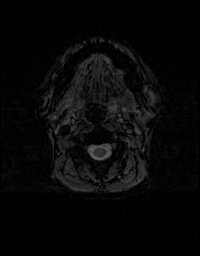
[im 32/64]
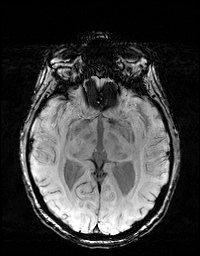
[im 64/64]
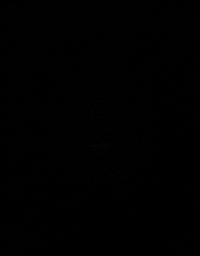

[Series 16: T1 · axial · 1.0mm · 0.90mm/px · z∈[-95,+60]mm · 7 of 160 slices shown (3 of 4)]
[im 1/160]
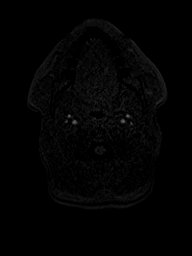
[im 27/160]
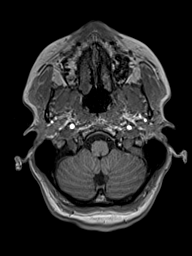
[im 54/160]
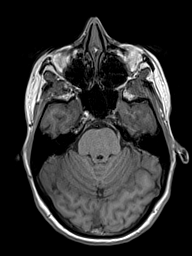
[im 80/160]
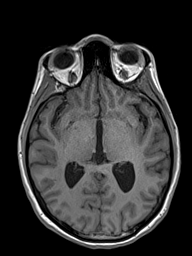
[im 107/160]
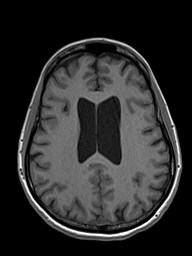
[im 133/160]
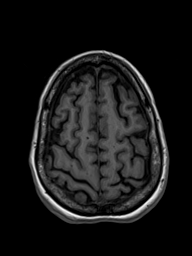
[im 160/160]
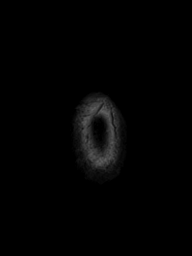

[Series 17: T2 · coronal · 4.0mm · 0.36mm/px · 2 of 36 slices shown (2 of 2)]
[im 1/36]
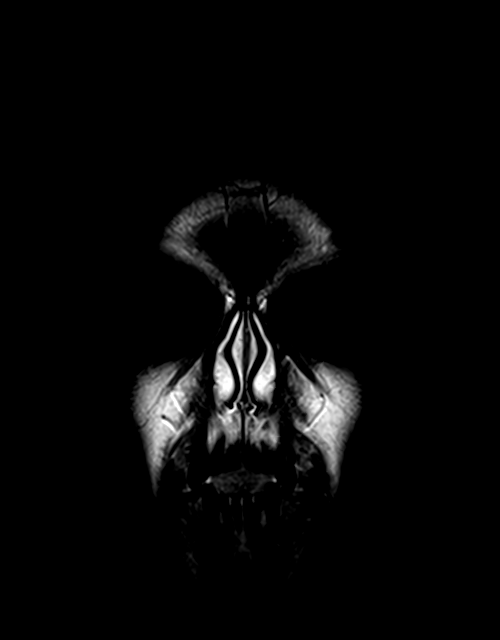
[im 36/36]
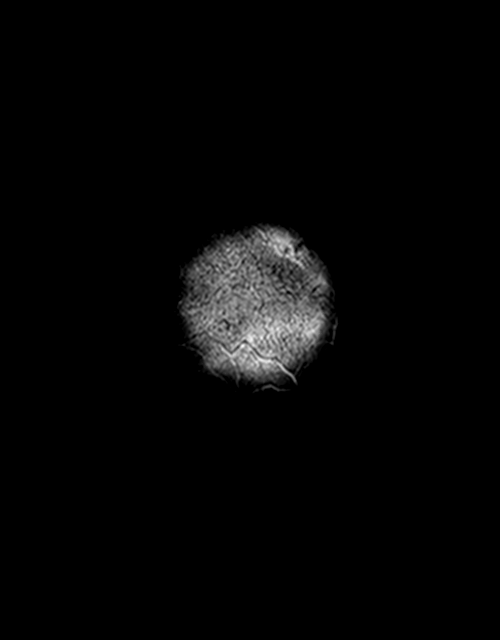

[Series 30: T1 · axial · 1.0mm · 0.90mm/px · z∈[-95,+59]mm · 7 of 159 slices shown (4 of 4)]
[im 1/159]
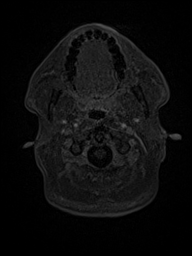
[im 27/159]
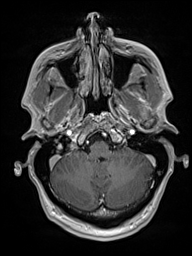
[im 53/159]
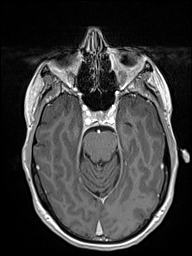
[im 80/159]
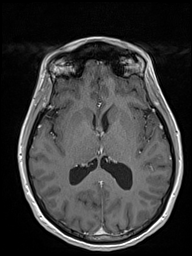
[im 106/159]
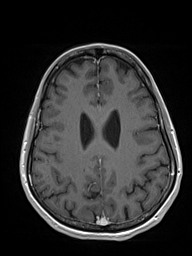
[im 132/159]
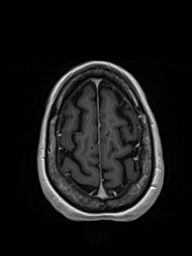
[im 159/159]
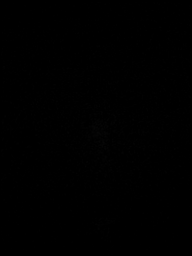

[Series 31: T1 post-contrast · coronal · 4.0mm · 0.72mm/px · 2 of 36 slices shown (1 of 2)]
[im 1/36]
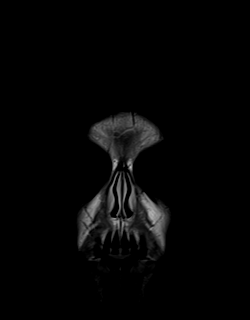
[im 36/36]
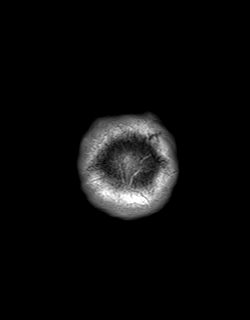

[Series 32: T1 post-contrast · sagittal · 4.0mm · 0.75mm/px · 1 of 29 slices shown (2 of 2)]
[im 1/29]
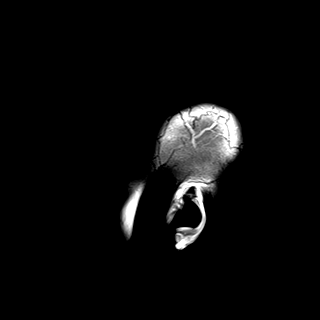

[48 of 48 positions shown; findings below may reference images not displayed]

FINDINGS: Brain: There is no evidence of an acute infarct, intracranial
hemorrhage, mass, midline shift, or extra-axial fluid collection.
The ventricles and sulci are within normal limits. The cerebellar
tonsils are normally positioned. The brain is normal in signal. No
abnormal enhancement is identified.

Vascular: Major intracranial vascular flow voids are preserved.

Skull and upper cervical spine: Unremarkable bone marrow signal.

Sinuses/Orbits: Unremarkable orbits. Paranasal sinuses and mastoid
air cells are clear.

Other: None.
IMPRESSION: Negative head MRI.

## 2022-03-20 MED ORDER — PEGFILGRASTIM-BMEZ 6 MG/0.6ML ~~LOC~~ SOSY
6.0000 mg | PREFILLED_SYRINGE | Freq: Once | SUBCUTANEOUS | Status: AC
  Administered 2022-03-20: 6 mg via SUBCUTANEOUS
  Filled 2022-03-20: qty 0.6

## 2022-03-20 MED ORDER — GADOBENATE DIMEGLUMINE 529 MG/ML IV SOLN
15.0000 mL | Freq: Once | INTRAVENOUS | Status: AC | PRN
  Administered 2022-03-20: 15 mL via INTRAVENOUS

## 2022-03-21 DIAGNOSIS — Z170 Estrogen receptor positive status [ER+]: Secondary | ICD-10-CM

## 2022-03-21 NOTE — Research (Signed)
[id number] - TREATMENT OF REFRACTORY NAUSEA ? ?Called and connected with patient for cycle 1 day 4 call. ? ?PROs: Patient stated she was getting reminders for online questionnaires; would continue to complete them as prompted. Patient reminded to bring paper copy of the 4 day home record to her toxicity check appointment on Tuesday, 5/16, for research to collect. ? ?ADVERSE EVENTS: Patient did not report any adverse events.  ? ?4 DAY HOME RECORD: Patient reviewed her record and stated that she did not report a score of 3 or greater. ? ?PLAN: Patient informed that she was not eligible to randomize to cycle 2 since her nausea was well-controlled. Patient thanked for her time and continued voluntary participation in this study. Patient has been provided direct contact information and is encouraged to contact this nurse for any questions or concerns. ? ?Vickii Penna, RN, BSN, CPN ?Clinical Research Nurse I ?[phone] ? ?03/21/2022 9:27 AM ? ?

## 2022-03-24 ENCOUNTER — Telehealth: Payer: Self-pay

## 2022-03-24 NOTE — Telephone Encounter (Signed)
Return call to pt to f/u from after hours call.  Pt reports red rash on chest since receiving injection.  Pt states she plans to have it reviewed further with Dr Lindi Adie on 5/16 and that she is out doing things today, also not concerned for immediate care, just wanted to let us know.   ?

## 2022-03-25 ENCOUNTER — Inpatient Hospital Stay: Payer: 59 | Admitting: Hematology and Oncology

## 2022-03-25 ENCOUNTER — Other Ambulatory Visit: Payer: Self-pay

## 2022-03-25 ENCOUNTER — Encounter: Payer: Self-pay | Admitting: Radiology

## 2022-03-25 ENCOUNTER — Encounter: Payer: Self-pay | Admitting: *Deleted

## 2022-03-25 ENCOUNTER — Inpatient Hospital Stay: Payer: 59

## 2022-03-25 DIAGNOSIS — C50012 Malignant neoplasm of nipple and areola, left female breast: Secondary | ICD-10-CM | POA: Diagnosis not present

## 2022-03-25 DIAGNOSIS — Z170 Estrogen receptor positive status [ER+]: Secondary | ICD-10-CM

## 2022-03-25 LAB — CMP (CANCER CENTER ONLY)
ALT: 30 U/L (ref 0–44)
AST: 22 U/L (ref 15–41)
Albumin: 4.1 g/dL (ref 3.5–5.0)
Alkaline Phosphatase: 57 U/L (ref 38–126)
Anion gap: 8 (ref 5–15)
BUN: 16 mg/dL (ref 6–20)
CO2: 25 mmol/L (ref 22–32)
Calcium: 9.8 mg/dL (ref 8.9–10.3)
Chloride: 103 mmol/L (ref 98–111)
Creatinine: 1.01 mg/dL — ABNORMAL HIGH (ref 0.44–1.00)
GFR, Estimated: >60 mL/min (ref >60)
Glucose, Bld: 109 mg/dL — ABNORMAL HIGH (ref 70–99)
Potassium: 3.8 mmol/L (ref 3.5–5.1)
Sodium: 136 mmol/L (ref 135–145)
Total Bilirubin: 0.4 mg/dL (ref 0.3–1.2)
Total Protein: 7 g/dL (ref 6.5–8.1)

## 2022-03-25 LAB — URINALYSIS, COMPLETE (UACMP) WITH MICROSCOPIC
Bilirubin Urine: NEGATIVE
Glucose, UA: NEGATIVE mg/dL
Hgb urine dipstick: NEGATIVE
Ketones, ur: NEGATIVE mg/dL
Leukocytes,Ua: NEGATIVE
Nitrite: NEGATIVE
Protein, ur: NEGATIVE mg/dL
Specific Gravity, Urine: 1.003 — ABNORMAL LOW (ref 1.005–1.030)
pH: 8 (ref 5.0–8.0)

## 2022-03-25 LAB — CBC WITH DIFFERENTIAL/PLATELET
Abs Immature Granulocytes: 0.7 10*3/uL — ABNORMAL HIGH (ref 0.00–0.07)
Band Neutrophils: 29 %
Basophils Absolute: 0 10*3/uL (ref 0.0–0.1)
Basophils Relative: 0 %
Eosinophils Absolute: 0.1 10*3/uL (ref 0.0–0.5)
Eosinophils Relative: 1 %
HCT: 37.8 % (ref 36.0–46.0)
Hemoglobin: 13.3 g/dL (ref 12.0–15.0)
Lymphocytes Relative: 30 %
Lymphs Abs: 4.3 10*3/uL — ABNORMAL HIGH (ref 0.7–4.0)
MCH: 32.4 pg (ref 26.0–34.0)
MCHC: 35.2 g/dL (ref 30.0–36.0)
MCV: 92.2 fL (ref 80.0–100.0)
Metamyelocytes Relative: 3 %
Monocytes Absolute: 3 10*3/uL — ABNORMAL HIGH (ref 0.1–1.0)
Monocytes Relative: 21 %
Myelocytes: 2 %
Neutro Abs: 6.2 10*3/uL (ref 1.7–7.7)
Neutrophils Relative %: 14 %
Platelets: 200 10*3/uL (ref 150–400)
RBC: 4.1 MIL/uL (ref 3.87–5.11)
RDW: 11.9 % (ref 11.5–15.5)
Smear Review: NORMAL
WBC: 14.4 10*3/uL — ABNORMAL HIGH (ref 4.0–10.5)
nRBC: 0.3 % — ABNORMAL HIGH (ref 0.0–0.2)

## 2022-03-25 MED ORDER — HEPARIN SOD (PORK) LOCK FLUSH 100 UNIT/ML IV SOLN
500.0000 [IU] | INTRAVENOUS | Status: AC | PRN
  Administered 2022-03-25: 500 [IU]

## 2022-03-25 MED ORDER — SODIUM CHLORIDE 0.9% FLUSH
10.0000 mL | INTRAVENOUS | Status: AC | PRN
  Administered 2022-03-25: 10 mL

## 2022-03-25 NOTE — Progress Notes (Signed)
? ?Patient Care Team: ?Donnajean Lopes, MD as PCP - General (Internal Medicine) ?Mauro Kaufmann, RN as Oncology Nurse Navigator ?Rockwell Germany, RN as Oncology Nurse Navigator ?Nicholas Lose, MD as Consulting Physician (Hematology and Oncology) ?Rolm Bookbinder, MD as Consulting Physician (General Surgery) ? ?DIAGNOSIS:  ?Encounter Diagnosis  ?Name Primary?  ? 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 ? 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 History of subpectoral implants, palpable left breast mass by ultrasound 1.4 cm.  Multiple additional masses 1.1 and 0.9 cm.  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  Breast MRI showed at least 7 different masses largest 1.6 cm.  No lymphadenopathy ?  ?03/17/2022 Genetic Testing  ? Negative genetic testing through the CancerNext performed at [facility] Surgery office.  The report date is Mar 17, 2022. ? ?The CancerNext gene panel offered by [employer] includes sequencing and rearrangement analysis for the following 34 genes:   APC, ATM, BARD1, BMPR1A, BRCA1, BRCA2, BRIP1, CDH1, CDK4, CDKN2A, CHEK2, DICER1, HOXB13, EPCAM, GREM1, MLH1, MRE11A, MSH2, MSH6, MUTYH, NBN, NF1, PALB2, PMS2, POLD1, POLE, PTEN, RAD50, RAD51C, RAD51D, SMAD4, SMARCA4, STK11, and TP53.   ?  ?03/18/2022 -  Chemotherapy  ? Patient is on Treatment Plan : BREAST Docetaxel + Carboplatin + Trastuzumab (TCH) q21d / Trastuzumab q21d  ? ?   ? ? ?CHIEF COMPLIANT: Follow up cyle 1 TCH ? ?INTERVAL HISTORY: [name] is a 41 year old with above-mentioned history of of breast cancer that is HER2 positive on [facility]. She state that she had some fatigue and a rash that appeared on her neck really itchy. She state that she had some burning  in her urinary. Saturday was the rash. Sunday she had some bone pain. Overall she tolerated TCH. She state she had no nausea. She state that her bowels are good. Denies mouth sores but states her taste buds are bland. States she doesn't really eat sweets. States mouth is dry. ? ?ALLERGIES:  has No Known Allergies. ? ?MEDICATIONS:  ?Current Outpatient Medications  ?Medication Sig Dispense Refill  ? clonazePAM (KLONOPIN) 1 MG tablet Take 1 mg by mouth 2 (two) times daily as needed.    ? Dexamethasone (DECADRON PO) Take by mouth.    ? ondansetron (ZOFRAN) 8 MG tablet Take by mouth every 8 (eight) hours as needed for nausea or vomiting.    ? traMADol (ULTRAM) 50 MG tablet Take 1 tablet (50 mg total) by mouth every 6 (six) hours as needed. 6 tablet 0  ? ?No current facility-administered medications for this visit.  ? ? ?PHYSICAL EXAMINATION: ?ECOG PERFORMANCE STATUS: 1 - Symptomatic but completely ambulatory ? ?Vitals:  ? 03/25/22 0819  ?BP: (!) 152/84  ?Pulse: 82  ?Resp: 18  ?Temp: 97.8 ?F (36.6 ?C)  ?SpO2: 100%  ? ?Filed Weights  ? 03/25/22 0819  ?Weight: 155 lb 11.2 oz (70.6 kg)  ? ?  ? ?LABORATORY DATA:  ?I have reviewed the data as listed ? ?  Latest Ref Rng & Units 03/17/2022  ? 12:56 PM 12/12/2009  ?  7:31 AM 12/27/2007  ?  8:28 AM  ?CMP  ?Glucose 70 - 99 mg/dL 122   90   99    ?BUN 6 - 20 mg/dL 10  19   16    ?Creatinine 0.44 - 1.00 mg/dL 0.81   1.0   1.1    ?Sodium 135 - 145 mmol/L 136   138   138    ?Potassium 3.5 - 5.1 mmol/L 3.7   4.2   3.8    ?Chloride 98 - 111 mmol/L 104   106   106    ?CO2 22 - 32 mmol/L _0 ?Calcium 8.9 - 10.3 mg/dL 8.6   9.2   9.4    ?Total Protein 6.5 - 8.1 g/dL 5.7   6.9   6.7    ?Total Bilirubin 0.3 - 1.2 mg/dL 1.1   0.9   1.0    ?Alkaline Phos 38 - 126 U/L 32   42   35    ?AST 15 - 41 U/L _1 ?ALT 0 - 44 U/L _2 ? ? ?Lab Results  ?Component Value Date  ? WBC 14.4 (H) 03/25/2022  ? HGB 13.3 03/25/2022  ? HCT 37.8 03/25/2022  ? MCV 92.2 03/25/2022  ? PLT  200 03/25/2022  ? NEUTROABS PENDING 03/25/2022  ? ? 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  Multiple additional masses 1.1 and 0.9 cm.  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  Breast MRI showed at least 7 different masses largest 1.6 cm.  No lymphadenopathy ?  ?Treatment plan: ?1. Neoadjuvant chemotherapy with TCH  ?6 cycles followed by Herceptin maintenance versus Kadcyla maintenance (based on response to neoadjuvant chemo) for 1 year ?2. Followed by mastectomy with sentinel lymph node study ?3. Followed by adjuvant antiestrogen therapy ?-------------------------------------------------------------------------------------------------------------------------------------------- ?Current treatment: Cycle 1 day 8 TCH (on 03/18/2022) ?Chemo Toxicities: ?Chest wall rash: Fine maculopapular rash: She is taking Benadryl as needed for the itching ?Bone pain due to Neulasta she is able to manage the pain with Tylenol ?Taste changes ?Burning urination: We will send for urine analysis today ?Return to clinic in 2 weeks for cycle 2 ? ? ? ?Orders Placed This Encounter  ?Procedures  ? Urinalysis, Complete w Microscopic  ? ?The patient has a good understanding of the overall plan. she agrees with it. she will call with any problems that may develop before the next visit here. ?Total time spent: 30 mins including face to face time and time spent for planning, charting and co-ordination of care ? ? Harriette Ohara, MD ?03/25/22 ? ? ? I Gardiner Coins am scribing for Dr. Lindi Adie ? ?I have reviewed the above documentation for accuracy and completeness, and I agree with the above. ?  ?

## 2022-03-25 NOTE — Research (Signed)
[id number] - TREATMENT OF REFRACTORY NAUSEA  ? ?03/25/2022 ? ?VISIT: Met with patient after her port flush appointment to collect PROs for the above mentioned study. Patient states she is doing well overall, but has had bone pain from the Neulasta injections. Patient provided PROs and was thanked for her time and participation in the above mentioned study.  ? ?Carol Ada, RT(R)(T) ?Clinical Research Coordinator ? ?

## 2022-03-25 NOTE — Assessment & Plan Note (Signed)
03/04/2022 History of subpectoral implants, palpable left breast mass by ultrasound 1.4 cm. ?Multiple additional masses 1.1 and 0.9 cm. ?Ultrasound biopsy revealed grade 2 IDC ER 95%, PR 100%, HER2 positive, Ki-67 10%. ?Breast MRI showed at least 7 different masses largest 1.6 cm. ?No lymphadenopathy ?? ?Treatment plan: ?1. Neoadjuvant chemotherapy with TCH ??6 cycles followed by Herceptin maintenance versus Kadcyla maintenance (based on response to neoadjuvant chemo) for 1 year ?2. Followed by?mastectomy?with sentinel lymph node study ?3. Followed by adjuvant?antiestrogen therapy ?-------------------------------------------------------------------------------------------------------------------------------------------- ?Current treatment: Cycle 1 day 8 TCH (on 03/18/2022) ?Chemo Toxicities: ? ?Return to clinic in 2 weeks for cycle 2 ? ?

## 2022-03-28 NOTE — Progress Notes (Signed)
Patient Care Team: Donnajean Lopes, MD as PCP - General (Internal Medicine) Mauro Kaufmann, RN as Oncology Nurse Navigator Rockwell Germany, RN as Oncology Nurse Navigator Nicholas Lose, MD as Consulting Physician (Hematology and Oncology) Rolm Bookbinder, MD as Consulting Physician (General Surgery)  DIAGNOSIS:  Encounter Diagnosis  Name Primary?   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  Multiple additional masses 1.1 and 0.9 cm.  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  Breast MRI showed at least 7 different masses largest 1.6 cm.  No lymphadenopathy   03/17/2022 Genetic Testing   Negative genetic testing through the CancerNext performed at [facility] Surgery office.  The report date is Mar 17, 2022.  The CancerNext gene panel offered by [employer] includes sequencing and rearrangement analysis for the following 34 genes:   APC, ATM, BARD1, BMPR1A, BRCA1, BRCA2, BRIP1, CDH1, CDK4, CDKN2A, CHEK2, DICER1, HOXB13, EPCAM, GREM1, MLH1, MRE11A, MSH2, MSH6, MUTYH, NBN, NF1, PALB2, PMS2, POLD1, POLE, PTEN, RAD50, RAD51C, RAD51D, SMAD4, SMARCA4, STK11, and TP53.     03/18/2022 -  Chemotherapy   Patient is on Treatment Plan : BREAST Docetaxel + Carboplatin + Trastuzumab (TCH) q21d / Trastuzumab q21d        CHIEF COMPLIANT: Follow up cyle 2 TCH    INTERVAL HISTORY: ARTHA STAVROS is a  41 year old with above-mentioned history of of breast cancer that is HER2 positive on [facility]. She presents to the clinic today for a follow-up she had a cold sore on her lip as well as a marked improvement in her  taste.  She has noticed some hair thinning.  Did not have any nausea vomiting diarrhea or constipation.  The bone pain resolved after 3 days.   ALLERGIES:  has No Known Allergies.  MEDICATIONS:  Current Outpatient Medications  Medication Sig Dispense Refill   hydrOXYzine (ATARAX) 10 MG tablet Take 1 tablet (10 mg total) by mouth 3 (three) times daily as needed. 30 tablet 0   clonazePAM (KLONOPIN) 1 MG tablet Take 1 mg by mouth 2 (two) times daily as needed.     Dexamethasone (DECADRON PO) Take by mouth.     ondansetron (ZOFRAN) 8 MG tablet Take by mouth every 8 (eight) hours as needed for nausea or vomiting.     traMADol (ULTRAM) 50 MG tablet Take 1 tablet (50 mg total) by mouth every 6 (six) hours as needed. 6 tablet 0   tranexamic acid (LYSTEDA) 650 MG TABS tablet Take 2 tablets (1,300 mg total) by mouth 3 (three) times daily. 30 tablet 1   No current facility-administered medications for this visit.    PHYSICAL EXAMINATION: ECOG PERFORMANCE STATUS: 1 - Symptomatic but completely ambulatory  Vitals:   04/09/22 0810  BP: 133/77  Pulse: 66  Resp: 18  Temp: (!) 97.3 F (36.3 C)  SpO2: 100%   Filed Weights   04/09/22 0810  Weight: 159 lb 14.4 oz (72.5 kg)      LABORATORY DATA:  I have reviewed the data as listed    Latest Ref Rng & Units 03/25/2022    8:01 AM 03/17/2022   12:56 PM 12/12/2009  7:31 AM  CMP  Glucose 70 - 99 mg/dL 109   122   90    BUN 6 - 20 mg/dL _0 Creatinine 0.44 - 1.00 mg/dL 1.01   0.81   1.0    Sodium 135 - 145 mmol/L 136   136   138    Potassium 3.5 - 5.1 mmol/L 3.8   3.7   4.2    Chloride 98 - 111 mmol/L 103   104   106    CO2 22 - 32 mmol/L _1 Calcium 8.9 - 10.3 mg/dL 9.8   8.6   9.2    Total Protein 6.5 - 8.1 g/dL 7.0   5.7   6.9    Total Bilirubin 0.3 - 1.2 mg/dL 0.4   1.1   0.9    Alkaline Phos 38 - 126 U/L 57   32   42    AST 15 - 41 U/L _2 ALT 0 - 44 U/L _3 Lab Results  Component  Value Date   WBC 7.8 04/09/2022   HGB 12.0 04/09/2022   HCT 34.1 (L) 04/09/2022   MCV 93.2 04/09/2022   PLT 268 04/09/2022   NEUTROABS 4.0 04/09/2022    ASSESSMENT & PLAN:  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  Multiple additional masses 1.1 and 0.9 cm.  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  Breast MRI showed at least 7 different masses largest 1.6 cm.  No lymphadenopathy   Treatment plan: 1. Neoadjuvant chemotherapy with TCH  6 cycles followed by Herceptin maintenance versus Kadcyla maintenance (based on response to neoadjuvant chemo) for 1 year 2. Followed by mastectomy with sentinel lymph node study 3. Followed by adjuvant antiestrogen therapy -------------------------------------------------------------------------------------------------------------------------------------------- Current treatment: Cycle 2 TCH (on 03/18/2022) Chemo Toxicities: Chest wall rash: Fine maculopapular rash: She is taking Benadryl as needed for the itching, sent a prescription for Atarax Bone pain due to Neulasta she is able to manage the pain with Tylenol, advised to take Motrin for couple of days if needed Taste changes  She is contemplating on getting plastic surgery at [facility] with a plastic surgeon named Dr. Harle Stanford.  She has an appointment to talk to them. Return to clinic in 3 weeks for cycle 3    No orders of the defined types were placed in this encounter.  The patient has a good understanding of the overall plan. she agrees with it. she will call with any problems that may develop before the next visit here. Total time spent: 30 mins including face to face time and time spent for planning, charting and co-ordination of care   Harriette Ohara, MD 04/09/22    I Gardiner Coins am  scribing for Dr. Lindi Adie  I have reviewed the above documentation for accuracy and completeness, and I agree with the above.

## 2022-04-04 ENCOUNTER — Other Ambulatory Visit (HOSPITAL_COMMUNITY): Payer: 59

## 2022-04-05 ENCOUNTER — Encounter (HOSPITAL_BASED_OUTPATIENT_CLINIC_OR_DEPARTMENT_OTHER): Payer: Self-pay | Admitting: Obstetrics & Gynecology

## 2022-04-06 ENCOUNTER — Other Ambulatory Visit (HOSPITAL_BASED_OUTPATIENT_CLINIC_OR_DEPARTMENT_OTHER): Payer: Self-pay | Admitting: Obstetrics & Gynecology

## 2022-04-06 MED ORDER — TRANEXAMIC ACID 650 MG PO TABS: 1300.0000 mg | ORAL_TABLET | Freq: Three times a day (TID) | ORAL | 1 refills | Status: DC

## 2022-04-08 MED FILL — Fosaprepitant Dimeglumine For IV Infusion 150 MG (Base Eq): INTRAVENOUS | Qty: 5 | Status: AC

## 2022-04-08 MED FILL — Dexamethasone Sodium Phosphate Inj 100 MG/10ML: INTRAMUSCULAR | Qty: 1 | Status: AC

## 2022-04-09 ENCOUNTER — Inpatient Hospital Stay: Payer: 59

## 2022-04-09 ENCOUNTER — Encounter: Payer: Self-pay | Admitting: Hematology and Oncology

## 2022-04-09 ENCOUNTER — Other Ambulatory Visit: Payer: Self-pay

## 2022-04-09 ENCOUNTER — Inpatient Hospital Stay (HOSPITAL_BASED_OUTPATIENT_CLINIC_OR_DEPARTMENT_OTHER): Payer: 59 | Admitting: Hematology and Oncology

## 2022-04-09 DIAGNOSIS — C50012 Malignant neoplasm of nipple and areola, left female breast: Secondary | ICD-10-CM

## 2022-04-09 DIAGNOSIS — Z170 Estrogen receptor positive status [ER+]: Secondary | ICD-10-CM | POA: Diagnosis not present

## 2022-04-09 DIAGNOSIS — Z95828 Presence of other vascular implants and grafts: Secondary | ICD-10-CM

## 2022-04-09 LAB — CBC WITH DIFFERENTIAL/PLATELET
Abs Immature Granulocytes: 0.05 10*3/uL (ref 0.00–0.07)
Basophils Absolute: 0 10*3/uL (ref 0.0–0.1)
Basophils Relative: 1 %
Eosinophils Absolute: 0.1 10*3/uL (ref 0.0–0.5)
Eosinophils Relative: 1 %
HCT: 34.1 % — ABNORMAL LOW (ref 36.0–46.0)
Hemoglobin: 12 g/dL (ref 12.0–15.0)
Immature Granulocytes: 1 %
Lymphocytes Relative: 33 %
Lymphs Abs: 2.6 10*3/uL (ref 0.7–4.0)
MCH: 32.8 pg (ref 26.0–34.0)
MCHC: 35.2 g/dL (ref 30.0–36.0)
MCV: 93.2 fL (ref 80.0–100.0)
Monocytes Absolute: 1.1 10*3/uL — ABNORMAL HIGH (ref 0.1–1.0)
Monocytes Relative: 14 %
Neutro Abs: 4 10*3/uL (ref 1.7–7.7)
Neutrophils Relative %: 50 %
Platelets: 268 10*3/uL (ref 150–400)
RBC: 3.66 MIL/uL — ABNORMAL LOW (ref 3.87–5.11)
RDW: 13.5 % (ref 11.5–15.5)
WBC: 7.8 10*3/uL (ref 4.0–10.5)
nRBC: 0 % (ref 0.0–0.2)

## 2022-04-09 LAB — CMP (CANCER CENTER ONLY)
ALT: 27 U/L (ref 0–44)
AST: 21 U/L (ref 15–41)
Albumin: 4.1 g/dL (ref 3.5–5.0)
Alkaline Phosphatase: 59 U/L (ref 38–126)
Anion gap: 7 (ref 5–15)
BUN: 14 mg/dL (ref 6–20)
CO2: 27 mmol/L (ref 22–32)
Calcium: 9.2 mg/dL (ref 8.9–10.3)
Chloride: 105 mmol/L (ref 98–111)
Creatinine: 0.86 mg/dL (ref 0.44–1.00)
GFR, Estimated: >60 mL/min (ref >60)
Glucose, Bld: 101 mg/dL — ABNORMAL HIGH (ref 70–99)
Potassium: 3.4 mmol/L — ABNORMAL LOW (ref 3.5–5.1)
Sodium: 139 mmol/L (ref 135–145)
Total Bilirubin: 0.4 mg/dL (ref 0.3–1.2)
Total Protein: 6.8 g/dL (ref 6.5–8.1)

## 2022-04-09 MED ORDER — SODIUM CHLORIDE 0.9 % IV SOLN
10.0000 mg | Freq: Once | INTRAVENOUS | Status: AC
  Administered 2022-04-09: 10 mg via INTRAVENOUS
  Filled 2022-04-09: qty 10

## 2022-04-09 MED ORDER — SODIUM CHLORIDE 0.9% FLUSH
10.0000 mL | INTRAVENOUS | Status: DC | PRN
  Administered 2022-04-09: 10 mL

## 2022-04-09 MED ORDER — HEPARIN SOD (PORK) LOCK FLUSH 100 UNIT/ML IV SOLN
500.0000 [IU] | Freq: Once | INTRAVENOUS | Status: AC | PRN
  Administered 2022-04-09: 500 [IU]

## 2022-04-09 MED ORDER — HYDROXYZINE HCL 10 MG PO TABS: 10.0000 mg | ORAL_TABLET | Freq: Three times a day (TID) | ORAL | 0 refills | Status: DC | PRN

## 2022-04-09 MED ORDER — SODIUM CHLORIDE 0.9 % IV SOLN
150.0000 mg | Freq: Once | INTRAVENOUS | Status: AC
  Administered 2022-04-09: 150 mg via INTRAVENOUS
  Filled 2022-04-09: qty 150

## 2022-04-09 MED ORDER — SODIUM CHLORIDE 0.9 % IV SOLN: Freq: Once | INTRAVENOUS | Status: AC

## 2022-04-09 MED ORDER — SODIUM CHLORIDE 0.9 % IV SOLN
75.0000 mg/m2 | Freq: Once | INTRAVENOUS | Status: AC
  Administered 2022-04-09: 140 mg via INTRAVENOUS
  Filled 2022-04-09: qty 14

## 2022-04-09 MED ORDER — PALONOSETRON HCL INJECTION 0.25 MG/5ML
0.2500 mg | Freq: Once | INTRAVENOUS | Status: AC
  Administered 2022-04-09: 0.25 mg via INTRAVENOUS
  Filled 2022-04-09: qty 5

## 2022-04-09 MED ORDER — SODIUM CHLORIDE 0.9% FLUSH
10.0000 mL | INTRAVENOUS | Status: AC | PRN
  Administered 2022-04-09: 10 mL

## 2022-04-09 MED ORDER — SODIUM CHLORIDE 0.9 % IV SOLN
600.0000 mg | Freq: Once | INTRAVENOUS | Status: AC
  Administered 2022-04-09: 600 mg via INTRAVENOUS
  Filled 2022-04-09: qty 60

## 2022-04-09 MED ORDER — DIPHENHYDRAMINE HCL 50 MG/ML IJ SOLN
25.0000 mg | Freq: Once | INTRAMUSCULAR | Status: AC
  Administered 2022-04-09: 25 mg via INTRAVENOUS
  Filled 2022-04-09: qty 1

## 2022-04-09 MED ORDER — ACETAMINOPHEN 325 MG PO TABS
650.0000 mg | ORAL_TABLET | Freq: Once | ORAL | Status: AC
  Administered 2022-04-09: 650 mg via ORAL
  Filled 2022-04-09: qty 2

## 2022-04-09 MED ORDER — TRASTUZUMAB-DKST CHEMO 150 MG IV SOLR
6.0000 mg/kg | Freq: Once | INTRAVENOUS | Status: AC
  Administered 2022-04-09: 441 mg via INTRAVENOUS
  Filled 2022-04-09: qty 21

## 2022-04-09 NOTE — Assessment & Plan Note (Signed)
03/04/2022 History of subpectoral implants, palpable left breast mass by ultrasound 1.4 cm. Multiple additional masses 1.1 and 0.9 cm. 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 Breast MRI showed at least 7 different masses largest 1.6 cm. No lymphadenopathy  Treatment plan: 1. Neoadjuvant chemotherapy with TCH 6 cycles followed by Herceptin maintenance versus Kadcyla maintenance (based on response to neoadjuvant chemo) for 1 year 2. Followed bymastectomywith sentinel lymph node study 3. Followed by adjuvantantiestrogen therapy -------------------------------------------------------------------------------------------------------------------------------------------- Current treatment: Cycle 2 TCH(on 03/18/2022) Chemo Toxicities: 1. Chest wall rash: Fine maculopapular rash: She is taking Benadryl as needed for the itching 2. Bone pain due to Neulasta she is able to manage the pain with Tylenol 3. Taste changes 4. Burning urination: We will send for urine analysis today Return to clinic in 3 weeks for cycle 3

## 2022-04-09 NOTE — Progress Notes (Signed)
Met w/ pt in person to discuss copay assistance and the [employer].  Pt has possibly met her deductible so copay assistance shouldn't be needed but she's going to call [employer] to make sure.  If she needs copay assistance she will call me and I will apply online in her behalf.  I also informed her of the [employer], went over what it covers and gave her the income requirement.  She stated she exceeds that amount she doesn't qualify for the grant at this time.  She has my card for any questions or concerns she may have in the future.

## 2022-04-10 ENCOUNTER — Encounter (HOSPITAL_BASED_OUTPATIENT_CLINIC_OR_DEPARTMENT_OTHER): Payer: Self-pay

## 2022-04-10 ENCOUNTER — Ambulatory Visit (HOSPITAL_BASED_OUTPATIENT_CLINIC_OR_DEPARTMENT_OTHER): Payer: 59 | Admitting: Obstetrics & Gynecology

## 2022-04-11 ENCOUNTER — Inpatient Hospital Stay: Payer: 59 | Attending: Hematology and Oncology

## 2022-04-11 DIAGNOSIS — Z170 Estrogen receptor positive status [ER+]: Secondary | ICD-10-CM | POA: Diagnosis not present

## 2022-04-11 DIAGNOSIS — Z5112 Encounter for antineoplastic immunotherapy: Secondary | ICD-10-CM | POA: Diagnosis present

## 2022-04-11 DIAGNOSIS — Z5111 Encounter for antineoplastic chemotherapy: Secondary | ICD-10-CM | POA: Insufficient documentation

## 2022-04-11 DIAGNOSIS — Z5189 Encounter for other specified aftercare: Secondary | ICD-10-CM | POA: Insufficient documentation

## 2022-04-11 DIAGNOSIS — C50012 Malignant neoplasm of nipple and areola, left female breast: Secondary | ICD-10-CM | POA: Insufficient documentation

## 2022-04-11 MED ORDER — PEGFILGRASTIM-BMEZ 6 MG/0.6ML ~~LOC~~ SOSY
6.0000 mg | PREFILLED_SYRINGE | Freq: Once | SUBCUTANEOUS | Status: AC
  Administered 2022-04-11: 6 mg via SUBCUTANEOUS
  Filled 2022-04-11: qty 0.6

## 2022-04-15 ENCOUNTER — Encounter: Payer: Self-pay | Admitting: Hematology and Oncology

## 2022-04-15 NOTE — Progress Notes (Signed)
Patient Care Team: Donnajean Lopes, MD as PCP - General (Internal Medicine) Mauro Kaufmann, RN as Oncology Nurse Navigator Rockwell Germany, RN as Oncology Nurse Navigator Nicholas Lose, MD as Consulting Physician (Hematology and Oncology) Rolm Bookbinder, MD as Consulting Physician (General Surgery)  DIAGNOSIS:  Encounter Diagnosis  Name Primary?   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  Multiple additional masses 1.1 and 0.9 cm.  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  Breast MRI showed at least 7 different masses largest 1.6 cm.  No lymphadenopathy   03/17/2022 Genetic Testing   Negative genetic testing through the CancerNext performed at [facility] Surgery office.  The report date is Mar 17, 2022.  The CancerNext gene panel offered by [employer] includes sequencing and rearrangement analysis for the following 34 genes:   APC, ATM, BARD1, BMPR1A, BRCA1, BRCA2, BRIP1, CDH1, CDK4, CDKN2A, CHEK2, DICER1, HOXB13, EPCAM, GREM1, MLH1, MRE11A, MSH2, MSH6, MUTYH, NBN, NF1, PALB2, PMS2, POLD1, POLE, PTEN, RAD50, RAD51C, RAD51D, SMAD4, SMARCA4, STK11, and TP53.     03/18/2022 -  Chemotherapy   Patient is on Treatment Plan : BREAST Docetaxel + Carboplatin + Trastuzumab (TCH) q21d / Trastuzumab q21d       CHIEF COMPLIANT: Follow up cyle 3 TCH    INTERVAL HISTORY: [name] is a 41 year old with above-mentioned history of of breast cancer that is HER2 positive on [facility]. She presents to the clinic today for a follow-up. States that she had severe  fatigue, could not leave the house for 2  days. States that she had some mild diarrhea did not have to take anything for it. States that she had bone and pelvic pain. Complains of patches of hair lost. States that she still is working out whenever she can and feels good.   ALLERGIES:  has No Known Allergies.  MEDICATIONS:  Current Outpatient Medications  Medication Sig Dispense Refill   clonazePAM (KLONOPIN) 1 MG tablet Take 1 mg by mouth 2 (two) times daily as needed.     Dexamethasone (DECADRON PO) Take by mouth.     hydrOXYzine (ATARAX) 10 MG tablet Take 1 tablet (10 mg total) by mouth 3 (three) times daily as needed. 30 tablet 0   ondansetron (ZOFRAN) 8 MG tablet Take by mouth every 8 (eight) hours as needed for nausea or vomiting.     QSYMIA 7.5-46 MG CP24 Take 1 capsule by mouth daily.     traMADol (ULTRAM) 50 MG tablet Take 1 tablet (50 mg total) by mouth every 6 (six) hours as needed. 6 tablet 0   tranexamic acid (LYSTEDA) 650 MG TABS tablet Take 2 tablets (1,300 mg total) by mouth 3 (three) times daily. 30 tablet 1   No current facility-administered medications for this visit.    PHYSICAL EXAMINATION: ECOG PERFORMANCE STATUS: 1 - Symptomatic but completely ambulatory  Vitals:   04/29/22 0845  BP: (!) 141/75  Pulse: 70  Resp: 19  Temp: (!) 97.2 F (36.2 C)  SpO2: 100%   Filed Weights   04/29/22 0845  Weight: 158 lb 11.2 oz (72 kg)      LABORATORY DATA:  I have reviewed the data as listed    Latest Ref Rng & Units 04/09/2022    7:53 AM 03/25/2022    8:01 AM 03/17/2022   12:56 PM  CMP  Glucose 70 - 99 mg/dL 101  109  122   BUN 6 - 20 mg/dL _0 Creatinine 0.44 - 1.00 mg/dL 0.86  1.01  0.81   Sodium 135 - 145 mmol/L 139  136  136   Potassium 3.5 - 5.1 mmol/L 3.4  3.8  3.7   Chloride 98 - 111 mmol/L 105  103  104   CO2 22 - 32 mmol/L _1 Calcium 8.9 - 10.3 mg/dL 9.2  9.8  8.6   Total Protein 6.5 - 8.1 g/dL 6.8  7.0  5.7   Total Bilirubin 0.3 - 1.2 mg/dL 0.4  0.4  1.1   Alkaline Phos 38  - 126 U/L 59  57  32   AST 15 - 41 U/L _2 ALT 0 - 44 U/L _3 Lab Results  Component Value Date   WBC 10.6 (H) 04/29/2022   HGB 11.6 (L) 04/29/2022   HCT 32.8 (L) 04/29/2022   MCV 95.6 04/29/2022   PLT 196 04/29/2022   NEUTROABS 5.8 04/29/2022    ASSESSMENT & PLAN:  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  Multiple additional masses 1.1 and 0.9 cm.  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  Breast MRI showed at least 7 different masses largest 1.6 cm.  No lymphadenopathy   Treatment plan: 1. Neoadjuvant chemotherapy with TCH  6 cycles followed by Herceptin maintenance versus Kadcyla maintenance (based on response to neoadjuvant chemo) for 1 year 2. Followed by mastectomy with sentinel lymph node study 3. Followed by adjuvant antiestrogen therapy -------------------------------------------------------------------------------------------------------------------------------------------- Current treatment: Cycle 3 TCH (on 03/18/2022) Chemo Toxicities: Chest wall rash: Fine maculopapular rash: She is taking Benadryl as needed for the itching, sent a prescription for Atarax Bone pain due to Neulasta she is able to manage the pain with Tylenol, advised to take Motrin for couple of days if needed Taste changes Hair thinning: She is using dignicap.   She is contemplating on getting plastic surgery at [facility] with a plastic surgeon named Dr. Harle Stanford.  Patient is happy that the tumors are shrinking and she cannot feel them anymore.  Return to clinic in 3 weeks for cycle 4    No orders of the defined types were placed in this encounter.  The patient has a good understanding of the overall plan. she agrees with it. she will call with any problems that may develop before the  next visit here. Total time spent: 30 mins including face to face time and time spent for planning, charting and co-ordination of care   Harriette Ohara, MD 04/29/22    I Gardiner Coins am scribing for Dr. Lindi Adie  I have reviewed the above documentation for accuracy and completeness, and I agree with the above.

## 2022-04-28 MED FILL — Dexamethasone Sodium Phosphate Inj 100 MG/10ML: INTRAMUSCULAR | Qty: 1 | Status: AC

## 2022-04-28 MED FILL — Fosaprepitant Dimeglumine For IV Infusion 150 MG (Base Eq): INTRAVENOUS | Qty: 5 | Status: AC

## 2022-04-29 ENCOUNTER — Inpatient Hospital Stay: Payer: 59

## 2022-04-29 ENCOUNTER — Other Ambulatory Visit: Payer: Self-pay

## 2022-04-29 ENCOUNTER — Inpatient Hospital Stay (HOSPITAL_BASED_OUTPATIENT_CLINIC_OR_DEPARTMENT_OTHER): Payer: 59 | Admitting: Hematology and Oncology

## 2022-04-29 DIAGNOSIS — Z170 Estrogen receptor positive status [ER+]: Secondary | ICD-10-CM

## 2022-04-29 DIAGNOSIS — Z95828 Presence of other vascular implants and grafts: Secondary | ICD-10-CM

## 2022-04-29 DIAGNOSIS — C50012 Malignant neoplasm of nipple and areola, left female breast: Secondary | ICD-10-CM

## 2022-04-29 DIAGNOSIS — Z5112 Encounter for antineoplastic immunotherapy: Secondary | ICD-10-CM | POA: Diagnosis not present

## 2022-04-29 LAB — CBC WITH DIFFERENTIAL (CANCER CENTER ONLY)
Abs Immature Granulocytes: 0.05 10*3/uL (ref 0.00–0.07)
Basophils Absolute: 0.1 10*3/uL (ref 0.0–0.1)
Basophils Relative: 1 %
Eosinophils Absolute: 0 10*3/uL (ref 0.0–0.5)
Eosinophils Relative: 0 %
HCT: 32.8 % — ABNORMAL LOW (ref 36.0–46.0)
Hemoglobin: 11.6 g/dL — ABNORMAL LOW (ref 12.0–15.0)
Immature Granulocytes: 1 %
Lymphocytes Relative: 35 %
Lymphs Abs: 3.7 10*3/uL (ref 0.7–4.0)
MCH: 33.8 pg (ref 26.0–34.0)
MCHC: 35.4 g/dL (ref 30.0–36.0)
MCV: 95.6 fL (ref 80.0–100.0)
Monocytes Absolute: 1 10*3/uL (ref 0.1–1.0)
Monocytes Relative: 10 %
Neutro Abs: 5.8 10*3/uL (ref 1.7–7.7)
Neutrophils Relative %: 53 %
Platelet Count: 196 10*3/uL (ref 150–400)
RBC: 3.43 MIL/uL — ABNORMAL LOW (ref 3.87–5.11)
RDW: 15.1 % (ref 11.5–15.5)
WBC Count: 10.6 10*3/uL — ABNORMAL HIGH (ref 4.0–10.5)
nRBC: 0 % (ref 0.0–0.2)

## 2022-04-29 LAB — CMP (CANCER CENTER ONLY)
ALT: 28 U/L (ref 0–44)
AST: 19 U/L (ref 15–41)
Albumin: 4.2 g/dL (ref 3.5–5.0)
Alkaline Phosphatase: 71 U/L (ref 38–126)
Anion gap: 6 (ref 5–15)
BUN: 20 mg/dL (ref 6–20)
CO2: 28 mmol/L (ref 22–32)
Calcium: 9.8 mg/dL (ref 8.9–10.3)
Chloride: 104 mmol/L (ref 98–111)
Creatinine: 0.86 mg/dL (ref 0.44–1.00)
GFR, Estimated: >60 mL/min (ref >60)
Glucose, Bld: 108 mg/dL — ABNORMAL HIGH (ref 70–99)
Potassium: 3.5 mmol/L (ref 3.5–5.1)
Sodium: 138 mmol/L (ref 135–145)
Total Bilirubin: 0.7 mg/dL (ref 0.3–1.2)
Total Protein: 7 g/dL (ref 6.5–8.1)

## 2022-04-29 MED ORDER — SODIUM CHLORIDE 0.9 % IV SOLN: Freq: Once | INTRAVENOUS | Status: AC

## 2022-04-29 MED ORDER — SODIUM CHLORIDE 0.9 % IV SOLN
10.0000 mg | Freq: Once | INTRAVENOUS | Status: AC
  Administered 2022-04-29: 10 mg via INTRAVENOUS
  Filled 2022-04-29: qty 10

## 2022-04-29 MED ORDER — SODIUM CHLORIDE 0.9 % IV SOLN
150.0000 mg | Freq: Once | INTRAVENOUS | Status: AC
  Administered 2022-04-29: 150 mg via INTRAVENOUS
  Filled 2022-04-29: qty 150

## 2022-04-29 MED ORDER — ACETAMINOPHEN 325 MG PO TABS
650.0000 mg | ORAL_TABLET | Freq: Once | ORAL | Status: AC
  Administered 2022-04-29: 650 mg via ORAL
  Filled 2022-04-29: qty 2

## 2022-04-29 MED ORDER — HEPARIN SOD (PORK) LOCK FLUSH 100 UNIT/ML IV SOLN
500.0000 [IU] | Freq: Once | INTRAVENOUS | Status: AC | PRN
  Administered 2022-04-29: 500 [IU]

## 2022-04-29 MED ORDER — SODIUM CHLORIDE 0.9 % IV SOLN
75.0000 mg/m2 | Freq: Once | INTRAVENOUS | Status: AC
  Administered 2022-04-29: 140 mg via INTRAVENOUS
  Filled 2022-04-29: qty 14

## 2022-04-29 MED ORDER — SODIUM CHLORIDE 0.9% FLUSH
10.0000 mL | INTRAVENOUS | Status: DC | PRN
  Administered 2022-04-29: 10 mL

## 2022-04-29 MED ORDER — PALONOSETRON HCL INJECTION 0.25 MG/5ML
0.2500 mg | Freq: Once | INTRAVENOUS | Status: AC
  Administered 2022-04-29: 0.25 mg via INTRAVENOUS
  Filled 2022-04-29: qty 5

## 2022-04-29 MED ORDER — SODIUM CHLORIDE 0.9 % IV SOLN
600.0000 mg | Freq: Once | INTRAVENOUS | Status: AC
  Administered 2022-04-29: 600 mg via INTRAVENOUS
  Filled 2022-04-29: qty 60

## 2022-04-29 MED ORDER — DIPHENHYDRAMINE HCL 50 MG/ML IJ SOLN
25.0000 mg | Freq: Once | INTRAMUSCULAR | Status: AC
  Administered 2022-04-29: 25 mg via INTRAVENOUS
  Filled 2022-04-29: qty 1

## 2022-04-29 MED ORDER — SODIUM CHLORIDE 0.9% FLUSH: 10.0000 mL | Freq: Once | INTRAVENOUS | Status: DC

## 2022-04-29 MED ORDER — TRASTUZUMAB-DKST CHEMO 150 MG IV SOLR
6.0000 mg/kg | Freq: Once | INTRAVENOUS | Status: AC
  Administered 2022-04-29: 441 mg via INTRAVENOUS
  Filled 2022-04-29: qty 20

## 2022-04-29 NOTE — Patient Instructions (Signed)
[facility] ONCOL[facility]   Discharge Instructions: Thank you for choosing [facility] to provide your oncology and hematology care.   If you have a lab appointment with the [facility], please go directly to the [facility] and check in at the registration area.   Wear comfortable clothing and clothing appropriate for easy access to any Portacath or PICC line.   We strive to give you quality time with your provider. You may need to reschedule your appointment if you arrive late (15 or more minutes).  Arriving late affects you and other patients whose appointments are after yours.  Also, if you miss three or more appointments without notifying the office, you may be dismissed from the clinic at the provider's discretion.      For prescription refill requests, have your pharmacy contact our office and allow 72 hours for refills to be completed.    Today you received the following chemotherapy and/or immunotherapy agents: trastuzumab, docetaxel, and carboplatin      To help prevent nausea and vomiting after your treatment, we encourage you to take your nausea medication as directed.  BELOW ARE SYMPTOMS THAT SHOULD BE REPORTED IMMEDIATELY: *FEVER GREATER THAN 100.4 F (38 C) OR HIGHER *CHILLS OR SWEATING *NAUSEA AND VOMITING THAT IS NOT CONTROLLED WITH YOUR NAUSEA MEDICATION *UNUSUAL SHORTNESS OF BREATH *UNUSUAL BRUISING OR BLEEDING *URINARY PROBLEMS (pain or burning when urinating, or frequent urination) *BOWEL PROBLEMS (unusual diarrhea, constipation, pain near the anus) TENDERNESS IN MOUTH AND THROAT WITH OR WITHOUT PRESENCE OF ULCERS (sore throat, sores in mouth, or a toothache) UNUSUAL RASH, SWELLING OR PAIN  UNUSUAL VAGINAL DISCHARGE OR ITCHING   Items with * indicate a potential emergency and should be followed up as soon as possible or go to the Emergency Department if any problems should occur.  Please show the CHEMOTHERAPY ALERT CARD or  IMMUNOTHERAPY ALERT CARD at check-in to the Emergency Department and triage nurse.  Should you have questions after your visit or need to cancel or reschedule your appointment, please contact [facility]  Dept: [phone]  and follow the prompts.  Office hours are 8:00 a.m. to 4:30 p.m. Monday - Friday. Please note that voicemails left after 4:00 p.m. may not be returned until the following business day.  We are closed weekends and major holidays. You have access to a nurse at all times for urgent questions. Please call the main number to the clinic Dept: [phone] and follow the prompts.   For any non-urgent questions, you may also contact your provider using MyChart. We now offer e-Visits for anyone 51 and older to request care online for non-urgent symptoms. For details visit [email or web address].   Also download the MyChart app! Go to the app store, search "MyChart", open the app, select [facility], and log in with your MyChart username and password.  Masks are optional in the cancer centers. If you would like for your care team to wear a mask while they are taking care of you, please let them know. For doctor visits, patients may have with them one support person who is at least 42 years old. At this time, visitors are not allowed in the infusion area.

## 2022-04-29 NOTE — Assessment & Plan Note (Addendum)
03/04/2022 History of subpectoral implants, palpable left breast mass by ultrasound 1.4 cm. Multiple additional masses 1.1 and 0.9 cm. 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 Breast MRI showed at least 7 different masses largest 1.6 cm. No lymphadenopathy  Treatment plan: 1. Neoadjuvant chemotherapy with TCH 6 cycles followed by Herceptin maintenance versus Kadcyla maintenance (based on response to neoadjuvant chemo) for 1 year 2. Followed bymastectomywith sentinel lymph node study 3. Followed by adjuvantantiestrogen therapy -------------------------------------------------------------------------------------------------------------------------------------------- Current treatment: Cycle 3TCH(on 03/18/2022) Chemo Toxicities: 1. Chest wall rash: Fine maculopapular rash: She is taking Benadryl as needed for the itching, sent a prescription for Atarax 2. Bone pain due to Neulasta she is able to manage the pain with Tylenol, advised to take Motrin for couple of days if needed 3. Taste changes 4. Hair thinning: She is using dignicap.  She is contemplating on getting plastic surgery at [facility] with a plastic surgeon named Dr. Harle Stanford.  Return to clinic in 3 weeks for cycle 4

## 2022-05-01 ENCOUNTER — Other Ambulatory Visit: Payer: Self-pay

## 2022-05-01 ENCOUNTER — Inpatient Hospital Stay: Payer: 59

## 2022-05-01 VITALS — BP 117/68 | HR 62 | Temp 98.2°F | Resp 16

## 2022-05-01 DIAGNOSIS — Z5112 Encounter for antineoplastic immunotherapy: Secondary | ICD-10-CM | POA: Diagnosis not present

## 2022-05-01 DIAGNOSIS — C50012 Malignant neoplasm of nipple and areola, left female breast: Secondary | ICD-10-CM

## 2022-05-01 MED ORDER — PEGFILGRASTIM-BMEZ 6 MG/0.6ML ~~LOC~~ SOSY
6.0000 mg | PREFILLED_SYRINGE | Freq: Once | SUBCUTANEOUS | Status: AC
  Administered 2022-05-01: 6 mg via SUBCUTANEOUS
  Filled 2022-05-01: qty 0.6

## 2022-05-02 ENCOUNTER — Telehealth: Payer: Self-pay | Admitting: Hematology and Oncology

## 2022-05-02 ENCOUNTER — Inpatient Hospital Stay: Payer: 59

## 2022-05-19 ENCOUNTER — Inpatient Hospital Stay: Payer: 59

## 2022-05-19 ENCOUNTER — Inpatient Hospital Stay: Payer: 59 | Admitting: Hematology and Oncology

## 2022-05-19 MED FILL — Fosaprepitant Dimeglumine For IV Infusion 150 MG (Base Eq): INTRAVENOUS | Qty: 5 | Status: AC

## 2022-05-19 MED FILL — Dexamethasone Sodium Phosphate Inj 100 MG/10ML: INTRAMUSCULAR | Qty: 1 | Status: AC

## 2022-05-20 ENCOUNTER — Inpatient Hospital Stay: Payer: 59

## 2022-05-20 ENCOUNTER — Inpatient Hospital Stay (HOSPITAL_BASED_OUTPATIENT_CLINIC_OR_DEPARTMENT_OTHER): Payer: 59 | Admitting: Hematology and Oncology

## 2022-05-20 ENCOUNTER — Other Ambulatory Visit: Payer: Self-pay

## 2022-05-20 ENCOUNTER — Inpatient Hospital Stay: Payer: 59 | Attending: Hematology and Oncology

## 2022-05-20 DIAGNOSIS — Z5112 Encounter for antineoplastic immunotherapy: Secondary | ICD-10-CM | POA: Diagnosis present

## 2022-05-20 DIAGNOSIS — Z5189 Encounter for other specified aftercare: Secondary | ICD-10-CM | POA: Insufficient documentation

## 2022-05-20 DIAGNOSIS — C50012 Malignant neoplasm of nipple and areola, left female breast: Secondary | ICD-10-CM | POA: Insufficient documentation

## 2022-05-20 DIAGNOSIS — Z5111 Encounter for antineoplastic chemotherapy: Secondary | ICD-10-CM | POA: Diagnosis not present

## 2022-05-20 DIAGNOSIS — Z95828 Presence of other vascular implants and grafts: Secondary | ICD-10-CM

## 2022-05-20 DIAGNOSIS — Z170 Estrogen receptor positive status [ER+]: Secondary | ICD-10-CM

## 2022-05-20 LAB — CBC WITH DIFFERENTIAL/PLATELET
Abs Immature Granulocytes: 0.02 10*3/uL (ref 0.00–0.07)
Basophils Absolute: 0.1 10*3/uL (ref 0.0–0.1)
Basophils Relative: 1 %
Eosinophils Absolute: 0 10*3/uL (ref 0.0–0.5)
Eosinophils Relative: 0 %
HCT: 32.2 % — ABNORMAL LOW (ref 36.0–46.0)
Hemoglobin: 11.2 g/dL — ABNORMAL LOW (ref 12.0–15.0)
Immature Granulocytes: 0 %
Lymphocytes Relative: 43 %
Lymphs Abs: 3.2 10*3/uL (ref 0.7–4.0)
MCH: 33.8 pg (ref 26.0–34.0)
MCHC: 34.8 g/dL (ref 30.0–36.0)
MCV: 97.3 fL (ref 80.0–100.0)
Monocytes Absolute: 0.7 10*3/uL (ref 0.1–1.0)
Monocytes Relative: 10 %
Neutro Abs: 3.5 10*3/uL (ref 1.7–7.7)
Neutrophils Relative %: 46 %
Platelets: 211 10*3/uL (ref 150–400)
RBC: 3.31 MIL/uL — ABNORMAL LOW (ref 3.87–5.11)
RDW: 16.4 % — ABNORMAL HIGH (ref 11.5–15.5)
WBC: 7.6 10*3/uL (ref 4.0–10.5)
nRBC: 0 % (ref 0.0–0.2)

## 2022-05-20 LAB — CMP (CANCER CENTER ONLY)
ALT: 29 U/L (ref 0–44)
AST: 22 U/L (ref 15–41)
Albumin: 4.3 g/dL (ref 3.5–5.0)
Alkaline Phosphatase: 62 U/L (ref 38–126)
Anion gap: 7 (ref 5–15)
BUN: 18 mg/dL (ref 6–20)
CO2: 27 mmol/L (ref 22–32)
Calcium: 9.7 mg/dL (ref 8.9–10.3)
Chloride: 105 mmol/L (ref 98–111)
Creatinine: 0.87 mg/dL (ref 0.44–1.00)
GFR, Estimated: >60 mL/min (ref >60)
Glucose, Bld: 122 mg/dL — ABNORMAL HIGH (ref 70–99)
Potassium: 3.1 mmol/L — ABNORMAL LOW (ref 3.5–5.1)
Sodium: 139 mmol/L (ref 135–145)
Total Bilirubin: 0.9 mg/dL (ref 0.3–1.2)
Total Protein: 7.1 g/dL (ref 6.5–8.1)

## 2022-05-20 MED ORDER — PALONOSETRON HCL INJECTION 0.25 MG/5ML
0.2500 mg | Freq: Once | INTRAVENOUS | Status: AC
  Administered 2022-05-20: 0.25 mg via INTRAVENOUS
  Filled 2022-05-20: qty 5

## 2022-05-20 MED ORDER — SODIUM CHLORIDE 0.9% FLUSH
10.0000 mL | INTRAVENOUS | Status: DC | PRN
  Administered 2022-05-20: 10 mL

## 2022-05-20 MED ORDER — SODIUM CHLORIDE 0.9 % IV SOLN
10.0000 mg | Freq: Once | INTRAVENOUS | Status: AC
  Administered 2022-05-20: 10 mg via INTRAVENOUS
  Filled 2022-05-20: qty 10

## 2022-05-20 MED ORDER — DIPHENHYDRAMINE HCL 50 MG/ML IJ SOLN
25.0000 mg | Freq: Once | INTRAMUSCULAR | Status: AC
  Administered 2022-05-20: 25 mg via INTRAVENOUS
  Filled 2022-05-20: qty 1

## 2022-05-20 MED ORDER — SODIUM CHLORIDE 0.9% FLUSH
10.0000 mL | Freq: Once | INTRAVENOUS | Status: AC
  Administered 2022-05-20: 10 mL

## 2022-05-20 MED ORDER — SODIUM CHLORIDE 0.9 % IV SOLN
75.0000 mg/m2 | Freq: Once | INTRAVENOUS | Status: AC
  Administered 2022-05-20: 140 mg via INTRAVENOUS
  Filled 2022-05-20: qty 14

## 2022-05-20 MED ORDER — TRASTUZUMAB-DKST CHEMO 150 MG IV SOLR
6.0000 mg/kg | Freq: Once | INTRAVENOUS | Status: AC
  Administered 2022-05-20: 441 mg via INTRAVENOUS
  Filled 2022-05-20: qty 21

## 2022-05-20 MED ORDER — SODIUM CHLORIDE 0.9 % IV SOLN: Freq: Once | INTRAVENOUS | Status: AC

## 2022-05-20 MED ORDER — ACETAMINOPHEN 325 MG PO TABS
650.0000 mg | ORAL_TABLET | Freq: Once | ORAL | Status: AC
  Administered 2022-05-20: 650 mg via ORAL
  Filled 2022-05-20: qty 2

## 2022-05-20 MED ORDER — SODIUM CHLORIDE 0.9 % IV SOLN
600.0000 mg | Freq: Once | INTRAVENOUS | Status: AC
  Administered 2022-05-20: 600 mg via INTRAVENOUS
  Filled 2022-05-20: qty 60

## 2022-05-20 MED ORDER — SODIUM CHLORIDE 0.9 % IV SOLN
150.0000 mg | Freq: Once | INTRAVENOUS | Status: AC
  Administered 2022-05-20: 150 mg via INTRAVENOUS
  Filled 2022-05-20: qty 150

## 2022-05-20 MED ORDER — HEPARIN SOD (PORK) LOCK FLUSH 100 UNIT/ML IV SOLN
500.0000 [IU] | Freq: Once | INTRAVENOUS | Status: AC | PRN
  Administered 2022-05-20: 500 [IU]

## 2022-05-20 NOTE — Patient Instructions (Signed)
[facility] ONCOL[facility]  Discharge Instructions: Thank you for choosing [facility] to provide your oncology and hematology care.   If you have a lab appointment with the [facility], please go directly to the [facility] and check in at the registration area.   Wear comfortable clothing and clothing appropriate for easy access to any Portacath or PICC line.   We strive to give you quality time with your provider. You may need to reschedule your appointment if you arrive late (15 or more minutes).  Arriving late affects you and other patients whose appointments are after yours.  Also, if you miss three or more appointments without notifying the office, you may be dismissed from the clinic at the provider's discretion.      For prescription refill requests, have your pharmacy contact our office and allow 72 hours for refills to be completed.    Today you received the following chemotherapy and/or immunotherapy agents Ogivri, Taxotere, Carboplatin      To help prevent nausea and vomiting after your treatment, we encourage you to take your nausea medication as directed.  BELOW ARE SYMPTOMS THAT SHOULD BE REPORTED IMMEDIATELY: *FEVER GREATER THAN 100.4 F (38 C) OR HIGHER *CHILLS OR SWEATING *NAUSEA AND VOMITING THAT IS NOT CONTROLLED WITH YOUR NAUSEA MEDICATION *UNUSUAL SHORTNESS OF BREATH *UNUSUAL BRUISING OR BLEEDING *URINARY PROBLEMS (pain or burning when urinating, or frequent urination) *BOWEL PROBLEMS (unusual diarrhea, constipation, pain near the anus) TENDERNESS IN MOUTH AND THROAT WITH OR WITHOUT PRESENCE OF ULCERS (sore throat, sores in mouth, or a toothache) UNUSUAL RASH, SWELLING OR PAIN  UNUSUAL VAGINAL DISCHARGE OR ITCHING   Items with * indicate a potential emergency and should be followed up as soon as possible or go to the Emergency Department if any problems should occur.  Please show the CHEMOTHERAPY ALERT CARD or IMMUNOTHERAPY ALERT  CARD at check-in to the Emergency Department and triage nurse.  Should you have questions after your visit or need to cancel or reschedule your appointment, please contact [facility]  Dept: [phone]  and follow the prompts.  Office hours are 8:00 a.m. to 4:30 p.m. Monday - Friday. Please note that voicemails left after 4:00 p.m. may not be returned until the following business day.  We are closed weekends and major holidays. You have access to a nurse at all times for urgent questions. Please call the main number to the clinic Dept: [phone] and follow the prompts.   For any non-urgent questions, you may also contact your provider using MyChart. We now offer e-Visits for anyone 38 and older to request care online for non-urgent symptoms. For details visit [email or web address].   Also download the MyChart app! Go to the app store, search "MyChart", open the app, select [facility], and log in with your MyChart username and password.  Masks are optional in the cancer centers. If you would like for your care team to wear a mask while they are taking care of you, please let them know. For doctor visits, patients may have with them one support person who is at least 42 years old. At this time, visitors are not allowed in the infusion area.

## 2022-05-20 NOTE — Assessment & Plan Note (Addendum)
03/04/2022 History of subpectoral implants, palpable left breast mass by ultrasound 1.4 cm. Multiple additional masses 1.1 and 0.9 cm. 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 Breast MRI showed at least 7 different masses largest 1.6 cm. No lymphadenopathy  Treatment plan: 1. Neoadjuvant chemotherapy with TCH 6 cycles followed by Herceptin maintenance versus Kadcyla maintenance (based on response to neoadjuvant chemo) for 1 year 2. Followed bymastectomywith sentinel lymph node study 3. Followed by adjuvantantiestrogen therapy -------------------------------------------------------------------------------------------------------------------------------------------- Current treatment: Cycle4TCH(on 03/18/2022) Chemo Toxicities: 1. Rash: Fine maculopapular rash: She is taking Benadryl as needed for the itching  2. Bone pain due to Neulasta she is able to manage the pain with Tylenol, advised to take Motrin for couple of days if needed 3. Taste changes 4. Hair thinning: She is using dignicap.  She is contemplating on getting plastic surgery at [facility] with a plastic surgeon named Dr. Harle Stanford.  Patient is happy that the tumors are shrinking and she cannot feel them anymore. Surgery date has been set for October  Return to clinic in3weeks for cycle 5

## 2022-05-20 NOTE — Progress Notes (Signed)
Patient Care Team: Donnajean Lopes, MD as PCP - General (Internal Medicine) Mauro Kaufmann, RN as Oncology Nurse Navigator Rockwell Germany, RN as Oncology Nurse Navigator Nicholas Lose, MD as Consulting Physician (Hematology and Oncology) Rolm Bookbinder, MD as Consulting Physician (General Surgery)  DIAGNOSIS:  Encounter Diagnosis  Name Primary?   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  Multiple additional masses 1.1 and 0.9 cm.  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  Breast MRI showed at least 7 different masses largest 1.6 cm.  No lymphadenopathy   03/17/2022 Genetic Testing   Negative genetic testing through the CancerNext performed at [facility] Surgery office.  The report date is Mar 17, 2022.  The CancerNext gene panel offered by [employer] includes sequencing and rearrangement analysis for the following 34 genes:   APC, ATM, BARD1, BMPR1A, BRCA1, BRCA2, BRIP1, CDH1, CDK4, CDKN2A, CHEK2, DICER1, HOXB13, EPCAM, GREM1, MLH1, MRE11A, MSH2, MSH6, MUTYH, NBN, NF1, PALB2, PMS2, POLD1, POLE, PTEN, RAD50, RAD51C, RAD51D, SMAD4, SMARCA4, STK11, and TP53.     03/18/2022 -  Chemotherapy   Patient is on Treatment Plan : BREAST Docetaxel + Carboplatin + Trastuzumab (TCH) q21d / Trastuzumab q21d       CHIEF COMPLIANT: Follow up cyle 4 TCH  INTERVAL HISTORY: [name] is a 41 year old with above-mentioned history of of breast cancer that is HER2 positive on [facility]. She presents to the clinic today for a follow-up. States that she had some fatigue, manageable bone pain and complains of a  rash on her back. Hot flashes are terrible, they wake her up 2-3 times a night. Appetite has gotten better. 1-2 manageable diarrhea. Tingling in the tips of fingers in the left hand. Denies nausea. She has been working out. Some bruising but nothing terrible. Complains of some headaches right after treatment. Nothing terrible but noticeable.   ALLERGIES:  has No Known Allergies.  MEDICATIONS:  Current Outpatient Medications  Medication Sig Dispense Refill   Dexamethasone (DECADRON PO) Take by mouth.     hydrOXYzine (ATARAX) 10 MG tablet Take 1 tablet (10 mg total) by mouth 3 (three) times daily as needed. 30 tablet 0   ondansetron (ZOFRAN) 8 MG tablet Take by mouth every 8 (eight) hours as needed for nausea or vomiting.     tranexamic acid (LYSTEDA) 650 MG TABS tablet Take 2 tablets (1,300 mg total) by mouth 3 (three) times daily. 30 tablet 1   No current facility-administered medications for this visit.    PHYSICAL EXAMINATION: ECOG PERFORMANCE STATUS: 1 - Symptomatic but completely ambulatory  Vitals:   05/20/22 0920  BP: 122/72  Pulse: 68  Resp: 18  Temp: (!) 97.3 F (36.3 C)  SpO2: 99%   Filed Weights   05/20/22 0920  Weight: 161 lb 6.4 oz (73.2 kg)      LABORATORY DATA:  I have reviewed the data as listed    Latest Ref Rng & Units 05/20/2022    9:03 AM 04/29/2022    8:38 AM 04/09/2022    7:53 AM  CMP  Glucose 70 - 99 mg/dL 122  108  101   BUN  6 - 20 mg/dL _0 Creatinine 0.44 - 1.00 mg/dL 0.87  0.86  0.86   Sodium 135 - 145 mmol/L 139  138  139   Potassium 3.5 - 5.1 mmol/L 3.1  3.5  3.4   Chloride 98 - 111 mmol/L 105  104  105   CO2 22 - 32 mmol/L _1 Calcium 8.9 - 10.3 mg/dL 9.7  9.8  9.2   Total Protein 6.5 - 8.1 g/dL 7.1  7.0  6.8   Total Bilirubin 0.3 - 1.2 mg/dL 0.9  0.7  0.4   Alkaline Phos 38 - 126 U/L 62  71  59   AST 15 - 41 U/L _2 ALT 0 - 44 U/L _3 Lab Results  Component Value Date   WBC 7.6 05/20/2022    HGB 11.2 (L) 05/20/2022   HCT 32.2 (L) 05/20/2022   MCV 97.3 05/20/2022   PLT 211 05/20/2022   NEUTROABS 3.5 05/20/2022    ASSESSMENT & PLAN:  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  Multiple additional masses 1.1 and 0.9 cm.  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  Breast MRI showed at least 7 different masses largest 1.6 cm.  No lymphadenopathy   Treatment plan: 1. Neoadjuvant chemotherapy with TCH  6 cycles followed by Herceptin maintenance versus Kadcyla maintenance (based on response to neoadjuvant chemo) for 1 year 2. Followed by mastectomy with sentinel lymph node study 3. Followed by adjuvant antiestrogen therapy -------------------------------------------------------------------------------------------------------------------------------------------- Current treatment: Cycle 4 TCH (on 03/18/2022) Chemo Toxicities: Rash: Fine maculopapular rash: She is taking Benadryl as needed for the itching  Bone pain due to Neulasta she is able to manage the pain with Tylenol, advised to take Motrin for couple of days if needed Taste changes Hair thinning: She is using dignicap.   She is contemplating on getting plastic surgery at [facility] with a plastic surgeon named Dr. Harle Stanford.  Patient is happy that the tumors are shrinking and she cannot feel them anymore. Surgery date has been set for October   Return to clinic in 3 weeks for cycle 5    No orders of the defined types were placed in this encounter.  The patient has a good understanding of the overall plan. she agrees with it. she will call with any problems that may develop before the next visit here. Total time spent: 30 mins including face to face time and time spent for planning, charting and co-ordination of care   Harriette Ohara, MD 05/20/22    I Gardiner Coins am scribing for Dr. Lindi Adie  I have reviewed the above documentation for accuracy and completeness, and I agree with the above. Marland Kitchen

## 2022-05-21 ENCOUNTER — Telehealth: Payer: Self-pay | Admitting: Hematology and Oncology

## 2022-05-21 NOTE — Telephone Encounter (Signed)
Scheduled appointment per 7/11 los. Left voicemail.

## 2022-05-22 ENCOUNTER — Other Ambulatory Visit: Payer: Self-pay

## 2022-05-22 ENCOUNTER — Inpatient Hospital Stay: Payer: 59

## 2022-05-22 VITALS — BP 113/73 | HR 61 | Temp 97.9°F | Resp 18

## 2022-05-22 DIAGNOSIS — Z5111 Encounter for antineoplastic chemotherapy: Secondary | ICD-10-CM | POA: Diagnosis not present

## 2022-05-22 DIAGNOSIS — C50012 Malignant neoplasm of nipple and areola, left female breast: Secondary | ICD-10-CM

## 2022-05-22 MED ORDER — PEGFILGRASTIM-BMEZ 6 MG/0.6ML ~~LOC~~ SOSY
6.0000 mg | PREFILLED_SYRINGE | Freq: Once | SUBCUTANEOUS | Status: AC
  Administered 2022-05-22: 6 mg via SUBCUTANEOUS
  Filled 2022-05-22: qty 0.6

## 2022-05-22 NOTE — Patient Instructions (Signed)

## 2022-06-02 ENCOUNTER — Other Ambulatory Visit: Payer: Self-pay

## 2022-06-06 NOTE — Progress Notes (Unsigned)
Patient Care Team: Emily Lopes, MD as PCP - General (Internal Medicine) Mauro Kaufmann, RN as Oncology Nurse Navigator Rockwell Germany, RN as Oncology Nurse Navigator Nicholas Lose, MD as Consulting Physician (Hematology and Oncology) Rolm Bookbinder, MD as Consulting Physician (General Surgery)  DIAGNOSIS: No diagnosis found.  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  Multiple additional masses 1.1 and 0.9 cm.  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  Breast MRI showed at least 7 different masses largest 1.6 cm.  No lymphadenopathy   03/17/2022 Genetic Testing   Negative genetic testing through the CancerNext performed at [facility] Surgery office.  The report date is Mar 17, 2022.  The CancerNext gene panel offered by [employer] includes sequencing and rearrangement analysis for the following 34 genes:   APC, ATM, BARD1, BMPR1A, BRCA1, BRCA2, BRIP1, CDH1, CDK4, CDKN2A, CHEK2, DICER1, HOXB13, EPCAM, GREM1, MLH1, MRE11A, MSH2, MSH6, MUTYH, NBN, NF1, PALB2, PMS2, POLD1, POLE, PTEN, RAD50, RAD51C, RAD51D, SMAD4, SMARCA4, STK11, and TP53.     03/18/2022 -  Chemotherapy   Patient is on Treatment Plan : BREAST Docetaxel + Carboplatin + Trastuzumab (TCH) q21d / Trastuzumab q21d       CHIEF COMPLIANT:   INTERVAL HISTORY: [name] is a   ALLERGIES:  has No Known Allergies.  MEDICATIONS:  Current Outpatient Medications  Medication Sig Dispense Refill   Dexamethasone (DECADRON PO) Take by mouth.     hydrOXYzine (ATARAX) 10 MG tablet Take 1 tablet (10 mg total) by mouth 3 (three) times daily as needed. 30 tablet 0   ondansetron (ZOFRAN) 8 MG tablet Take by mouth every 8 (eight) hours as needed  for nausea or vomiting.     tranexamic acid (LYSTEDA) 650 MG TABS tablet Take 2 tablets (1,300 mg total) by mouth 3 (three) times daily. 30 tablet 1   No current facility-administered medications for this visit.    PHYSICAL EXAMINATION: ECOG PERFORMANCE STATUS: {CHL ONC ECOG PS:[id number]}  There were no vitals filed for this visit. There were no vitals filed for this visit.  BREAST:*** No palpable masses or nodules in either right or left breasts. No palpable axillary supraclavicular or infraclavicular adenopathy no breast tenderness or nipple discharge. (exam performed in the presence of a chaperone)  LABORATORY DATA:  I have reviewed the data as listed    Latest Ref Rng & Units 05/20/2022    9:03 AM 04/29/2022    8:38 AM 04/09/2022    7:53 AM  CMP  Glucose 70 - 99 mg/dL 122  108  101   BUN 6 - 20 mg/dL '18  20  14   ' Creatinine 0.44 - 1.00 mg/dL 0.87  0.86  0.86   Sodium 135 - 145 mmol/L 139  138  139   Potassium 3.5 - 5.1 mmol/L 3.1  3.5  3.4   Chloride 98 - 111 mmol/L 105  104  105   CO2 22 - 32 mmol/L '27  28  27   ' Calcium 8.9 - 10.3 mg/dL 9.7  9.8  9.2   Total Protein 6.5 - 8.1 g/dL 7.1  7.0  6.8   Total Bilirubin 0.3 - 1.2 mg/dL 0.9  0.7  0.4   Alkaline Phos 38 - 126 U/L 62  71  59   AST 15 - 41 U/L '22  19  21   ' ALT 0 - 44 U/L '29  28  27     ' Lab Results  Component Value Date   WBC 7.6 05/20/2022   HGB 11.2 (L) 05/20/2022   HCT 32.2 (L) 05/20/2022   MCV 97.3 05/20/2022   PLT 211 05/20/2022   NEUTROABS 3.5 05/20/2022    ASSESSMENT & PLAN:  No problem-specific Assessment & Plan notes found for this encounter.    No orders of the defined types were placed in this encounter.  The patient has a good understanding of the overall plan. she agrees with it. she will call with any problems that may develop before the next visit here. Total time spent: 30 mins including face to face time and time spent for planning, charting and co-ordination of care   Suzzette Righter, [place] 06/06/22    I Gardiner Coins am scribing for Dr. Lindi Adie  ***

## 2022-06-09 MED FILL — Dexamethasone Sodium Phosphate Inj 100 MG/10ML: INTRAMUSCULAR | Qty: 1 | Status: AC

## 2022-06-09 MED FILL — Fosaprepitant Dimeglumine For IV Infusion 150 MG (Base Eq): INTRAVENOUS | Qty: 5 | Status: AC

## 2022-06-10 ENCOUNTER — Inpatient Hospital Stay: Payer: 59

## 2022-06-10 ENCOUNTER — Inpatient Hospital Stay: Payer: 59 | Attending: Hematology and Oncology | Admitting: Hematology and Oncology

## 2022-06-10 ENCOUNTER — Encounter: Payer: Self-pay | Admitting: *Deleted

## 2022-06-10 DIAGNOSIS — C50012 Malignant neoplasm of nipple and areola, left female breast: Secondary | ICD-10-CM

## 2022-06-10 DIAGNOSIS — Z170 Estrogen receptor positive status [ER+]: Secondary | ICD-10-CM | POA: Insufficient documentation

## 2022-06-10 DIAGNOSIS — Z5111 Encounter for antineoplastic chemotherapy: Secondary | ICD-10-CM | POA: Diagnosis present

## 2022-06-10 DIAGNOSIS — Z95828 Presence of other vascular implants and grafts: Secondary | ICD-10-CM

## 2022-06-10 DIAGNOSIS — Z5112 Encounter for antineoplastic immunotherapy: Secondary | ICD-10-CM | POA: Diagnosis present

## 2022-06-10 DIAGNOSIS — H5319 Other subjective visual disturbances: Secondary | ICD-10-CM | POA: Diagnosis not present

## 2022-06-10 DIAGNOSIS — Z5189 Encounter for other specified aftercare: Secondary | ICD-10-CM | POA: Insufficient documentation

## 2022-06-10 LAB — CBC WITH DIFFERENTIAL (CANCER CENTER ONLY)
Abs Immature Granulocytes: 0.02 10*3/uL (ref 0.00–0.07)
Basophils Absolute: 0 10*3/uL (ref 0.0–0.1)
Basophils Relative: 1 %
Eosinophils Absolute: 0 10*3/uL (ref 0.0–0.5)
Eosinophils Relative: 0 %
HCT: 31.1 % — ABNORMAL LOW (ref 36.0–46.0)
Hemoglobin: 10.8 g/dL — ABNORMAL LOW (ref 12.0–15.0)
Immature Granulocytes: 0 %
Lymphocytes Relative: 42 %
Lymphs Abs: 3.5 10*3/uL (ref 0.7–4.0)
MCH: 35.1 pg — ABNORMAL HIGH (ref 26.0–34.0)
MCHC: 34.7 g/dL (ref 30.0–36.0)
MCV: 101 fL — ABNORMAL HIGH (ref 80.0–100.0)
Monocytes Absolute: 0.9 10*3/uL (ref 0.1–1.0)
Monocytes Relative: 10 %
Neutro Abs: 3.8 10*3/uL (ref 1.7–7.7)
Neutrophils Relative %: 47 %
Platelet Count: 197 10*3/uL (ref 150–400)
RBC: 3.08 MIL/uL — ABNORMAL LOW (ref 3.87–5.11)
RDW: 15.9 % — ABNORMAL HIGH (ref 11.5–15.5)
WBC Count: 8.3 10*3/uL (ref 4.0–10.5)
nRBC: 0 % (ref 0.0–0.2)

## 2022-06-10 LAB — CMP (CANCER CENTER ONLY)
ALT: 26 U/L (ref 0–44)
AST: 23 U/L (ref 15–41)
Albumin: 4.1 g/dL (ref 3.5–5.0)
Alkaline Phosphatase: 57 U/L (ref 38–126)
Anion gap: 7 (ref 5–15)
BUN: 18 mg/dL (ref 6–20)
CO2: 27 mmol/L (ref 22–32)
Calcium: 9.3 mg/dL (ref 8.9–10.3)
Chloride: 105 mmol/L (ref 98–111)
Creatinine: 0.85 mg/dL (ref 0.44–1.00)
GFR, Estimated: >60 mL/min (ref >60)
Glucose, Bld: 100 mg/dL — ABNORMAL HIGH (ref 70–99)
Potassium: 3.3 mmol/L — ABNORMAL LOW (ref 3.5–5.1)
Sodium: 139 mmol/L (ref 135–145)
Total Bilirubin: 0.9 mg/dL (ref 0.3–1.2)
Total Protein: 6.8 g/dL (ref 6.5–8.1)

## 2022-06-10 MED ORDER — SODIUM CHLORIDE 0.9 % IV SOLN
150.0000 mg | Freq: Once | INTRAVENOUS | Status: AC
  Administered 2022-06-10: 150 mg via INTRAVENOUS
  Filled 2022-06-10: qty 150

## 2022-06-10 MED ORDER — TRASTUZUMAB-DKST CHEMO 150 MG IV SOLR
6.0000 mg/kg | Freq: Once | INTRAVENOUS | Status: AC
  Administered 2022-06-10: 441 mg via INTRAVENOUS
  Filled 2022-06-10: qty 21

## 2022-06-10 MED ORDER — SODIUM CHLORIDE 0.9 % IV SOLN: Freq: Once | INTRAVENOUS | Status: AC

## 2022-06-10 MED ORDER — ACETAMINOPHEN 325 MG PO TABS
650.0000 mg | ORAL_TABLET | Freq: Once | ORAL | Status: AC
  Administered 2022-06-10: 650 mg via ORAL
  Filled 2022-06-10: qty 2

## 2022-06-10 MED ORDER — SODIUM CHLORIDE 0.9 % IV SOLN
600.0000 mg | Freq: Once | INTRAVENOUS | Status: AC
  Administered 2022-06-10: 600 mg via INTRAVENOUS
  Filled 2022-06-10: qty 60

## 2022-06-10 MED ORDER — SODIUM CHLORIDE 0.9 % IV SOLN
75.0000 mg/m2 | Freq: Once | INTRAVENOUS | Status: AC
  Administered 2022-06-10: 140 mg via INTRAVENOUS
  Filled 2022-06-10: qty 14

## 2022-06-10 MED ORDER — SODIUM CHLORIDE 0.9 % IV SOLN
10.0000 mg | Freq: Once | INTRAVENOUS | Status: AC
  Administered 2022-06-10: 10 mg via INTRAVENOUS
  Filled 2022-06-10: qty 10

## 2022-06-10 MED ORDER — DIPHENHYDRAMINE HCL 50 MG/ML IJ SOLN
25.0000 mg | Freq: Once | INTRAMUSCULAR | Status: AC
  Administered 2022-06-10: 25 mg via INTRAVENOUS
  Filled 2022-06-10: qty 1

## 2022-06-10 MED ORDER — SODIUM CHLORIDE 0.9% FLUSH
10.0000 mL | Freq: Once | INTRAVENOUS | Status: AC
  Administered 2022-06-10: 10 mL

## 2022-06-10 MED ORDER — PALONOSETRON HCL INJECTION 0.25 MG/5ML
0.2500 mg | Freq: Once | INTRAVENOUS | Status: AC
  Administered 2022-06-10: 0.25 mg via INTRAVENOUS
  Filled 2022-06-10: qty 5

## 2022-06-10 NOTE — Assessment & Plan Note (Addendum)
03/04/2022 History of subpectoral implants, palpable left breast mass by ultrasound 1.4 cm. Multiple additional masses 1.1 and 0.9 cm. 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 Breast MRI showed at least 7 different masses largest 1.6 cm. No lymphadenopathy  Treatment plan: 1. Neoadjuvant chemotherapy with TCH 6 cycles followed by Herceptin maintenance versus Kadcyla maintenance (based on response to neoadjuvant chemo) for 1 year 2. Followed bymastectomywith sentinel lymph node study 3. Followed by adjuvantantiestrogen therapy -------------------------------------------------------------------------------------------------------------------------------------------- Current treatment: Cycle5TCH(on 03/18/2022) Chemo Toxicities: 1. Rash: Did not happen again with the last cycle of treatment. 2. Bone pain due to Neulasta she is able to manage the pain with Tylenol, advised to take Motrin for couple of days if needed 3. Taste changes: Taste is improving 4. Hair thinning: She is using dignicap.  She is undergoing mastectomy and plastic surgery at [facility] with a plastic surgeon named Dr. Harle Stanford. Surgery date has been set for October   Return to clinic in3weeks for cycle6

## 2022-06-12 ENCOUNTER — Inpatient Hospital Stay: Payer: 59

## 2022-06-12 VITALS — BP 113/76 | HR 55 | Temp 98.0°F | Resp 18

## 2022-06-12 DIAGNOSIS — Z5111 Encounter for antineoplastic chemotherapy: Secondary | ICD-10-CM | POA: Diagnosis not present

## 2022-06-12 DIAGNOSIS — Z170 Estrogen receptor positive status [ER+]: Secondary | ICD-10-CM

## 2022-06-12 MED ORDER — PEGFILGRASTIM-BMEZ 6 MG/0.6ML ~~LOC~~ SOSY
6.0000 mg | PREFILLED_SYRINGE | Freq: Once | SUBCUTANEOUS | Status: AC
  Administered 2022-06-12: 6 mg via SUBCUTANEOUS
  Filled 2022-06-12: qty 0.6

## 2022-06-13 ENCOUNTER — Ambulatory Visit (HOSPITAL_COMMUNITY): Payer: 59 | Attending: Cardiology

## 2022-06-13 DIAGNOSIS — I427 Cardiomyopathy due to drug and external agent: Secondary | ICD-10-CM | POA: Diagnosis not present

## 2022-06-13 DIAGNOSIS — I351 Nonrheumatic aortic (valve) insufficiency: Secondary | ICD-10-CM

## 2022-06-13 LAB — ECHOCARDIOGRAM LIMITED
Area-P 1/2: 3.02 cm2
S' Lateral: 3 cm
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
## 2022-06-18 ENCOUNTER — Encounter: Payer: Self-pay | Admitting: Internal Medicine

## 2022-06-18 ENCOUNTER — Ambulatory Visit: Payer: 59 | Admitting: Internal Medicine

## 2022-06-18 VITALS — BP 126/78 | HR 65 | Ht 68.0 in | Wt 162.6 lb

## 2022-06-18 DIAGNOSIS — I427 Cardiomyopathy due to drug and external agent: Secondary | ICD-10-CM | POA: Diagnosis not present

## 2022-06-18 NOTE — Patient Instructions (Signed)
Medication Instructions:  No Changes In Medications at this time.  *If you need a refill on your cardiac medications before your next appointment, please call your pharmacy*  Lab Work: None Ordered At This Time.  If you have labs (blood work) drawn today and your tests are completely normal, you will receive your results only by: [place] (if you have MyChart) OR A paper copy in the mail If you have any lab test that is abnormal or we need to change your treatment, we will call you to review the results.  Testing/Procedures: Your physician has requested that you have an echocardiogram IN 4 MONTHS. Echocardiography is a painless test that uses sound waves to create images of your heart. It provides your doctor with information about the size and shape of your heart and how well your heart's chambers and valves are working. You may receive an ultrasound enhancing agent through an IV if needed to better visualize your heart during the echo.This procedure takes approximately one hour. There are no restrictions for this procedure. This will take place at the [address], [address].   Follow-Up: At [facility], you and your health needs are our priority.  As part of our continuing mission to provide you with exceptional heart care, we have created designated Provider Care Teams.  These Care Teams include your primary Cardiologist (physician) and Advanced Practice Providers (APPs -  Physician Assistants and Nurse Practitioners) who all work together to provide you with the care you need, when you need it.  Your next appointment:   MAY OF NEXT YEAR   The format for your next appointment:   In Person  Provider:   Janina Mayo, MD

## 2022-06-19 ENCOUNTER — Other Ambulatory Visit: Payer: Self-pay

## 2022-06-29 NOTE — Progress Notes (Signed)
Patient Care Team: Donnajean Lopes, MD as PCP - General (Internal Medicine) Janina Mayo, MD as PCP - Cardiology (Cardiology) Mauro Kaufmann, RN as Oncology Nurse Navigator Rockwell Germany, RN as Oncology Nurse Navigator Nicholas Lose, MD as Consulting Physician (Hematology and Oncology) Rolm Bookbinder, MD as Consulting Physician (General Surgery)  DIAGNOSIS: No diagnosis found.  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  Multiple additional masses 1.1 and 0.9 cm.  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  Breast MRI showed at least 7 different masses largest 1.6 cm.  No lymphadenopathy   03/17/2022 Genetic Testing   Negative genetic testing through the CancerNext performed at [facility] Surgery office.  The report date is Mar 17, 2022.  The CancerNext gene panel offered by [employer] includes sequencing and rearrangement analysis for the following 34 genes:   APC, ATM, BARD1, BMPR1A, BRCA1, BRCA2, BRIP1, CDH1, CDK4, CDKN2A, CHEK2, DICER1, HOXB13, EPCAM, GREM1, MLH1, MRE11A, MSH2, MSH6, MUTYH, NBN, NF1, PALB2, PMS2, POLD1, POLE, PTEN, RAD50, RAD51C, RAD51D, SMAD4, SMARCA4, STK11, and TP53.     03/18/2022 -  Chemotherapy   Patient is on Treatment Plan : BREAST Docetaxel + Carboplatin + Trastuzumab (TCH) q21d / Trastuzumab q21d       CHIEF COMPLIANT: Follow up cyle 6 TCH    INTERVAL HISTORY: [name] is a 41 year old with above-mentioned history of of breast cancer that is HER2 positive on [facility]. She presents to the clinic today for a follow-up.   ALLERGIES:  has No Known Allergies.  MEDICATIONS:  Current Outpatient Medications  Medication Sig Dispense Refill   Dexamethasone (DECADRON PO)  Take by mouth.     hydrOXYzine (ATARAX) 10 MG tablet Take 1 tablet (10 mg total) by mouth 3 (three) times daily as needed. 30 tablet 0   ondansetron (ZOFRAN) 8 MG tablet Take by mouth every 8 (eight) hours as needed for nausea or vomiting.     tranexamic acid (LYSTEDA) 650 MG TABS tablet Take 2 tablets (1,300 mg total) by mouth 3 (three) times daily. 30 tablet 1   No current facility-administered medications for this visit.    PHYSICAL EXAMINATION: ECOG PERFORMANCE STATUS: {CHL ONC ECOG PS:[id number]}  There were no vitals filed for this visit. There were no vitals filed for this visit.  BREAST:*** No palpable masses or nodules in either right or left breasts. No palpable axillary supraclavicular or infraclavicular adenopathy no breast tenderness or nipple discharge. (exam performed in the presence of a chaperone)  LABORATORY DATA:  I have reviewed the data as listed    Latest Ref Rng & Units 06/10/2022    7:55 AM 05/20/2022    9:03 AM 04/29/2022    8:38 AM  CMP  Glucose 70 - 99 mg/dL 100  122  108   BUN 6 - 20 mg/dL _0 Creatinine 0.44 - 1.00 mg/dL 0.85  0.87  0.86   Sodium 135 - 145 mmol/L 139  139  138   Potassium 3.5 - 5.1 mmol/L 3.3  3.1  3.5   Chloride 98 - 111 mmol/L 105  105  104   CO2 22 - 32 mmol/L _1 Calcium 8.9 - 10.3 mg/dL 9.3  9.7  9.8   Total Protein 6.5 - 8.1 g/dL 6.8  7.1  7.0   Total Bilirubin 0.3 - 1.2 mg/dL 0.9  0.9  0.7   Alkaline Phos 38 - 126 U/L 57  62  71   AST 15 - 41 U/L _0 ALT 0 - 44 U/L _1 Lab Results  Component Value Date   WBC 8.3 06/10/2022   HGB 10.8 (L) 06/10/2022   HCT 31.1 (L) 06/10/2022   MCV 101.0 (H) 06/10/2022   PLT 197 06/10/2022   NEUTROABS 3.8 06/10/2022    ASSESSMENT & PLAN:  No problem-specific Assessment & Plan notes found for this encounter.    No orders of the defined types were placed in this encounter.  The patient has a good understanding of the overall plan. she agrees with  it. she will call with any problems that may develop before the next visit here. Total time spent: 30 mins including face to face time and time spent for planning, charting and co-ordination of care   Suzzette Righter, [place] 06/29/22    I Gardiner Coins am scribing for Dr. Lindi Adie  ***

## 2022-06-30 MED FILL — Dexamethasone Sodium Phosphate Inj 100 MG/10ML: INTRAMUSCULAR | Qty: 1 | Status: AC

## 2022-06-30 MED FILL — Fosaprepitant Dimeglumine For IV Infusion 150 MG (Base Eq): INTRAVENOUS | Qty: 5 | Status: AC

## 2022-07-01 ENCOUNTER — Inpatient Hospital Stay (HOSPITAL_BASED_OUTPATIENT_CLINIC_OR_DEPARTMENT_OTHER): Payer: 59 | Admitting: Hematology and Oncology

## 2022-07-01 ENCOUNTER — Other Ambulatory Visit: Payer: Self-pay

## 2022-07-01 ENCOUNTER — Encounter: Payer: Self-pay | Admitting: *Deleted

## 2022-07-01 ENCOUNTER — Inpatient Hospital Stay: Payer: 59

## 2022-07-01 DIAGNOSIS — Z170 Estrogen receptor positive status [ER+]: Secondary | ICD-10-CM

## 2022-07-01 DIAGNOSIS — C50012 Malignant neoplasm of nipple and areola, left female breast: Secondary | ICD-10-CM

## 2022-07-01 DIAGNOSIS — Z5111 Encounter for antineoplastic chemotherapy: Secondary | ICD-10-CM | POA: Diagnosis not present

## 2022-07-01 LAB — COMPREHENSIVE METABOLIC PANEL
ALT: 31 U/L (ref 0–44)
AST: 23 U/L (ref 15–41)
Albumin: 4.1 g/dL (ref 3.5–5.0)
Alkaline Phosphatase: 63 U/L (ref 38–126)
Anion gap: 6 (ref 5–15)
BUN: 15 mg/dL (ref 6–20)
CO2: 28 mmol/L (ref 22–32)
Calcium: 9.8 mg/dL (ref 8.9–10.3)
Chloride: 106 mmol/L (ref 98–111)
Creatinine, Ser: 0.84 mg/dL (ref 0.44–1.00)
GFR, Estimated: >60 mL/min (ref >60)
Glucose, Bld: 99 mg/dL (ref 70–99)
Potassium: 3.3 mmol/L — ABNORMAL LOW (ref 3.5–5.1)
Sodium: 140 mmol/L (ref 135–145)
Total Bilirubin: 0.6 mg/dL (ref 0.3–1.2)
Total Protein: 6.8 g/dL (ref 6.5–8.1)

## 2022-07-01 LAB — CBC WITH DIFFERENTIAL/PLATELET
Abs Immature Granulocytes: 0.02 10*3/uL (ref 0.00–0.07)
Basophils Absolute: 0 10*3/uL (ref 0.0–0.1)
Basophils Relative: 1 %
Eosinophils Absolute: 0 10*3/uL (ref 0.0–0.5)
Eosinophils Relative: 0 %
HCT: 32.4 % — ABNORMAL LOW (ref 36.0–46.0)
Hemoglobin: 11.6 g/dL — ABNORMAL LOW (ref 12.0–15.0)
Immature Granulocytes: 0 %
Lymphocytes Relative: 48 %
Lymphs Abs: 3.5 10*3/uL (ref 0.7–4.0)
MCH: 36.9 pg — ABNORMAL HIGH (ref 26.0–34.0)
MCHC: 35.8 g/dL (ref 30.0–36.0)
MCV: 103.2 fL — ABNORMAL HIGH (ref 80.0–100.0)
Monocytes Absolute: 0.8 10*3/uL (ref 0.1–1.0)
Monocytes Relative: 11 %
Neutro Abs: 2.9 10*3/uL (ref 1.7–7.7)
Neutrophils Relative %: 40 %
Platelets: 219 10*3/uL (ref 150–400)
RBC: 3.14 MIL/uL — ABNORMAL LOW (ref 3.87–5.11)
RDW: 14.4 % (ref 11.5–15.5)
WBC: 7.3 10*3/uL (ref 4.0–10.5)
nRBC: 0 % (ref 0.0–0.2)

## 2022-07-01 MED ORDER — SODIUM CHLORIDE 0.9% FLUSH
10.0000 mL | INTRAVENOUS | Status: DC | PRN
  Administered 2022-07-01: 10 mL

## 2022-07-01 MED ORDER — VENLAFAXINE HCL ER 37.5 MG PO CP24: 37.5000 mg | ORAL_CAPSULE | Freq: Every day | ORAL | 6 refills | Status: DC

## 2022-07-01 MED ORDER — ACETAMINOPHEN 325 MG PO TABS
650.0000 mg | ORAL_TABLET | Freq: Once | ORAL | Status: AC
  Administered 2022-07-01: 650 mg via ORAL
  Filled 2022-07-01: qty 2

## 2022-07-01 MED ORDER — PALONOSETRON HCL INJECTION 0.25 MG/5ML
0.2500 mg | Freq: Once | INTRAVENOUS | Status: AC
  Administered 2022-07-01: 0.25 mg via INTRAVENOUS
  Filled 2022-07-01: qty 5

## 2022-07-01 MED ORDER — SODIUM CHLORIDE 0.9 % IV SOLN
600.0000 mg | Freq: Once | INTRAVENOUS | Status: AC
  Administered 2022-07-01: 600 mg via INTRAVENOUS
  Filled 2022-07-01: qty 60

## 2022-07-01 MED ORDER — ALPRAZOLAM 0.25 MG PO TABS: 0.2500 mg | ORAL_TABLET | Freq: Every evening | ORAL | 0 refills | Status: DC | PRN

## 2022-07-01 MED ORDER — SODIUM CHLORIDE 0.9 % IV SOLN: Freq: Once | INTRAVENOUS | Status: AC

## 2022-07-01 MED ORDER — DIPHENHYDRAMINE HCL 50 MG/ML IJ SOLN
25.0000 mg | Freq: Once | INTRAMUSCULAR | Status: AC
  Administered 2022-07-01: 25 mg via INTRAVENOUS
  Filled 2022-07-01: qty 1

## 2022-07-01 MED ORDER — SODIUM CHLORIDE 0.9 % IV SOLN
75.0000 mg/m2 | Freq: Once | INTRAVENOUS | Status: AC
  Administered 2022-07-01: 140 mg via INTRAVENOUS
  Filled 2022-07-01: qty 14

## 2022-07-01 MED ORDER — SODIUM CHLORIDE 0.9 % IV SOLN
10.0000 mg | Freq: Once | INTRAVENOUS | Status: AC
  Administered 2022-07-01: 10 mg via INTRAVENOUS
  Filled 2022-07-01: qty 10

## 2022-07-01 MED ORDER — TRASTUZUMAB-DKST CHEMO 150 MG IV SOLR
6.0000 mg/kg | Freq: Once | INTRAVENOUS | Status: AC
  Administered 2022-07-01: 441 mg via INTRAVENOUS
  Filled 2022-07-01: qty 21

## 2022-07-01 MED ORDER — SODIUM CHLORIDE 0.9 % IV SOLN
150.0000 mg | Freq: Once | INTRAVENOUS | Status: AC
  Administered 2022-07-01: 150 mg via INTRAVENOUS
  Filled 2022-07-01: qty 150

## 2022-07-01 MED ORDER — HEPARIN SOD (PORK) LOCK FLUSH 100 UNIT/ML IV SOLN
500.0000 [IU] | Freq: Once | INTRAVENOUS | Status: AC | PRN
  Administered 2022-07-01: 500 [IU]

## 2022-07-01 NOTE — Patient Instructions (Signed)
[facility] ONCOLOGY  Discharge Instructions: Thank you for choosing [facility] to provide your oncology and hematology care.   If you have a lab appointment with the [facility], please go directly to the [facility] and check in at the registration area.   Wear comfortable clothing and clothing appropriate for easy access to any Portacath or PICC line.   We strive to give you quality time with your provider. You may need to reschedule your appointment if you arrive late (15 or more minutes).  Arriving late affects you and other patients whose appointments are after yours.  Also, if you miss three or more appointments without notifying the office, you may be dismissed from the clinic at the provider's discretion.      For prescription refill requests, have your pharmacy contact our office and allow 72 hours for refills to be completed.    Today you received the following chemotherapy and/or immunotherapy agents Ogivri, Taxotere, Carboplatin      To help prevent nausea and vomiting after your treatment, we encourage you to take your nausea medication as directed.  BELOW ARE SYMPTOMS THAT SHOULD BE REPORTED IMMEDIATELY: *FEVER GREATER THAN 100.4 F (38 C) OR HIGHER *CHILLS OR SWEATING *NAUSEA AND VOMITING THAT IS NOT CONTROLLED WITH YOUR NAUSEA MEDICATION *UNUSUAL SHORTNESS OF BREATH *UNUSUAL BRUISING OR BLEEDING *URINARY PROBLEMS (pain or burning when urinating, or frequent urination) *BOWEL PROBLEMS (unusual diarrhea, constipation, pain near the anus) TENDERNESS IN MOUTH AND THROAT WITH OR WITHOUT PRESENCE OF ULCERS (sore throat, sores in mouth, or a toothache) UNUSUAL RASH, SWELLING OR PAIN  UNUSUAL VAGINAL DISCHARGE OR ITCHING   Items with * indicate a potential emergency and should be followed up as soon as possible or go to the Emergency Department if any problems should occur.  Please show the CHEMOTHERAPY ALERT CARD or IMMUNOTHERAPY ALERT  CARD at check-in to the Emergency Department and triage nurse.  Should you have questions after your visit or need to cancel or reschedule your appointment, please contact [facility]  Dept: [phone]  and follow the prompts.  Office hours are 8:00 a.m. to 4:30 p.m. Monday - Friday. Please note that voicemails left after 4:00 p.m. may not be returned until the following business day.  We are closed weekends and major holidays. You have access to a nurse at all times for urgent questions. Please call the main number to the clinic Dept: [phone] and follow the prompts.   For any non-urgent questions, you may also contact your provider using MyChart. We now offer e-Visits for anyone 16 and older to request care online for non-urgent symptoms. For details visit [email or web address].   Also download the MyChart app! Go to the app store, search "MyChart", open the app, select [facility], and log in with your MyChart username and password.  Masks are optional in the cancer centers. If you would like for your care team to wear a mask while they are taking care of you, please let them know. For doctor visits, patients may have with them one support Kazue Cerro who is at least 42 years old. At this time, visitors are not allowed in the infusion area.

## 2022-07-01 NOTE — Assessment & Plan Note (Signed)
03/04/2022 History of subpectoral implants, palpable left breast mass by ultrasound 1.4 cm. Multiple additional masses 1.1 and 0.9 cm. 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 Breast MRI showed at least 7 different masses largest 1.6 cm. No lymphadenopathy  Treatment plan: 1. Neoadjuvant chemotherapy with TCH 6 cycles followed by Herceptin maintenance versus Kadcyla maintenance (based on response to neoadjuvant chemo) for 1 year 2. Followed bymastectomywith sentinel lymph node study 3. Followed by adjuvantantiestrogen therapy -------------------------------------------------------------------------------------------------------------------------------------------- Current treatment: Cycle6TCH(on 03/18/2022) Chemo Toxicities: 1. Rash: Did not happen again with the last cycle of treatment. 2. Bone pain due to Neulasta she is able to manage the pain with Tylenol, advised to take Motrin for couple of days if needed 3. Taste changes: Taste is improving 4. Hair thinning: She is using dignicap.  She is undergoing mastectomy and plastic surgery at [facility] with a plastic surgeon named Dr. Harle Stanford. Surgery date has been set for October   Return to clinic in3weeks for Herceptin maintenance until we see the pathology result and then we can decide if she needs to continue with Herceptin maintenance or switch to Kadcyla.

## 2022-07-03 ENCOUNTER — Inpatient Hospital Stay: Payer: 59

## 2022-07-03 ENCOUNTER — Inpatient Hospital Stay (HOSPITAL_BASED_OUTPATIENT_CLINIC_OR_DEPARTMENT_OTHER): Payer: 59 | Admitting: Hematology and Oncology

## 2022-07-03 ENCOUNTER — Other Ambulatory Visit: Payer: Self-pay

## 2022-07-03 DIAGNOSIS — Z5111 Encounter for antineoplastic chemotherapy: Secondary | ICD-10-CM | POA: Diagnosis not present

## 2022-07-03 DIAGNOSIS — Z170 Estrogen receptor positive status [ER+]: Secondary | ICD-10-CM | POA: Diagnosis not present

## 2022-07-03 DIAGNOSIS — C50012 Malignant neoplasm of nipple and areola, left female breast: Secondary | ICD-10-CM

## 2022-07-03 MED ORDER — PEGFILGRASTIM INJECTION 6 MG/0.6ML ~~LOC~~
6.0000 mg | PREFILLED_SYRINGE | Freq: Once | SUBCUTANEOUS | Status: AC
  Administered 2022-07-03: 6 mg via SUBCUTANEOUS
  Filled 2022-07-03: qty 0.6

## 2022-07-04 ENCOUNTER — Encounter: Payer: Self-pay | Admitting: Hematology and Oncology

## 2022-07-04 NOTE — Assessment & Plan Note (Addendum)
03/04/2022 History of subpectoral implants, palpable left breast mass by ultrasound 1.4 cm. Multiple additional masses 1.1 and 0.9 cm. 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 Breast MRI showed at least 7 different masses largest 1.6 cm. No lymphadenopathy  Treatment plan: 1. Neoadjuvant chemotherapy with TCH 6 cycles followed by Herceptin maintenance versus Kadcyla maintenance (based on response to neoadjuvant chemo) for 1 year 2. Followed bymastectomywith sentinel lymph node study 3. Followed by adjuvantantiestrogen therapy -------------------------------------------------------------------------------------------------------------------------------------------- Current treatment: completed [facility]  She is here for some vision changes describes as halo vision and excessive tearing. No obvious abnormalities noted however exam limited, Recommend follow up with ophthalmologist for a full eye exam, she said she will contact her opthalmologist We will continue to follow up with her as needed.

## 2022-07-04 NOTE — Progress Notes (Signed)
Patient Care Team: Donnajean Lopes, MD as PCP - General (Internal Medicine) Janina Mayo, MD as PCP - Cardiology (Cardiology) Mauro Kaufmann, RN as Oncology Nurse Navigator Rockwell Germany, RN as Oncology Nurse Navigator Nicholas Lose, MD as Consulting Physician (Hematology and Oncology) Rolm Bookbinder, MD as Consulting Physician (General Surgery)  DIAGNOSIS:  Encounter Diagnosis  Name Primary?   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  Multiple additional masses 1.1 and 0.9 cm.  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  Breast MRI showed at least 7 different masses largest 1.6 cm.  No lymphadenopathy   03/17/2022 Genetic Testing   Negative genetic testing through the CancerNext performed at [facility] office.  The report date is Mar 17, 2022.  The CancerNext gene panel offered by [employer] includes sequencing and rearrangement analysis for the following 34 genes:   APC, ATM, BARD1, BMPR1A, BRCA1, BRCA2, BRIP1, CDH1, CDK4, CDKN2A, CHEK2, DICER1, HOXB13, EPCAM, GREM1, MLH1, MRE11A, MSH2, MSH6, MUTYH, NBN, NF1, PALB2, PMS2, POLD1, POLE, PTEN, RAD50, RAD51C, RAD51D, SMAD4, SMARCA4, STK11, and TP53.     03/18/2022 -  Chemotherapy   Patient is on Treatment Plan : BREAST Docetaxel + Carboplatin + Trastuzumab (TCH) q21d / Trastuzumab q21d       CHIEF COMPLIANT: Interim visit for some vision changes.    INTERVAL HISTORY: [name] is a 41 year old with above-mentioned history of of breast cancer that is HER2 positive on [facility]. She presents to the clinic today for an unplanned  visit. She mentioned that she has noted some vision changes, like a halo effect when she looks at bright light and when she tries to focus on other things right after. This is episodic, she doesn't have this at this time. No other complaints except excess tearing.  ALLERGIES:  has No Known Allergies.  MEDICATIONS:  Current Outpatient Medications  Medication Sig Dispense Refill   ALPRAZolam (XANAX) 0.25 MG tablet Take 1 tablet (0.25 mg total) by mouth at bedtime as needed for anxiety. 30 tablet 0   hydrOXYzine (ATARAX) 10 MG tablet Take 1 tablet (10 mg total) by mouth 3 (three) times daily as needed. 30 tablet 0   ondansetron (ZOFRAN) 8 MG tablet Take by mouth every 8 (eight) hours as needed for nausea or vomiting.     tranexamic acid (LYSTEDA) 650 MG TABS tablet Take 2 tablets (1,300 mg total) by mouth 3 (three) times daily. 30 tablet 1   venlafaxine XR (EFFEXOR-XR) 37.5 MG 24 hr capsule Take 1 capsule (37.5 mg total) by mouth daily with breakfast. 30 capsule 6   No current facility-administered medications for this visit.    PHYSICAL EXAMINATION: ECOG PERFORMANCE STATUS: 1 - Symptomatic but completely ambulatory  Vitals:   07/03/22 1318  BP: 114/63  Pulse: 68  Resp: 18  Temp: (!) 96.5 F (35.8 C)  SpO2: 98%   There were no vitals filed for this visit.  General: alert, oriented and in no acute distress Eyes: within the limits of eye exam, no obvious abnormality  LABORATORY DATA:  I have reviewed the data as listed    Latest Ref  Rng & Units 07/01/2022    9:13 AM 06/10/2022    7:55 AM 05/20/2022    9:03 AM  CMP  Glucose 70 - 99 mg/dL 99  100  122   BUN 6 - 20 mg/dL '15  18  18   ' Creatinine 0.44 - 1.00 mg/dL 0.84  0.85  0.87   Sodium 135 - 145 mmol/L 140  139  139   Potassium 3.5 - 5.1 mmol/L 3.3  3.3  3.1   Chloride 98 - 111 mmol/L 106  105  105   CO2 22 - 32 mmol/L '28  27  27   ' Calcium 8.9 - 10.3 mg/dL 9.8  9.3  9.7   Total Protein 6.5 - 8.1 g/dL 6.8  6.8  7.1   Total  Bilirubin 0.3 - 1.2 mg/dL 0.6  0.9  0.9   Alkaline Phos 38 - 126 U/L 63  57  62   AST 15 - 41 U/L '23  23  22   ' ALT 0 - 44 U/L '31  26  29     ' Lab Results  Component Value Date   WBC 7.3 07/01/2022   HGB 11.6 (L) 07/01/2022   HCT 32.4 (L) 07/01/2022   MCV 103.2 (H) 07/01/2022   PLT 219 07/01/2022   NEUTROABS 2.9 07/01/2022    ASSESSMENT & PLAN:  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  Multiple additional masses 1.1 and 0.9 cm.  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  Breast MRI showed at least 7 different masses largest 1.6 cm.  No lymphadenopathy   Treatment plan: 1. Neoadjuvant chemotherapy with TCH  6 cycles followed by Herceptin maintenance versus Kadcyla maintenance (based on response to neoadjuvant chemo) for 1 year 2. Followed by mastectomy with sentinel lymph node study 3. Followed by adjuvant antiestrogen therapy -------------------------------------------------------------------------------------------------------------------------------------------- Current treatment: completed [facility]  She is here for some vision changes describes as halo vision and excessive tearing. No obvious abnormalities noted however exam limited, Recommend follow up with ophthalmologist, she said she has seen one. We will continue to follow up with her as needed.    Benay Pike, MD 07/04/22  I have reviewed the above documentation for accuracy and completeness, and I agree with the above.

## 2022-07-15 ENCOUNTER — Other Ambulatory Visit: Payer: Self-pay | Admitting: Hematology and Oncology

## 2022-07-22 ENCOUNTER — Inpatient Hospital Stay: Payer: 59 | Attending: Hematology and Oncology

## 2022-07-22 ENCOUNTER — Other Ambulatory Visit: Payer: Self-pay

## 2022-07-22 ENCOUNTER — Other Ambulatory Visit: Payer: Self-pay | Admitting: Hematology and Oncology

## 2022-07-22 VITALS — BP 131/89 | HR 56 | Temp 98.1°F | Resp 18

## 2022-07-22 DIAGNOSIS — Z5112 Encounter for antineoplastic immunotherapy: Secondary | ICD-10-CM | POA: Diagnosis present

## 2022-07-22 DIAGNOSIS — C50012 Malignant neoplasm of nipple and areola, left female breast: Secondary | ICD-10-CM | POA: Diagnosis not present

## 2022-07-22 DIAGNOSIS — Z170 Estrogen receptor positive status [ER+]: Secondary | ICD-10-CM

## 2022-07-22 MED ORDER — TRASTUZUMAB-DKST CHEMO 150 MG IV SOLR
6.0000 mg/kg | Freq: Once | INTRAVENOUS | Status: AC
  Administered 2022-07-22: 462 mg via INTRAVENOUS
  Filled 2022-07-22: qty 22

## 2022-07-22 MED ORDER — ACETAMINOPHEN 325 MG PO TABS
650.0000 mg | ORAL_TABLET | Freq: Once | ORAL | Status: AC
  Administered 2022-07-22: 650 mg via ORAL
  Filled 2022-07-22: qty 2

## 2022-07-22 MED ORDER — SODIUM CHLORIDE 0.9% FLUSH
10.0000 mL | INTRAVENOUS | Status: DC | PRN
  Administered 2022-07-22: 10 mL

## 2022-07-22 MED ORDER — SODIUM CHLORIDE 0.9 % IV SOLN: Freq: Once | INTRAVENOUS | Status: AC

## 2022-07-22 MED ORDER — DIPHENHYDRAMINE HCL 25 MG PO CAPS
50.0000 mg | ORAL_CAPSULE | Freq: Once | ORAL | Status: AC
  Administered 2022-07-22: 50 mg via ORAL
  Filled 2022-07-22: qty 2

## 2022-07-22 MED ORDER — HEPARIN SOD (PORK) LOCK FLUSH 100 UNIT/ML IV SOLN
500.0000 [IU] | Freq: Once | INTRAVENOUS | Status: AC | PRN
  Administered 2022-07-22: 500 [IU]

## 2022-07-22 NOTE — Patient Instructions (Signed)
[facility] MEDICAL ONCOLOGY  Discharge Instructions: Thank you for choosing [facility] to provide your oncology and hematology care.   If you have a lab appointment with the Cancer Center, please go directly to the Cancer Center and check in at the registration area.   Wear comfortable clothing and clothing appropriate for easy access to any Portacath or PICC line.   We strive to give you quality time with your provider. You may need to reschedule your appointment if you arrive late (15 or more minutes).  Arriving late affects you and other patients whose appointments are after yours.  Also, if you miss three or more appointments without notifying the office, you may be dismissed from the clinic at the provider's discretion.      For prescription refill requests, have your pharmacy contact our office and allow 72 hours for refills to be completed.    Today you received the following chemotherapy and/or immunotherapy agents: Og[place]ri      To help prevent nausea and vomiting after your treatment, we encourage you to take your nausea medication as directed.  BELOW ARE SYMPTOMS THAT SHOULD BE REPORTED IMMEDIATELY: *FEVER GREATER THAN 100.4 F (38 C) OR HIGHER *CHILLS OR SWEATING *NAUSEA AND VOMITING THAT IS NOT CONTROLLED WITH YOUR NAUSEA MEDICATION *UNUSUAL SHORTNESS OF BREATH *UNUSUAL BRUISING OR BLEEDING *URINARY PROBLEMS (pain or burning when urinating, or frequent urination) *BOWEL PROBLEMS (unusual diarrhea, constipation, pain near the anus) TENDERNESS IN MOUTH AND THROAT WITH OR WITHOUT PRESENCE OF ULCERS (sore throat, sores in mouth, or a toothache) UNUSUAL RASH, SWELLING OR PAIN  UNUSUAL VAGINAL DISCHARGE OR ITCHING   Items with * indicate a potential emergency and should be followed up as soon as possible or go to the Emergency Department if any problems should occur.  Please show the CHEMOTHERAPY ALERT CARD or IMMUNOTHERAPY ALERT CARD at check-in to the  Emergency Department and triage nurse.  Should you have questions after your visit or need to cancel or reschedule your appointment, please contact [facility] MEDICAL ONCOLOGY  Dept: [phone]  and follow the prompts.  Office hours are 8:00 a.m. to 4:30 p.m. Monday - Friday. Please note that voicemails left after 4:00 p.m. may not be returned until the following business day.  We are closed weekends and major holidays. You have access to a nurse at all times for urgent questions. Please call the main number to the clinic Dept: [phone] and follow the prompts.   For any non-urgent questions, you may also contact your provider using MyChart. We now offer e-Visits for anyone 18 and older to request care online for non-urgent symptoms. For details visit [email or web address].   Also download the MyChart app! Go to the app store, search "MyChart", open the app, select [facility], and log in with your MyChart username and password.  Masks are optional in the cancer centers. If you would like for your care team to wear a mask while they are taking care of you, please let them know. You may have one support person who is at least 42 years old accompany you for your appointments. 

## 2022-07-23 ENCOUNTER — Other Ambulatory Visit: Payer: Self-pay | Admitting: Hematology and Oncology

## 2022-07-24 ENCOUNTER — Encounter: Payer: Self-pay | Admitting: Hematology and Oncology

## 2022-07-24 NOTE — Telephone Encounter (Signed)
S/w pt regarding mychart message. Advised pt she should probably take off work for infusion days as we do recommend benadryl for a pre-medication to her treatment.  Pt also asks that we change her appt 08/26/22 to virtual with MD as she will be recovering from surgery. Message sent to scheduler to change. She is aware and verbalized thanks.

## 2022-08-06 ENCOUNTER — Encounter: Payer: Self-pay | Admitting: Hematology and Oncology

## 2022-08-12 ENCOUNTER — Inpatient Hospital Stay: Payer: 59 | Attending: Hematology and Oncology

## 2022-08-12 ENCOUNTER — Encounter: Payer: Self-pay | Admitting: *Deleted

## 2022-08-12 ENCOUNTER — Other Ambulatory Visit: Payer: Self-pay | Admitting: Hematology and Oncology

## 2022-08-12 VITALS — BP 148/93 | HR 66 | Temp 98.0°F | Resp 18

## 2022-08-12 DIAGNOSIS — C50012 Malignant neoplasm of nipple and areola, left female breast: Secondary | ICD-10-CM | POA: Insufficient documentation

## 2022-08-12 DIAGNOSIS — Z9013 Acquired absence of bilateral breasts and nipples: Secondary | ICD-10-CM | POA: Insufficient documentation

## 2022-08-12 DIAGNOSIS — Z5112 Encounter for antineoplastic immunotherapy: Secondary | ICD-10-CM | POA: Insufficient documentation

## 2022-08-12 DIAGNOSIS — Z170 Estrogen receptor positive status [ER+]: Secondary | ICD-10-CM | POA: Insufficient documentation

## 2022-08-12 DIAGNOSIS — Z79811 Long term (current) use of aromatase inhibitors: Secondary | ICD-10-CM | POA: Insufficient documentation

## 2022-08-12 MED ORDER — SODIUM CHLORIDE 0.9 % IV SOLN: Freq: Once | INTRAVENOUS | Status: AC

## 2022-08-12 MED ORDER — DIPHENHYDRAMINE HCL 25 MG PO CAPS: 50.0000 mg | ORAL_CAPSULE | Freq: Once | ORAL | Status: DC

## 2022-08-12 MED ORDER — SODIUM CHLORIDE 0.9% FLUSH
10.0000 mL | INTRAVENOUS | Status: DC | PRN
  Administered 2022-08-12: 10 mL

## 2022-08-12 MED ORDER — TRASTUZUMAB-DKST CHEMO 150 MG IV SOLR
6.0000 mg/kg | Freq: Once | INTRAVENOUS | Status: AC
  Administered 2022-08-12: 462 mg via INTRAVENOUS
  Filled 2022-08-12: qty 22

## 2022-08-12 MED ORDER — ACETAMINOPHEN 325 MG PO TABS
650.0000 mg | ORAL_TABLET | Freq: Once | ORAL | Status: AC
  Administered 2022-08-12: 650 mg via ORAL
  Filled 2022-08-12: qty 2

## 2022-08-12 MED ORDER — HEPARIN SOD (PORK) LOCK FLUSH 100 UNIT/ML IV SOLN
500.0000 [IU] | Freq: Once | INTRAVENOUS | Status: AC | PRN
  Administered 2022-08-12: 500 [IU]

## 2022-08-12 NOTE — Patient Instructions (Signed)
[facility] MEDICAL ONCOLOGY  Discharge Instructions: Thank you for choosing [facility] to provide your oncology and hematology care.   If you have a lab appointment with the Cancer Center, please go directly to the Cancer Center and check in at the registration area.   Wear comfortable clothing and clothing appropriate for easy access to any Portacath or PICC line.   We strive to give you quality time with your provider. You may need to reschedule your appointment if you arrive late (15 or more minutes).  Arriving late affects you and other patients whose appointments are after yours.  Also, if you miss three or more appointments without notifying the office, you may be dismissed from the clinic at the provider's discretion.      For prescription refill requests, have your pharmacy contact our office and allow 72 hours for refills to be completed.    Today you received the following chemotherapy and/or immunotherapy agents herceptin      To help prevent nausea and vomiting after your treatment, we encourage you to take your nausea medication as directed.  BELOW ARE SYMPTOMS THAT SHOULD BE REPORTED IMMEDIATELY: *FEVER GREATER THAN 100.4 F (38 C) OR HIGHER *CHILLS OR SWEATING *NAUSEA AND VOMITING THAT IS NOT CONTROLLED WITH YOUR NAUSEA MEDICATION *UNUSUAL SHORTNESS OF BREATH *UNUSUAL BRUISING OR BLEEDING *URINARY PROBLEMS (pain or burning when urinating, or frequent urination) *BOWEL PROBLEMS (unusual diarrhea, constipation, pain near the anus) TENDERNESS IN MOUTH AND THROAT WITH OR WITHOUT PRESENCE OF ULCERS (sore throat, sores in mouth, or a toothache) UNUSUAL RASH, SWELLING OR PAIN  UNUSUAL VAGINAL DISCHARGE OR ITCHING   Items with * indicate a potential emergency and should be followed up as soon as possible or go to the Emergency Department if any problems should occur.  Please show the CHEMOTHERAPY ALERT CARD or IMMUNOTHERAPY ALERT CARD at check-in to  the Emergency Department and triage nurse.  Should you have questions after your visit or need to cancel or reschedule your appointment, please contact  CANCER CENTER MEDICAL ONCOLOGY  Dept: [phone]  and follow the prompts.  Office hours are 8:00 a.m. to 4:30 p.m. Monday - Friday. Please note that voicemails left after 4:00 p.m. may not be returned until the following business day.  We are closed weekends and major holidays. You have access to a nurse at all times for urgent questions. Please call the main number to the clinic Dept: [phone] and follow the prompts.   For any non-urgent questions, you may also contact your provider using MyChart. We now offer e-Visits for anyone 18 and older to request care online for non-urgent symptoms. For details visit [email or web address].   Also download the MyChart app! Go to the app store, search "MyChart", open the app, select , and log in with your MyChart username and password.  Masks are optional in the cancer centers. If you would like for your care team to wear a mask while they are taking care of you, please let them know. You may have one support person who is at least 42 years old accompany you for your appointments. 

## 2022-08-13 HISTORY — PX: MASTECTOMY: SHX3

## 2022-08-18 ENCOUNTER — Telehealth: Payer: Self-pay | Admitting: Hematology and Oncology

## 2022-08-18 NOTE — Telephone Encounter (Signed)
Scheduled appointment per WQ. Patient is aware. 

## 2022-08-21 ENCOUNTER — Encounter: Payer: Self-pay | Admitting: *Deleted

## 2022-08-25 NOTE — Progress Notes (Signed)
HEMATOLOGY-ONCOLOGY [place] VISIT PROGRESS NOTE  I connected with [name] on 08/26/2022 at  8:30 AM EDT by Mychart video conference and verified that I am speaking with the correct person using two identifiers.  I discussed the limitations, risks, security and privacy concerns of performing an evaluation and management service by Webex and the availability of in person appointments.  I also discussed with the patient that there may be a patient responsible charge related to this service. The patient expressed understanding and agreed to proceed.  Patient's Location: Home Physician Location: Clinic  CHIEF COMPLIANT: Follow-up after recent mastectomy and reconstruction in [place]: [name] is a 41 y.o. female with above-mentioned history of left breast cancer treated with neoadjuvant chemotherapy with [place].  She underwent mastectomy with reconstruction on 08/13/2022.  She is doing really well from surgery without any pain or discomfort.  She is connected with me by video to discuss adjuvant treatment plan based upon the recent pathology report.  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)  03/04/2022 Initial Diagnosis   History of subpectoral implants, palpable left breast mass by ultrasound 1.4 cm.  Multiple additional masses 1.1 and 0.9 cm.  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  Breast MRI showed at least 7 different masses largest 1.6 cm.  No lymphadenopathy   03/17/2022 Genetic Testing   Negative genetic testing through the CancerNext performed at [facility] office.  The report date is Mar 17, 2022.  The CancerNext gene panel offered by [employer] includes sequencing and rearrangement analysis for the following 34 genes:   APC, ATM, BARD1, BMPR1A, BRCA1, BRCA2, BRIP1, CDH1, CDK4, CDKN2A, CHEK2, DICER1, HOXB13,  EPCAM, GREM1, MLH1, MRE11A, MSH2, MSH6, MUTYH, NBN, NF1, PALB2, PMS2, POLD1, POLE, PTEN, RAD50, RAD51C, RAD51D, SMAD4, SMARCA4, STK11, and TP53.     03/18/2022 - 07/03/2022 Chemotherapy   Patient is on Treatment Plan : BREAST Docetaxel + Carboplatin + Trastuzumab (TCH) q21d / Trastuzumab q21d     03/18/2022 -  Chemotherapy   Patient is on Treatment Plan : BREAST Docetaxel + Carboplatin + Trastuzumab (TCH) q21d / Trastuzumab q21d       REVIEW OF SYSTEMS:   Constitutional: Denies fevers, chills or abnormal weight loss   All other systems were reviewed with the patient and are negative.  Observations/Objective:  There were no vitals filed for this visit. There is no height or weight on file to calculate BMI.  I have reviewed the data as listed    Latest Ref Rng & Units 07/01/2022    9:13 AM 06/10/2022    7:55 AM 05/20/2022    9:03 AM  CMP  Glucose 70 - 99 mg/dL 99  100  122   BUN 6 - 20 mg/dL _0 Creatinine 0.44 - 1.00 mg/dL 0.84  0.85  0.87   Sodium 135 - 145 mmol/L 140  139  139   Potassium 3.5 - 5.1 mmol/L 3.3  3.3  3.1   Chloride 98 - 111 mmol/L 106  105  105   CO2 22 - 32 mmol/L _1 Calcium 8.9 - 10.3 mg/dL 9.8  9.3  9.7   Total Protein 6.5 - 8.1 g/dL 6.8  6.8  7.1   Total Bilirubin 0.3 - 1.2 mg/dL 0.6  0.9  0.9   Alkaline  Phos 38 - 126 U/L 63  57  62   AST 15 - 41 U/L _0 ALT 0 - 44 U/L _1 Lab Results  Component Value Date   WBC 7.3 07/01/2022   HGB 11.6 (L) 07/01/2022   HCT 32.4 (L) 07/01/2022   MCV 103.2 (H) 07/01/2022   PLT 219 07/01/2022   NEUTROABS 2.9 07/01/2022      Assessment Plan:  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  Multiple additional masses 1.1 and 0.9 cm.  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  Breast MRI showed  at least 7 different masses largest 1.6 cm.  No lymphadenopathy   Treatment plan: 1. Neoadjuvant chemotherapy with TCH  6 cycles completed 07/01/2022 followed by Herceptin maintenance versus Kadcyla maintenance (based on response to neoadjuvant chemo) for 1 year 2. left mastectomy with sentinel lymph node study: 08/13/2022 ([facility]): Multifocal grade 2 IDC 8 mm, 5 mm, 6 mm, 0/4 lymph nodes, ER/PR positive HER2 positive, reconstruction 08/21/2022 3. Followed by adjuvant antiestrogen therapy -------------------------------------------------------------------------------------------------------------------------------------------- Pathology counseling: I discussed the final pathology report of the patient provided  a copy of this report. I discussed the margins as well as lymph node surgeries. We also discussed the final staging along with previously performed ER/PR and HER-2/neu testing.  Treatment plan: 1.  Kadcyla maintenance therapy versus participating in [facility] HER2 clinical trial 2. antiestrogen therapy  Return to clinic in 3 weeks to start [place] on 09/11/2022   I discussed the assessment and treatment plan with the patient. The patient was provided an opportunity to ask questions and all were answered. The patient agreed with the plan and demonstrated an understanding of the instructions. The patient was advised to call back or seek an in-person evaluation if the symptoms worsen or if the condition fails to improve as anticipated.   I provided 20 minutes of face-to-face Web Ex time during this encounter.    Rulon Eisenmenger, MD 08/26/2022 I Gardiner Coins am scribing for Dr. Lindi Adie  I have reviewed the above documentation for accuracy and completeness, and I agree with the above.

## 2022-08-26 ENCOUNTER — Telehealth: Payer: Self-pay | Admitting: Emergency Medicine

## 2022-08-26 ENCOUNTER — Inpatient Hospital Stay (HOSPITAL_BASED_OUTPATIENT_CLINIC_OR_DEPARTMENT_OTHER): Payer: 59 | Admitting: Hematology and Oncology

## 2022-08-26 DIAGNOSIS — C50012 Malignant neoplasm of nipple and areola, left female breast: Secondary | ICD-10-CM | POA: Diagnosis not present

## 2022-08-26 DIAGNOSIS — Z170 Estrogen receptor positive status [ER+]: Secondary | ICD-10-CM

## 2022-08-26 DIAGNOSIS — Z5112 Encounter for antineoplastic immunotherapy: Secondary | ICD-10-CM | POA: Diagnosis not present

## 2022-08-26 MED ORDER — LIDOCAINE-PRILOCAINE 2.5-2.5 % EX CREA: TOPICAL_CREAM | CUTANEOUS | 3 refills | Status: DC

## 2022-08-26 NOTE — Progress Notes (Signed)
DISCONTINUE OFF PATHWAY REGIMEN - Breast   [id number]:Docetaxel + Carboplatin + Trastuzumab [facility]):   A cycle is every 21 days:     Trastuzumab-xxxx      Trastuzumab-xxxx      Docetaxel      Carboplatin   **Always confirm dose/schedule in your pharmacy ordering system**  REASON: Other Reason PRIOR TREATMENT: Off Pathway: Docetaxel + Carboplatin + Trastuzumab (TCH) TREATMENT RESPONSE: Partial Response (PR)  START ON PATHWAY REGIMEN - Breast     A cycle is every 21 days:     Ado-trastuzumab emtansine   **Always confirm dose/schedule in your pharmacy ordering system**  Patient Characteristics: Post-Neoadjuvant Therapy and Resection, HER2 Positive, ER Positive, Residual Disease, Adjuvant Targeted Therapy After Neoadjuvant Chemo/Targeted Therapy Therapeutic Status: Post-Neoadjuvant Therapy and Resection Residual Invasive Disease Post-Neoadjuvant Therapy<= Yes ER Status: Positive (+) HER2 Status: Positive (+) PR Status: Positive (+) Intent of Therapy: Curative Intent, Discussed with Patient

## 2022-08-26 NOTE — Assessment & Plan Note (Signed)
03/04/2022 History of subpectoral implants, palpable left breast mass by ultrasound 1.4 cm. Multiple additional masses 1.1 and 0.9 cm. 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 Breast MRI showed at least 7 different masses largest 1.6 cm. No lymphadenopathy  Treatment plan: 1. Neoadjuvant chemotherapy with TCH 6 cycles completed 07/01/2022 followed by Herceptin maintenance versus Kadcyla maintenance (based on response to neoadjuvant chemo) for 1 year 2. left mastectomywith sentinel lymph node study: 08/13/2022 ([facility]): Multifocal grade 2 IDC 8 mm, 5 mm, 6 mm, 0/4 lymph nodes, ER/PR positive HER2 positive 3. Followed by adjuvantantiestrogen therapy -------------------------------------------------------------------------------------------------------------------------------------------- Pathology counseling: I discussed the final pathology report of the patient provided  a copy of this report. I discussed the margins as well as lymph node surgeries. We also discussed the final staging along with previously performed ER/PR and HER-2/neu testing.  Treatment plan: 1.  Kadcyla maintenance therapy versus participating in [facility] HER2 clinical trial 2. antiestrogen therapy  Return to clinic in 3 weeks to start [facility]

## 2022-08-26 NOTE — Telephone Encounter (Signed)
THE COMPASSHER2 TRIALS (COMPREHENSIVE USE OF PATHOLOGIC RESPONSE ASSESSMENT TO OPTIMIZE THERAPY IN HER2-POSITIVE BREAST CANCER): COMPASSHER2 RESIDUAL DISEASE (RD), A DOUBLE-BLINDED, PHASE III RANDOMIZED TRIAL OF T-DM1 AND PLACEBO COMPARED WITH T-DM1 AND TUCATINIB  Called patient to let her know that she is not eligible for [facility] Her2 trial per referral today from Dr. Lindi Adie.  The patient does not meet the disease eligibility requirements.  Patient verbalized understanding, denies any further questions/concerns at this time.  Wells Guiles 'Learta CoddingNeysa Bonito, RN, BSN Clinical Research Nurse I 08/26/22 1:10 PM

## 2022-08-28 NOTE — Progress Notes (Signed)
 Patient Care Team: Paterson, Daniel G, MD as PCP - General (Internal Medicine) Branch, Mary E, MD as PCP - Cardiology (Cardiology) Stuart, Dawn C, RN as Oncology Nurse Navigator Martini, Keisha N, RN as Oncology Nurse Navigator Gudena, Vinay, MD as Consulting Physician (Hematology and Oncology) Wakefield, Matthew, MD as Consulting Physician (General Surgery)  DIAGNOSIS:  Encounter Diagnosis  Name Primary?   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  Multiple additional masses 1.1 and 0.9 cm.  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  Breast MRI showed at least 7 different masses largest 1.6 cm.  No lymphadenopathy   03/17/2022 Genetic Testing   Negative genetic testing through the [facility] performed at [facility].  The report date is Mar 17, 2022.  The CancerNext gene panel offered by [employer] includes sequencing and rearrangement analysis for the following 34 genes:   APC, ATM, BARD1, BMPR1A, BRCA1, BRCA2, BRIP1, CDH1, CDK4, CDKN2A, CHEK2, DICER1, HOXB13, EPCAM, GREM1, MLH1, MRE11A, MSH2, MSH6, MUTYH, NBN, NF1, PALB2, PMS2, POLD1, POLE, PTEN, RAD50, RAD51C, RAD51D, SMAD4, SMARCA4, STK11, and TP53.     03/18/2022 - 07/03/2022 Chemotherapy   Patient is on Treatment Plan : BREAST Docetaxel + Carboplatin + Trastuzumab (TCH) q21d / Trastuzumab q21d     03/18/2022 - 08/12/2022 Chemotherapy   Patient is on Treatment Plan : BREAST Docetaxel + Carboplatin + Trastuzumab (TCH) q21d / Trastuzumab q21d     09/02/2022 -  Chemotherapy   Patient is on Treatment Plan : BREAST ADO-Trastuzumab  Emtansine (Kadcyla) q21d       CHIEF COMPLIANT: Follow up left breast cancer starting Kadcyla cycle 1  INTERVAL HISTORY: Farrah M Quilling is a 41-year-old with above-mentioned history of of  left breast cancer on chemotherapy with cycle 1 Kadcyla. She presents to the clinic today for a follow-up and treatment. She reports that she has some itching under bra.S he believes she is allergic to adhesive tape. She also has hot flashes mainly at night. She states that she gets them frequently.   ALLERGIES:  has No Known Allergies.  MEDICATIONS:  Current Outpatient Medications  Medication Sig Dispense Refill   hydrOXYzine (ATARAX) 10 MG tablet Take 1 tablet (10 mg total) by mouth 3 (three) times daily as needed. 30 tablet 0   lidocaine-prilocaine (EMLA) cream Apply to affected area once 30 g 3   ondansetron (ZOFRAN) 8 MG tablet Take by mouth every 8 (eight) hours as needed for nausea or vomiting.     tranexamic acid (LYSTEDA) 650 MG TABS tablet Take 2 tablets (1,300 mg total) by mouth 3 (three) times daily. 30 tablet 1   venlafaxine XR (EFFEXOR-XR) 37.5 MG 24 hr capsule TAKE 1 CAPSULE BY MOUTH DAILY WITH BREAKFAST. 90 capsule 3   No current facility-administered medications for this visit.   Facility-Administered Medications Ordered in Other Visits  Medication Dose Route Frequency Provider Last Rate Last Admin   ado-trastuzumab emtansine (KADCYLA) 260 mg in sodium chloride 0.9 % 250 mL chemo infusion  3.6 mg/kg (Order-Specific) Intravenous Once Gudena, Vinay, MD 175 mL/hr at 09/02/22 1055 260 mg at 09/02/22 1055   heparin lock flush 100 unit/mL    500 Units Intracatheter Once PRN Nicholas Lose, MD       sodium chloride flush (NS) 0.9 % injection 10 mL  10 mL Intracatheter PRN Nicholas Lose, MD        PHYSICAL EXAMINATION: ECOG PERFORMANCE STATUS: 1 - Symptomatic but completely ambulatory  Vitals:   09/02/22 0936  BP: (!) 142/95  Pulse: 76  Resp: 16  Temp: 97.7 F (36.5 C)  SpO2: 99%    There were no vitals filed for this visit.    LABORATORY DATA:  I have reviewed the data as listed    Latest Ref Rng & Units 09/02/2022    8:39 AM 07/01/2022    9:13 AM 06/10/2022    7:55 AM  CMP  Glucose 70 - 99 mg/dL 104  99  100   BUN 6 - 20 mg/dL _0 Creatinine 0.44 - 1.00 mg/dL 0.91  0.84  0.85   Sodium 135 - 145 mmol/L 138  140  139   Potassium 3.5 - 5.1 mmol/L 4.1  3.3  3.3   Chloride 98 - 111 mmol/L 104  106  105   CO2 22 - 32 mmol/L _1 Calcium 8.9 - 10.3 mg/dL 10.0  9.8  9.3   Total Protein 6.5 - 8.1 g/dL 7.3  6.8  6.8   Total Bilirubin 0.3 - 1.2 mg/dL 0.5  0.6  0.9   Alkaline Phos 38 - 126 U/L 56  63  57   AST 15 - 41 U/L _2 ALT 0 - 44 U/L _3 Lab Results  Component Value Date   WBC 4.8 09/02/2022   HGB 12.7 09/02/2022   HCT 35.9 (L) 09/02/2022   MCV 100.0 09/02/2022   PLT 236 09/02/2022   NEUTROABS 2.3 09/02/2022    ASSESSMENT & PLAN:  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  Multiple additional masses 1.1 and 0.9 cm.  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  Breast MRI showed at least 7 different masses largest 1.6 cm.  No lymphadenopathy   Treatment plan: 1. Neoadjuvant chemotherapy with TCH  6 cycles completed 07/01/2022 followed byKadcyla maintenance started 09/02/2022 2. left mastectomy with sentinel lymph node study: 08/13/2022 ([facility]): Multifocal grade 2 IDC 8 mm, 5 mm, 6 mm, 0/4 lymph nodes, ER/PR positive HER2 positive, reconstruction 08/21/2022 3. Followed by adjuvant antiestrogen therapy -------------------------------------------------------------------------------------------------------------------------------------------- Current treatment: Kadcyla cycle 1, antiestrogen therapy with tamoxifen (will be  started when she comes back in 3 weeks) Patient was not eligible for [facility] HER2 clinical trial. Return to clinic in 3 weeks for cycle 2    No orders of the defined types were placed in this encounter.  The patient has a good understanding of the overall plan. she agrees with it. she will call with any problems that may develop before the next visit here. Total time spent: 30 mins including face to face time and time spent for planning, charting and co-ordination of care   Harriette Ohara, MD 09/02/22    I Gardiner Coins am scribing for Dr. Lindi Adie  I have reviewed the above documentation for accuracy and completeness, and I agree with the above.

## 2022-08-29 ENCOUNTER — Other Ambulatory Visit: Payer: Self-pay | Admitting: Hematology and Oncology

## 2022-09-02 ENCOUNTER — Inpatient Hospital Stay: Payer: 59

## 2022-09-02 ENCOUNTER — Inpatient Hospital Stay (HOSPITAL_BASED_OUTPATIENT_CLINIC_OR_DEPARTMENT_OTHER): Payer: 59 | Admitting: Hematology and Oncology

## 2022-09-02 VITALS — BP 140/90 | HR 68 | Temp 97.9°F | Resp 17 | Wt 165.0 lb

## 2022-09-02 DIAGNOSIS — Z5112 Encounter for antineoplastic immunotherapy: Secondary | ICD-10-CM | POA: Diagnosis not present

## 2022-09-02 DIAGNOSIS — C50012 Malignant neoplasm of nipple and areola, left female breast: Secondary | ICD-10-CM

## 2022-09-02 DIAGNOSIS — Z170 Estrogen receptor positive status [ER+]: Secondary | ICD-10-CM

## 2022-09-02 LAB — CMP (CANCER CENTER ONLY)
ALT: 28 U/L (ref 0–44)
AST: 26 U/L (ref 15–41)
Albumin: 4.2 g/dL (ref 3.5–5.0)
Alkaline Phosphatase: 56 U/L (ref 38–126)
Anion gap: 7 (ref 5–15)
BUN: 20 mg/dL (ref 6–20)
CO2: 27 mmol/L (ref 22–32)
Calcium: 10 mg/dL (ref 8.9–10.3)
Chloride: 104 mmol/L (ref 98–111)
Creatinine: 0.91 mg/dL (ref 0.44–1.00)
GFR, Estimated: >60 mL/min (ref >60)
Glucose, Bld: 104 mg/dL — ABNORMAL HIGH (ref 70–99)
Potassium: 4.1 mmol/L (ref 3.5–5.1)
Sodium: 138 mmol/L (ref 135–145)
Total Bilirubin: 0.5 mg/dL (ref 0.3–1.2)
Total Protein: 7.3 g/dL (ref 6.5–8.1)

## 2022-09-02 LAB — CBC WITH DIFFERENTIAL (CANCER CENTER ONLY)
Abs Immature Granulocytes: 0 10*3/uL (ref 0.00–0.07)
Basophils Absolute: 0 10*3/uL (ref 0.0–0.1)
Basophils Relative: 1 %
Eosinophils Absolute: 0.2 10*3/uL (ref 0.0–0.5)
Eosinophils Relative: 5 %
HCT: 35.9 % — ABNORMAL LOW (ref 36.0–46.0)
Hemoglobin: 12.7 g/dL (ref 12.0–15.0)
Immature Granulocytes: 0 %
Lymphocytes Relative: 41 %
Lymphs Abs: 1.9 10*3/uL (ref 0.7–4.0)
MCH: 35.4 pg — ABNORMAL HIGH (ref 26.0–34.0)
MCHC: 35.4 g/dL (ref 30.0–36.0)
MCV: 100 fL (ref 80.0–100.0)
Monocytes Absolute: 0.2 10*3/uL (ref 0.1–1.0)
Monocytes Relative: 5 %
Neutro Abs: 2.3 10*3/uL (ref 1.7–7.7)
Neutrophils Relative %: 48 %
Platelet Count: 236 10*3/uL (ref 150–400)
RBC: 3.59 MIL/uL — ABNORMAL LOW (ref 3.87–5.11)
RDW: 11.3 % — ABNORMAL LOW (ref 11.5–15.5)
WBC Count: 4.8 10*3/uL (ref 4.0–10.5)
nRBC: 0 % (ref 0.0–0.2)

## 2022-09-02 MED ORDER — ACETAMINOPHEN 325 MG PO TABS
650.0000 mg | ORAL_TABLET | Freq: Once | ORAL | Status: AC
  Administered 2022-09-02: 650 mg via ORAL
  Filled 2022-09-02: qty 2

## 2022-09-02 MED ORDER — HEPARIN SOD (PORK) LOCK FLUSH 100 UNIT/ML IV SOLN
500.0000 [IU] | Freq: Once | INTRAVENOUS | Status: AC | PRN
  Administered 2022-09-02: 500 [IU]

## 2022-09-02 MED ORDER — SODIUM CHLORIDE 0.9 % IV SOLN
3.6000 mg/kg | Freq: Once | INTRAVENOUS | Status: AC
  Administered 2022-09-02: 260 mg via INTRAVENOUS
  Filled 2022-09-02: qty 8

## 2022-09-02 MED ORDER — DIPHENHYDRAMINE HCL 25 MG PO CAPS
25.0000 mg | ORAL_CAPSULE | Freq: Once | ORAL | Status: AC
  Administered 2022-09-02: 25 mg via ORAL
  Filled 2022-09-02: qty 1

## 2022-09-02 MED ORDER — SODIUM CHLORIDE 0.9 % IV SOLN: Freq: Once | INTRAVENOUS | Status: AC

## 2022-09-02 MED ORDER — PROCHLORPERAZINE MALEATE 10 MG PO TABS
10.0000 mg | ORAL_TABLET | Freq: Once | ORAL | Status: AC
  Administered 2022-09-02: 10 mg via ORAL
  Filled 2022-09-02: qty 1

## 2022-09-02 MED ORDER — SODIUM CHLORIDE 0.9% FLUSH
10.0000 mL | INTRAVENOUS | Status: DC | PRN
  Administered 2022-09-02: 10 mL

## 2022-09-02 NOTE — Assessment & Plan Note (Signed)
03/04/2022 History of subpectoral implants, palpable left breast mass by ultrasound 1.4 cm. Multiple additional masses 1.1 and 0.9 cm. 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 Breast MRI showed at least 7 different masses largest 1.6 cm. No lymphadenopathy  Treatment plan: 1. Neoadjuvant chemotherapy with TCH 6 cycles completed 07/01/2022 followed byKadcyla maintenance started 09/02/2022 2. left mastectomywith sentinel lymph node study: 08/13/2022 ([facility]): Multifocal grade 2 IDC 8 mm, 5 mm, 6 mm, 0/4 lymph nodes, ER/PR positive HER2 positive, reconstruction 08/21/2022 3. Followed by adjuvantantiestrogen therapy -------------------------------------------------------------------------------------------------------------------------------------------- Current treatment: Kadcyla cycle 1, antiestrogen therapy with tamoxifen Patient was not eligible for [facility] HER2 clinical trial. Return to clinic in 3 weeks for cycle 2 

## 2022-09-02 NOTE — Patient Instructions (Signed)
[facility] MEDICAL [facility]COLOGY  Discharge Instructions: Thank you for choosing [facility] to provide your oncology and hematology care.   If you have a lab appointment with the Cancer Center, please go directly to the Cancer Center and check in at the registration area.   Wear comfortable clothing and clothing appropriate for easy access to any Portacath or PICC line.   We strive to give you quality time with your provider. You may need to reschedule your appointment if you arrive late (15 or more minutes).  Arriving late affects you and other patients whose appointments are after yours.  Also, if you miss three or more appointments without notifying the office, you may be dismissed from the clinic at the provider's discretion.      For prescription refill requests, have your pharmacy contact our office and allow 72 hours for refills to be completed.    Today you received the following chemotherapy and/or immunotherapy agents Kadcyla      To help prevent nausea and vomiting after your treatment, we encourage you to take your nausea medication as directed.  BELOW ARE SYMPTOMS THAT SHOULD BE REPORTED IMMEDIATELY: *FEVER GREATER THAN 100.4 F (38 C) OR HIGHER *CHILLS OR SWEATING *NAUSEA AND VOMITING THAT IS NOT CONTROLLED WITH YOUR NAUSEA MEDICATION *UNUSUAL SHORTNESS OF BREATH *UNUSUAL BRUISING OR BLEEDING *URINARY PROBLEMS (pain or burning when urinating, or frequent urination) *BOWEL PROBLEMS (unusual diarrhea, constipation, pain near the anus) TENDERNESS IN MOUTH AND THROAT WITH OR WITHOUT PRESENCE OF ULCERS (sore throat, sores in mouth, or a toothache) UNUSUAL RASH, SWELLING OR PAIN  UNUSUAL VAGINAL DISCHARGE OR ITCHING   Items with * indicate a potential emergency and should be followed up as soon as possible or go to the Emergency Department if any problems should occur.  Please show the CHEMOTHERAPY ALERT CARD or IMMUNOTHERAPY ALERT CARD at check-in to the  Emergency Department and triage nurse.  Should you have questions after your visit or need to cancel or reschedule your appointment, please contact [facility]  Dept: [phone]  and follow the prompts.  Office hours are 8:00 a.m. to 4:30 p.m. Monday - Friday. Please note that voicemails left after 4:00 p.m. may not be returned until the following business day.  We are closed weekends and major holidays. You have access to a nurse at all times for urgent questions. Please call the main number to the clinic Dept: [phone] and follow the prompts.   For any non-urgent questions, you may also contact your provider using MyChart. We now offer e-Visits for anyone 18 and older to request care online for non-urgent symptoms. For details visit [email or web address].   Also download the MyChart app! Go to the app store, search "MyChart", open the app, select [facility], and log in with your MyChart username and password.  Masks are optional in the cancer centers. If you would like for your care team to wear a mask while they are taking care of you, please let them know. You may have one support person who is at least 42 years old accompany you for your appointments.  Ado-Trastuzumab Emtansine Injection What is this medication? ADO-TRASTUZUMAB EMTANSINE (ADD oh traz TOO zuh mab em TAN zine) treats breast cancer. It works by blocking a protein that causes cancer cells to grow and multiply. This helps to slow or stop the spread of cancer cells. This medicine may be used for other purposes; ask your health care provider or pharmacist   if you have questions. COMMON BRAND NAME(S): Kadcyla What should I tell my care team before I take this medication? They need to know if you have any of these conditions: Heart failure Liver disease Low platelet levels Lung disease Tingling of the fingers or toes or other nerve disorder An unusual or allergic reaction to ado-trastuzumab  emtansine, other medications, foods, dyes, or preservatives Pregnant or trying to get pregnant Breast-feeding How should I use this medication? This medication is infused into a vein. It is given by your care team in a hospital or clinic setting. Talk to your care team about the use of this medication in children. Special care may be needed. Overdosage: If you think you have taken too much of this medicine contact a poison control center or emergency room at once. NOTE: This medicine is only for you. Do not share this medicine with others. What if I miss a dose? Keep appointments for follow-up doses. It is important not to miss your dose. Call your care team if you are unable to keep an appointment. What may interact with this medication? Atazanavir Boceprevir Clarithromycin Dalfopristin; quinupristin Delavirdine Indinavir Isoniazid, INH Itraconazole Ketoconazole Nefazodone Nelfinavir Ritonavir Telaprevir Telithromycin Tipranavir Voriconazole This list may not describe all possible interactions. Give your health care provider a list of all the medicines, herbs, non-prescription drugs, or dietary supplements you use. Also tell them if you smoke, drink alcohol, or use illegal drugs. Some items may interact with your medicine. What should I watch for while using this medication? This medication may make you feel generally unwell. This is not uncommon, as chemotherapy can affect healthy cells as well as cancer cells. Report any side effects. Continue your course of treatment even though you feel ill unless your care team tells you to stop. You may need blood work while taking this medication. This medication may increase your risk to bruise or bleed. Call your care team if you notice any unusual bleeding. Be careful brushing or flossing your teeth or using a toothpick because you may get an infection or bleed more easily. If you have any dental work done, tell your dentist you are  receiving this medication. Talk to your care team if you may be pregnant. Serious birth defects can occur if you take this medication during pregnancy and for 7 months after the last dose. You will need a negative pregnancy test before starting this medication. Contraception is recommended while taking this medication and for 7 months after the last dose. Your care team can help you find the option that works for you. If your partner can get pregnant, use a condom during sex while taking this medication and for 4 months after the last dose. Do not breastfeed while taking this medication and for 7 months after the last dose. This medication may cause infertility. Talk to your care team if you are concerned with your fertility. What side effects may I notice from receiving this medication? Side effects that you should report to your care team as soon as possible: Allergic reactions--skin rash, itching, hives, swelling of the face, lips, tongue, or throat Bleeding--bloody or black, tar-like stools, vomiting blood or brown material that looks like coffee grounds, red or dark brown urine, small red or purple spots on skin, unusual bruising or bleeding Dry cough, shortness of breath or trouble breathing Heart failure--shortness of breath, swelling of the ankles, feet, or hands, sudden weight gain, unusual weakness or fatigue Infusion reactions--chest pain, shortness of breath or trouble breathing,   feeling faint or lightheaded Liver injury--right upper belly pain, loss of appetite, nausea, light-colored stool, dark yellow or brown urine, yellowing skin or eyes, unusual weakness or fatigue Pain, tingling, or numbness in the hands or feet Painful swelling, warmth, or redness of the skin, blisters or sores at the infusion site Side effects that usually do not require medical attention (report to your care team if they continue or are bothersome): Constipation Fatigue Headache Muscle pain Nausea This list  may not describe all possible side effects. Call your doctor for medical advice about side effects. You may report side effects to FDA at 1-800-FDA-1088. Where should I keep my medication? This medication is given in a hospital or clinic. It will not be stored at home. NOTE: This sheet is a summary. It may not cover all possible information. If you have questions about this medicine, talk to your doctor, pharmacist, or health care provider.  2023 Elsevier/Gold Standard (2022-03-14 00:00:00) 

## 2022-09-03 ENCOUNTER — Telehealth: Payer: Self-pay

## 2022-09-03 NOTE — Telephone Encounter (Signed)
[name] states that she is doing fine.  She is eating,drinking, and urinating well. She knows to call the office at [phone] if she has any questions or concerns.

## 2022-09-03 NOTE — Telephone Encounter (Signed)
-----   Message from Charleston Poot, RN sent at 09/02/2022  2:12 PM EDT ----- Regarding: First time/ Kadcyla/ Dr Lindi Adie pt Hello,   Pt had first time kadcyla today. Tolerated well.  Thanks!

## 2022-09-07 ENCOUNTER — Encounter: Payer: Self-pay | Admitting: Hematology and Oncology
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
## 2022-09-10 ENCOUNTER — Telehealth: Payer: Self-pay | Admitting: *Deleted

## 2022-09-10 ENCOUNTER — Encounter: Payer: Self-pay | Admitting: Physical Therapy

## 2022-09-10 ENCOUNTER — Ambulatory Visit: Payer: 59 | Attending: Hematology and Oncology | Admitting: Physical Therapy

## 2022-09-10 ENCOUNTER — Telehealth: Payer: Self-pay | Admitting: Hematology and Oncology

## 2022-09-10 DIAGNOSIS — Z170 Estrogen receptor positive status [ER+]: Secondary | ICD-10-CM | POA: Diagnosis not present

## 2022-09-10 DIAGNOSIS — Z483 Aftercare following surgery for neoplasm: Secondary | ICD-10-CM | POA: Insufficient documentation

## 2022-09-10 DIAGNOSIS — C50012 Malignant neoplasm of nipple and areola, left female breast: Secondary | ICD-10-CM | POA: Diagnosis not present

## 2022-09-10 DIAGNOSIS — R293 Abnormal posture: Secondary | ICD-10-CM | POA: Insufficient documentation

## 2022-09-10 NOTE — Telephone Encounter (Signed)
Received call from pt with complaint of abdominal pain and burning 3 days post Kadcyla.  Per MD pt needing to start taking OTC Pepcid to alleviate acid reflux symptoms.  Pt educated to contact our office if symptoms do not resolve.  Pt verbalized understanding.

## 2022-09-10 NOTE — Telephone Encounter (Signed)
Scheduled appointment per WQ. Patient is aware. 

## 2022-09-11 ENCOUNTER — Other Ambulatory Visit: Payer: Self-pay

## 2022-09-11 ENCOUNTER — Ambulatory Visit: Payer: 59 | Admitting: Physical Therapy

## 2022-09-16 ENCOUNTER — Encounter: Payer: Self-pay | Admitting: Physical Therapy

## 2022-09-16 ENCOUNTER — Ambulatory Visit: Payer: 59 | Admitting: Physical Therapy

## 2022-09-16 DIAGNOSIS — R293 Abnormal posture: Secondary | ICD-10-CM

## 2022-09-16 DIAGNOSIS — Z483 Aftercare following surgery for neoplasm: Secondary | ICD-10-CM

## 2022-09-16 DIAGNOSIS — C50012 Malignant neoplasm of nipple and areola, left female breast: Secondary | ICD-10-CM | POA: Diagnosis not present

## 2022-09-16 DIAGNOSIS — Z170 Estrogen receptor positive status [ER+]: Secondary | ICD-10-CM
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
## 2022-09-17 ENCOUNTER — Encounter: Payer: Self-pay | Admitting: Physical Therapy

## 2022-09-17 ENCOUNTER — Ambulatory Visit: Payer: 59 | Admitting: Physical Therapy

## 2022-09-17 DIAGNOSIS — R293 Abnormal posture: Secondary | ICD-10-CM

## 2022-09-17 DIAGNOSIS — Z483 Aftercare following surgery for neoplasm: Secondary | ICD-10-CM

## 2022-09-17 DIAGNOSIS — Z170 Estrogen receptor positive status [ER+]: Secondary | ICD-10-CM

## 2022-09-17 DIAGNOSIS — C50012 Malignant neoplasm of nipple and areola, left female breast: Secondary | ICD-10-CM | POA: Diagnosis not present
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
## 2022-09-20 NOTE — Progress Notes (Signed)
Patient Care Team: Donnajean Lopes, MD as PCP - General (Internal Medicine) Janina Mayo, MD as PCP - Cardiology (Cardiology) Mauro Kaufmann, RN as Oncology Nurse Navigator Rockwell Germany, RN as Oncology Nurse Navigator Nicholas Lose, MD as Consulting Physician (Hematology and Oncology) Rolm Bookbinder, MD as Consulting Physician (General Surgery)  DIAGNOSIS: No diagnosis found.  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  Multiple additional masses 1.1 and 0.9 cm.  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  Breast MRI showed at least 7 different masses largest 1.6 cm.  No lymphadenopathy   03/17/2022 Genetic Testing   Negative genetic testing through the CancerNext performed at [facility] Surgery office.  The report date is Mar 17, 2022.  The CancerNext gene panel offered by [employer] includes sequencing and rearrangement analysis for the following 34 genes:   APC, ATM, BARD1, BMPR1A, BRCA1, BRCA2, BRIP1, CDH1, CDK4, CDKN2A, CHEK2, DICER1, HOXB13, EPCAM, GREM1, MLH1, MRE11A, MSH2, MSH6, MUTYH, NBN, NF1, PALB2, PMS2, POLD1, POLE, PTEN, RAD50, RAD51C, RAD51D, SMAD4, SMARCA4, STK11, and TP53.     03/18/2022 - 07/03/2022 Chemotherapy   Patient is on Treatment Plan : BREAST Docetaxel + Carboplatin + Trastuzumab ([place]) q21d / Trastuzumab q21d     03/18/2022 - 08/12/2022 Chemotherapy   Patient is on Treatment Plan : BREAST Docetaxel + Carboplatin + Trastuzumab ([place]) q21d / Trastuzumab q21d     09/02/2022 -  Chemotherapy   Patient is on Treatment Plan : BREAST ADO-Trastuzumab Emtansine (Kadcyla) q21d       CHIEF COMPLIANT: Follow-up Kadcyla cycle 2  INTERVAL HISTORY: [name] is a 41 y.o with left  breast estrogen receptor positive. Currently on antiestrogen therapy with tamoxifen. She reports to the clinic today for a follow-up and cycle 2 Kadcyla.   ALLERGIES:  has No Known Allergies.  MEDICATIONS:  Current Outpatient Medications  Medication Sig Dispense Refill   hydrOXYzine (ATARAX) 10 MG tablet Take 1 tablet (10 mg total) by mouth 3 (three) times daily as needed. 30 tablet 0   lidocaine-prilocaine (EMLA) cream Apply to affected area once 30 g 3   ondansetron (ZOFRAN) 8 MG tablet Take by mouth every 8 (eight) hours as needed for nausea or vomiting.     tranexamic acid (LYSTEDA) 650 MG TABS tablet Take 2 tablets (1,300 mg total) by mouth 3 (three) times daily. (Patient not taking: Reported on 09/10/2022) 30 tablet 1   venlafaxine XR (EFFEXOR-XR) 37.5 MG 24 hr capsule TAKE 1 CAPSULE BY MOUTH DAILY WITH BREAKFAST. 90 capsule 3   No current facility-administered medications for this visit.    PHYSICAL EXAMINATION: ECOG PERFORMANCE STATUS: {CHL ONC ECOG PS:[phone]}  There were no vitals filed for this visit. There were no vitals filed for this visit.  BREAST:*** No palpable masses or nodules in either right or left breasts. No palpable axillary supraclavicular or infraclavicular adenopathy no breast tenderness or nipple discharge. (exam performed in the presence of a chaperone)  LABORATORY DATA:  I have reviewed the data as listed    Latest Ref Rng & Units 09/02/2022    8:39 AM 07/01/2022    9:13 AM 06/10/2022    7:55 AM  CMP  Glucose 70 - 99 mg/dL 104  99  100  BUN 6 - 20 mg/dL _0 Creatinine 0.44 - 1.00 mg/dL 0.91  0.84  0.85   Sodium 135 - 145 mmol/L 138  140  139   Potassium 3.5 - 5.1 mmol/L 4.1  3.3  3.3   Chloride 98 - 111 mmol/L 104  106  105   CO2 22 - 32 mmol/L _1 Calcium 8.9 - 10.3 mg/dL 10.0  9.8  9.3   Total Protein 6.5 - 8.1 g/dL 7.3  6.8  6.8   Total Bilirubin 0.3 - 1.2 mg/dL 0.5  0.6  0.9   Alkaline Phos 38 - 126 U/L 56  63  57   AST  15 - 41 U/L _2 ALT 0 - 44 U/L _3 Lab Results  Component Value Date   WBC 4.8 09/02/2022   HGB 12.7 09/02/2022   HCT 35.9 (L) 09/02/2022   MCV 100.0 09/02/2022   PLT 236 09/02/2022   NEUTROABS 2.3 09/02/2022    ASSESSMENT & PLAN:  No problem-specific Assessment & Plan notes found for this encounter.    No orders of the defined types were placed in this encounter.  The patient has a good understanding of the overall plan. she agrees with it. she will call with any problems that may develop before the next visit here. Total time spent: 30 mins including face to face time and time spent for planning, charting and co-ordination of care   Suzzette Righter, [place] 09/20/22    I Gardiner Coins am scribing for Dr. Lindi Adie  ***

## 2022-09-22 ENCOUNTER — Ambulatory Visit: Payer: 59 | Admitting: Rehabilitation

## 2022-09-22 ENCOUNTER — Inpatient Hospital Stay: Payer: 59 | Attending: Hematology and Oncology | Admitting: Hematology and Oncology

## 2022-09-22 ENCOUNTER — Other Ambulatory Visit: Payer: 59

## 2022-09-22 ENCOUNTER — Inpatient Hospital Stay: Payer: 59

## 2022-09-22 VITALS — BP 128/87 | HR 68 | Temp 97.3°F | Resp 18 | Ht 68.0 in | Wt 169.5 lb

## 2022-09-22 VITALS — BP 120/83 | HR 63 | Resp 17

## 2022-09-22 DIAGNOSIS — Z95828 Presence of other vascular implants and grafts: Secondary | ICD-10-CM

## 2022-09-22 DIAGNOSIS — C50012 Malignant neoplasm of nipple and areola, left female breast: Secondary | ICD-10-CM

## 2022-09-22 DIAGNOSIS — Z170 Estrogen receptor positive status [ER+]: Secondary | ICD-10-CM | POA: Diagnosis not present

## 2022-09-22 DIAGNOSIS — Z5112 Encounter for antineoplastic immunotherapy: Secondary | ICD-10-CM | POA: Diagnosis not present

## 2022-09-22 DIAGNOSIS — Z9221 Personal history of antineoplastic chemotherapy: Secondary | ICD-10-CM | POA: Insufficient documentation

## 2022-09-22 DIAGNOSIS — L539 Erythematous condition, unspecified: Secondary | ICD-10-CM | POA: Insufficient documentation

## 2022-09-22 DIAGNOSIS — Z79899 Other long term (current) drug therapy: Secondary | ICD-10-CM | POA: Diagnosis not present

## 2022-09-22 LAB — CBC WITH DIFFERENTIAL (CANCER CENTER ONLY)
Abs Immature Granulocytes: 0.02 10*3/uL (ref 0.00–0.07)
Basophils Absolute: 0.1 10*3/uL (ref 0.0–0.1)
Basophils Relative: 1 %
Eosinophils Absolute: 0.2 10*3/uL (ref 0.0–0.5)
Eosinophils Relative: 3 %
HCT: 36 % (ref 36.0–46.0)
Hemoglobin: 12.7 g/dL (ref 12.0–15.0)
Immature Granulocytes: 0 %
Lymphocytes Relative: 33 %
Lymphs Abs: 2.5 10*3/uL (ref 0.7–4.0)
MCH: 35.2 pg — ABNORMAL HIGH (ref 26.0–34.0)
MCHC: 35.3 g/dL (ref 30.0–36.0)
MCV: 99.7 fL (ref 80.0–100.0)
Monocytes Absolute: 0.5 10*3/uL (ref 0.1–1.0)
Monocytes Relative: 6 %
Neutro Abs: 4.4 10*3/uL (ref 1.7–7.7)
Neutrophils Relative %: 57 %
Platelet Count: 290 10*3/uL (ref 150–400)
RBC: 3.61 MIL/uL — ABNORMAL LOW (ref 3.87–5.11)
RDW: 11.3 % — ABNORMAL LOW (ref 11.5–15.5)
WBC Count: 7.6 10*3/uL (ref 4.0–10.5)
nRBC: 0 % (ref 0.0–0.2)

## 2022-09-22 LAB — CMP (CANCER CENTER ONLY)
ALT: 35 U/L (ref 0–44)
AST: 26 U/L (ref 15–41)
Albumin: 4.3 g/dL (ref 3.5–5.0)
Alkaline Phosphatase: 66 U/L (ref 38–126)
Anion gap: 8 (ref 5–15)
BUN: 17 mg/dL (ref 6–20)
CO2: 29 mmol/L (ref 22–32)
Calcium: 10 mg/dL (ref 8.9–10.3)
Chloride: 101 mmol/L (ref 98–111)
Creatinine: 0.87 mg/dL (ref 0.44–1.00)
GFR, Estimated: >60 mL/min (ref >60)
Glucose, Bld: 96 mg/dL (ref 70–99)
Potassium: 4 mmol/L (ref 3.5–5.1)
Sodium: 138 mmol/L (ref 135–145)
Total Bilirubin: 0.5 mg/dL (ref 0.3–1.2)
Total Protein: 7.9 g/dL (ref 6.5–8.1)

## 2022-09-22 MED ORDER — SODIUM CHLORIDE 0.9 % IV SOLN: Freq: Once | INTRAVENOUS | Status: AC

## 2022-09-22 MED ORDER — SODIUM CHLORIDE 0.9 % IV SOLN
3.6000 mg/kg | Freq: Once | INTRAVENOUS | Status: AC
  Administered 2022-09-22: 260 mg via INTRAVENOUS
  Filled 2022-09-22: qty 8

## 2022-09-22 MED ORDER — HEPARIN SOD (PORK) LOCK FLUSH 100 UNIT/ML IV SOLN
500.0000 [IU] | Freq: Once | INTRAVENOUS | Status: AC | PRN
  Administered 2022-09-22: 500 [IU]

## 2022-09-22 MED ORDER — SODIUM CHLORIDE 0.9% FLUSH
10.0000 mL | Freq: Once | INTRAVENOUS | Status: AC
  Administered 2022-09-22: 10 mL

## 2022-09-22 MED ORDER — CETIRIZINE HCL 10 MG PO TABS
10.0000 mg | ORAL_TABLET | Freq: Once | ORAL | Status: AC
  Administered 2022-09-22: 10 mg via ORAL
  Filled 2022-09-22: qty 1

## 2022-09-22 MED ORDER — SODIUM CHLORIDE 0.9% FLUSH
10.0000 mL | INTRAVENOUS | Status: DC | PRN
  Administered 2022-09-22: 10 mL

## 2022-09-22 MED ORDER — ACETAMINOPHEN 325 MG PO TABS
650.0000 mg | ORAL_TABLET | Freq: Once | ORAL | Status: AC
  Administered 2022-09-22: 650 mg via ORAL
  Filled 2022-09-22: qty 2

## 2022-09-22 MED ORDER — DIPHENHYDRAMINE HCL 25 MG PO CAPS: 25.0000 mg | ORAL_CAPSULE | Freq: Once | ORAL | Status: DC

## 2022-09-22 MED ORDER — PROCHLORPERAZINE MALEATE 10 MG PO TABS
10.0000 mg | ORAL_TABLET | Freq: Once | ORAL | Status: AC
  Administered 2022-09-22: 10 mg via ORAL
  Filled 2022-09-22: qty 1

## 2022-09-22 NOTE — Patient Instructions (Signed)
[facility] MEDICAL ONCOLOGY  Discharge Instructions: Thank you for choosing [facility] to provide your oncology and hematology care.   If you have a lab appointment with the Cancer Center, please go directly to the Cancer Center and check in at the registration area.   Wear comfortable clothing and clothing appropriate for easy access to any Portacath or PICC line.   We strive to give you quality time with your provider. You may need to reschedule your appointment if you arrive late (15 or more minutes).  Arriving late affects you and other patients whose appointments are after yours.  Also, if you miss three or more appointments without notifying the office, you may be dismissed from the clinic at the provider's discretion.      For prescription refill requests, have your pharmacy contact our office and allow 72 hours for refills to be completed.    Today you received the following chemotherapy and/or immunotherapy agents : Kadcyla      To help prevent nausea and vomiting after your treatment, we encourage you to take your nausea medication as directed.  BELOW ARE SYMPTOMS THAT SHOULD BE REPORTED IMMEDIATELY: *FEVER GREATER THAN 100.4 F (38 C) OR HIGHER *CHILLS OR SWEATING *NAUSEA AND VOMITING THAT IS NOT CONTROLLED WITH YOUR NAUSEA MEDICATION *UNUSUAL SHORTNESS OF BREATH *UNUSUAL BRUISING OR BLEEDING *URINARY PROBLEMS (pain or burning when urinating, or frequent urination) *BOWEL PROBLEMS (unusual diarrhea, constipation, pain near the anus) TENDERNESS IN MOUTH AND THROAT WITH OR WITHOUT PRESENCE OF ULCERS (sore throat, sores in mouth, or a toothache) UNUSUAL RASH, SWELLING OR PAIN  UNUSUAL VAGINAL DISCHARGE OR ITCHING   Items with * indicate a potential emergency and should be followed up as soon as possible or go to the Emergency Department if any problems should occur.  Please show the CHEMOTHERAPY ALERT CARD or IMMUNOTHERAPY ALERT CARD at check-in to  the Emergency Department and triage nurse.  Should you have questions after your visit or need to cancel or reschedule your appointment, please contact [facility]ICAL ONCOLOG[facility]  Dept: [phone]  and follow the prompts.  Office hours are 8:00 a.m. to 4:30 p.m. Monday - Friday. Please note that voicemails left after 4:00 p.m. may not be returned until the following business day.  We are closed weekends and major holidays. You have access to a nurse at all times for urgent questions. Please call the main number to the clinic Dept: [phone] and follow the prompts.   For any non-urgent questions, you may also contact your provider using MyChart. We now offer e-Visits for anyone 18 and older to request care online for non-urgent symptoms. For details visit [email or web address].   Also download the MyChart app! Go to the app store, search "MyChart", open the app, select [facility], and log in with your MyChart username and password.  Masks are optional in the cancer centers. If you would like for your care team to wear a mask while they are taking care of you, please let them know. You may have one support person who is at least 42 years old accompany you for your appointments. 

## 2022-09-22 NOTE — Assessment & Plan Note (Addendum)
03/04/2022 History of subpectoral implants, palpable left breast mass by ultrasound 1.4 cm.  Multiple additional masses 1.1 and 0.9 cm.  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  Breast MRI showed at least 7 different masses largest 1.6 cm.  No lymphadenopathy   Treatment plan: 1. Neoadjuvant chemotherapy with TCH  6 cycles completed 07/01/2022 followed byKadcyla maintenance started 09/02/2022 2. left mastectomy with sentinel lymph node study: 08/13/2022 ([facility]): Multifocal grade 2 IDC 8 mm, 5 mm, 6 mm, 0/4 lymph nodes, ER/PR positive HER2 positive, reconstruction 08/21/2022 3. Followed by adjuvant antiestrogen therapy -------------------------------------------------------------------------------------------------------------------------------------------- Current treatment: Kadcyla cycle 2, antiestrogen therapy with tamoxifen (will be started in 3 weeks)  Kadcyla toxicities: 1 episode of nausea and feeling lack of appetite  Erythema of the left breast: Is unclear if it is cellulitis or inflammatory state.  She took ciprofloxacin and its appears to be getting better.  There is no fevers or leukocytosis.  Return to clinic in 3 weeks for cycle 3 and I will see her

## 2022-09-22 NOTE — Progress Notes (Signed)
Ok to treat with echo from 06/13/22 per Dr Lindi Adie

## 2022-09-22 NOTE — Progress Notes (Signed)
Dr. Lindi Adie would like to change patient from po benadryl to po cetirizine.  Larene Beach, PharmD

## 2022-09-22 NOTE — Progress Notes (Signed)
Pt declined to stay for entire 30 min post obs, observed for 20 min, discharged with VSS, ambulatory to lobby.

## 2022-09-23 ENCOUNTER — Ambulatory Visit: Payer: 59 | Admitting: Hematology and Oncology

## 2022-09-23 ENCOUNTER — Ambulatory Visit: Payer: 59

## 2022-09-26 ENCOUNTER — Encounter: Payer: Self-pay | Admitting: *Deleted

## 2022-09-29 ENCOUNTER — Encounter: Payer: Self-pay | Admitting: Physical Therapy

## 2022-09-29 ENCOUNTER — Ambulatory Visit: Payer: 59 | Admitting: Physical Therapy

## 2022-09-29 DIAGNOSIS — R293 Abnormal posture: Secondary | ICD-10-CM

## 2022-09-29 DIAGNOSIS — Z483 Aftercare following surgery for neoplasm: Secondary | ICD-10-CM

## 2022-09-29 DIAGNOSIS — C50012 Malignant neoplasm of nipple and areola, left female breast: Secondary | ICD-10-CM
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
## 2022-10-01 ENCOUNTER — Ambulatory Visit: Payer: 59

## 2022-10-01 DIAGNOSIS — C50012 Malignant neoplasm of nipple and areola, left female breast: Secondary | ICD-10-CM | POA: Diagnosis not present

## 2022-10-01 DIAGNOSIS — Z483 Aftercare following surgery for neoplasm: Secondary | ICD-10-CM

## 2022-10-01 DIAGNOSIS — R293 Abnormal posture: Secondary | ICD-10-CM
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
## 2022-10-07 ENCOUNTER — Ambulatory Visit (INDEPENDENT_AMBULATORY_CARE_PROVIDER_SITE_OTHER): Payer: 59 | Admitting: Obstetrics & Gynecology

## 2022-10-07 ENCOUNTER — Encounter (HOSPITAL_BASED_OUTPATIENT_CLINIC_OR_DEPARTMENT_OTHER): Payer: Self-pay | Admitting: Obstetrics & Gynecology

## 2022-10-07 ENCOUNTER — Ambulatory Visit: Payer: 59

## 2022-10-07 VITALS — BP 137/75 | HR 74 | Ht 68.5 in | Wt 166.0 lb

## 2022-10-07 DIAGNOSIS — Z01419 Encounter for gynecological examination (general) (routine) without abnormal findings: Secondary | ICD-10-CM | POA: Diagnosis not present

## 2022-10-07 DIAGNOSIS — C50012 Malignant neoplasm of nipple and areola, left female breast: Secondary | ICD-10-CM | POA: Diagnosis not present

## 2022-10-07 DIAGNOSIS — R293 Abnormal posture: Secondary | ICD-10-CM

## 2022-10-07 DIAGNOSIS — Z483 Aftercare following surgery for neoplasm: Secondary | ICD-10-CM

## 2022-10-07 DIAGNOSIS — Z87410 Personal history of cervical dysplasia: Secondary | ICD-10-CM

## 2022-10-07 DIAGNOSIS — R232 Flushing: Secondary | ICD-10-CM | POA: Diagnosis not present

## 2022-10-07 DIAGNOSIS — Z23 Encounter for immunization: Secondary | ICD-10-CM

## 2022-10-07 DIAGNOSIS — J00 Acute nasopharyngitis [common cold]: Secondary | ICD-10-CM | POA: Insufficient documentation

## 2022-10-07 DIAGNOSIS — Z170 Estrogen receptor positive status [ER+]: Secondary | ICD-10-CM

## 2022-10-07 MED ORDER — VEOZAH 45 MG PO TABS: 1.0000 | ORAL_TABLET | Freq: Every day | ORAL | 2 refills | Status: DC
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
## 2022-10-08 ENCOUNTER — Other Ambulatory Visit: Payer: Self-pay

## 2022-10-09 ENCOUNTER — Ambulatory Visit: Payer: 59 | Admitting: Physical Therapy

## 2022-10-09 ENCOUNTER — Encounter: Payer: Self-pay | Admitting: Physical Therapy

## 2022-10-09 DIAGNOSIS — C50012 Malignant neoplasm of nipple and areola, left female breast: Secondary | ICD-10-CM | POA: Diagnosis not present

## 2022-10-09 DIAGNOSIS — Z483 Aftercare following surgery for neoplasm: Secondary | ICD-10-CM

## 2022-10-09 DIAGNOSIS — R293 Abnormal posture: Secondary | ICD-10-CM
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
## 2022-10-10 ENCOUNTER — Telehealth: Payer: Self-pay | Admitting: Hematology and Oncology

## 2022-10-10 NOTE — Telephone Encounter (Signed)
Rescheduled and cancelled appointments per provider PAL. Patient is aware of the changes made to her upcoming appointment.

## 2022-10-10 NOTE — Progress Notes (Signed)
Patient Care Team: Donnajean Lopes, MD as PCP - General (Internal Medicine) Janina Mayo, MD as PCP - Cardiology (Cardiology) Mauro Kaufmann, RN as Oncology Nurse Navigator Rockwell Germany, RN as Oncology Nurse Navigator Nicholas Lose, MD as Consulting Physician (Hematology and Oncology) Rolm Bookbinder, MD as Consulting Physician (General Surgery)  DIAGNOSIS: No diagnosis found.  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  Multiple additional masses 1.1 and 0.9 cm.  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  Breast MRI showed at least 7 different masses largest 1.6 cm.  No lymphadenopathy   03/17/2022 Genetic Testing   Negative genetic testing through the CancerNext performed at [facility] Surgery office.  The report date is Mar 17, 2022.  The CancerNext gene panel offered by [employer] includes sequencing and rearrangement analysis for the following 34 genes:   APC, ATM, BARD1, BMPR1A, BRCA1, BRCA2, BRIP1, CDH1, CDK4, CDKN2A, CHEK2, DICER1, HOXB13, EPCAM, GREM1, MLH1, MRE11A, MSH2, MSH6, MUTYH, NBN, NF1, PALB2, PMS2, POLD1, POLE, PTEN, RAD50, RAD51C, RAD51D, SMAD4, SMARCA4, STK11, and TP53.     03/18/2022 - 07/03/2022 Chemotherapy   Patient is on Treatment Plan : BREAST Docetaxel + Carboplatin + Trastuzumab (TCH) q21d / Trastuzumab q21d     03/18/2022 - 08/12/2022 Chemotherapy   Patient is on Treatment Plan : BREAST Docetaxel + Carboplatin + Trastuzumab (TCH) q21d / Trastuzumab q21d     09/02/2022 -  Chemotherapy   Patient is on Treatment Plan : BREAST ADO-Trastuzumab Emtansine (Kadcyla) q21d       CHIEF COMPLIANT:   INTERVAL HISTORY: [name] is a   ALLERGIES:  is allergic to  sulfamethoxazole-trimethoprim and wound dressing adhesive.  MEDICATIONS:  Current Outpatient Medications  Medication Sig Dispense Refill   ALPRAZolam (XANAX) 0.25 MG tablet Take 0.25 mg by mouth.     Fezolinetant (VEOZAH) 45 MG TABS Take 1 tablet by mouth daily. 30 tablet 2   hydrOXYzine (ATARAX) 10 MG tablet Take 1 tablet (10 mg total) by mouth 3 (three) times daily as needed. 30 tablet 0   lidocaine-prilocaine (EMLA) cream Apply to affected area once 30 g 3   ondansetron (ZOFRAN) 8 MG tablet Take by mouth every 8 (eight) hours as needed for nausea or vomiting.     venlafaxine XR (EFFEXOR-XR) 37.5 MG 24 hr capsule TAKE 1 CAPSULE BY MOUTH DAILY WITH BREAKFAST. 90 capsule 3   No current facility-administered medications for this visit.    PHYSICAL EXAMINATION: ECOG PERFORMANCE STATUS: {CHL ONC ECOG PS:[id number]}  There were no vitals filed for this visit. There were no vitals filed for this visit.  BREAST:*** No palpable masses or nodules in either right or left breasts. No palpable axillary supraclavicular or infraclavicular adenopathy no breast tenderness or nipple discharge. (exam performed in the presence of a chaperone)  LABORATORY DATA:  I have reviewed the data as listed    Latest Ref Rng & Units 09/22/2022   10:33 AM 09/02/2022    8:39 AM 07/01/2022    9:13 AM  CMP  Glucose 70 - 99 mg/dL 96  104  99   BUN 6 - 20 mg/dL _0 Creatinine 0.44 - 1.00 mg/dL 0.87  0.91  0.84   Sodium 135 -  145 mmol/L 138  138  140   Potassium 3.5 - 5.1 mmol/L 4.0  4.1  3.3   Chloride 98 - 111 mmol/L 101  104  106   CO2 22 - 32 mmol/L _0 Calcium 8.9 - 10.3 mg/dL 10.0  10.0  9.8   Total Protein 6.5 - 8.1 g/dL 7.9  7.3  6.8   Total Bilirubin 0.3 - 1.2 mg/dL 0.5  0.5  0.6   Alkaline Phos 38 - 126 U/L 66  56  63   AST 15 - 41 U/L _1 ALT 0 - 44 U/L 35  28  31     Lab Results  Component Value Date   WBC 7.6 09/22/2022   HGB 12.7 09/22/2022   HCT 36.0 09/22/2022    MCV 99.7 09/22/2022   PLT 290 09/22/2022   NEUTROABS 4.4 09/22/2022    ASSESSMENT & PLAN:  No problem-specific Assessment & Plan notes found for this encounter.    No orders of the defined types were placed in this encounter.  The patient has a good understanding of the overall plan. she agrees with it. she will call with any problems that may develop before the next visit here. Total time spent: 30 mins including face to face time and time spent for planning, charting and co-ordination of care   Suzzette Righter, [place] 10/10/22    I Gardiner Coins am scribing for Dr. Lindi Adie  ***

## 2022-10-13 ENCOUNTER — Inpatient Hospital Stay (HOSPITAL_BASED_OUTPATIENT_CLINIC_OR_DEPARTMENT_OTHER): Payer: 59 | Admitting: Hematology and Oncology

## 2022-10-13 ENCOUNTER — Inpatient Hospital Stay: Payer: 59

## 2022-10-13 ENCOUNTER — Inpatient Hospital Stay: Payer: 59 | Attending: Hematology and Oncology

## 2022-10-13 ENCOUNTER — Encounter: Payer: Self-pay | Admitting: Hematology and Oncology

## 2022-10-13 ENCOUNTER — Other Ambulatory Visit: Payer: Self-pay | Admitting: Hematology and Oncology

## 2022-10-13 VITALS — BP 132/83 | HR 63 | Temp 98.1°F | Resp 18

## 2022-10-13 VITALS — BP 130/84 | HR 80 | Temp 97.7°F | Resp 18 | Ht 68.5 in | Wt 169.1 lb

## 2022-10-13 DIAGNOSIS — C50012 Malignant neoplasm of nipple and areola, left female breast: Secondary | ICD-10-CM | POA: Insufficient documentation

## 2022-10-13 DIAGNOSIS — Z95828 Presence of other vascular implants and grafts: Secondary | ICD-10-CM

## 2022-10-13 DIAGNOSIS — Z9012 Acquired absence of left breast and nipple: Secondary | ICD-10-CM | POA: Diagnosis not present

## 2022-10-13 DIAGNOSIS — I427 Cardiomyopathy due to drug and external agent: Secondary | ICD-10-CM | POA: Diagnosis not present

## 2022-10-13 DIAGNOSIS — Z5112 Encounter for antineoplastic immunotherapy: Secondary | ICD-10-CM | POA: Insufficient documentation

## 2022-10-13 DIAGNOSIS — Z170 Estrogen receptor positive status [ER+]: Secondary | ICD-10-CM | POA: Diagnosis not present

## 2022-10-13 DIAGNOSIS — G629 Polyneuropathy, unspecified: Secondary | ICD-10-CM | POA: Diagnosis not present

## 2022-10-13 LAB — CMP (CANCER CENTER ONLY)
ALT: 31 U/L (ref 0–44)
AST: 32 U/L (ref 15–41)
Albumin: 4.4 g/dL (ref 3.5–5.0)
Alkaline Phosphatase: 59 U/L (ref 38–126)
Anion gap: 7 (ref 5–15)
BUN: 20 mg/dL (ref 6–20)
CO2: 29 mmol/L (ref 22–32)
Calcium: 10.2 mg/dL (ref 8.9–10.3)
Chloride: 103 mmol/L (ref 98–111)
Creatinine: 0.83 mg/dL (ref 0.44–1.00)
GFR, Estimated: >60 mL/min (ref >60)
Glucose, Bld: 95 mg/dL (ref 70–99)
Potassium: 3.7 mmol/L (ref 3.5–5.1)
Sodium: 139 mmol/L (ref 135–145)
Total Bilirubin: 0.5 mg/dL (ref 0.3–1.2)
Total Protein: 7.8 g/dL (ref 6.5–8.1)

## 2022-10-13 LAB — CBC WITH DIFFERENTIAL (CANCER CENTER ONLY)
Abs Immature Granulocytes: 0.01 10*3/uL (ref 0.00–0.07)
Basophils Absolute: 0.1 10*3/uL (ref 0.0–0.1)
Basophils Relative: 1 %
Eosinophils Absolute: 0.3 10*3/uL (ref 0.0–0.5)
Eosinophils Relative: 4 %
HCT: 37.1 % (ref 36.0–46.0)
Hemoglobin: 13.2 g/dL (ref 12.0–15.0)
Immature Granulocytes: 0 %
Lymphocytes Relative: 49 %
Lymphs Abs: 3 10*3/uL (ref 0.7–4.0)
MCH: 34.5 pg — ABNORMAL HIGH (ref 26.0–34.0)
MCHC: 35.6 g/dL (ref 30.0–36.0)
MCV: 96.9 fL (ref 80.0–100.0)
Monocytes Absolute: 0.4 10*3/uL (ref 0.1–1.0)
Monocytes Relative: 7 %
Neutro Abs: 2.4 10*3/uL (ref 1.7–7.7)
Neutrophils Relative %: 39 %
Platelet Count: 236 10*3/uL (ref 150–400)
RBC: 3.83 MIL/uL — ABNORMAL LOW (ref 3.87–5.11)
RDW: 11.7 % (ref 11.5–15.5)
WBC Count: 6.2 10*3/uL (ref 4.0–10.5)
nRBC: 0 % (ref 0.0–0.2)

## 2022-10-13 MED ORDER — SODIUM CHLORIDE 0.9% FLUSH
10.0000 mL | INTRAVENOUS | Status: DC | PRN
  Administered 2022-10-13: 10 mL

## 2022-10-13 MED ORDER — ACETAMINOPHEN 325 MG PO TABS
650.0000 mg | ORAL_TABLET | Freq: Once | ORAL | Status: AC
  Administered 2022-10-13: 650 mg via ORAL
  Filled 2022-10-13: qty 2

## 2022-10-13 MED ORDER — SODIUM CHLORIDE 0.9% FLUSH
10.0000 mL | Freq: Once | INTRAVENOUS | Status: AC
  Administered 2022-10-13: 10 mL

## 2022-10-13 MED ORDER — SODIUM CHLORIDE 0.9 % IV SOLN: Freq: Once | INTRAVENOUS | Status: AC

## 2022-10-13 MED ORDER — DIPHENHYDRAMINE HCL 25 MG PO CAPS: 25.0000 mg | ORAL_CAPSULE | Freq: Once | ORAL | Status: DC

## 2022-10-13 MED ORDER — CETIRIZINE HCL 10 MG PO TABS
10.0000 mg | ORAL_TABLET | Freq: Once | ORAL | Status: AC
  Administered 2022-10-13: 10 mg via ORAL
  Filled 2022-10-13: qty 1

## 2022-10-13 MED ORDER — PROCHLORPERAZINE MALEATE 10 MG PO TABS
10.0000 mg | ORAL_TABLET | Freq: Once | ORAL | Status: AC
  Administered 2022-10-13: 10 mg via ORAL
  Filled 2022-10-13: qty 1

## 2022-10-13 MED ORDER — SODIUM CHLORIDE 0.9 % IV SOLN
3.6000 mg/kg | Freq: Once | INTRAVENOUS | Status: AC
  Administered 2022-10-13: 260 mg via INTRAVENOUS
  Filled 2022-10-13: qty 8

## 2022-10-13 MED ORDER — HEPARIN SOD (PORK) LOCK FLUSH 100 UNIT/ML IV SOLN
500.0000 [IU] | Freq: Once | INTRAVENOUS | Status: AC | PRN
  Administered 2022-10-13: 500 [IU]

## 2022-10-13 NOTE — Patient Instructions (Signed)
[facility]COLOGY  Discharge Instructions: Thank you for choosing [facility] to provide your oncology and hematology care.   If you have a lab appointment with the Cancer Center, please go directly to the Cancer Center and check in at the registration area.   Wear comfortable clothing and clothing appropriate for easy access to any Portacath or PICC line.   We strive to give you quality time with your provider. You may need to reschedule your appointment if you arrive late (15 or more minutes).  Arriving late affects you and other patients whose appointments are after yours.  Also, if you miss three or more appointments without notifying the office, you may be dismissed from the clinic at the provider's discretion.      For prescription refill requests, have your pharmacy contact our office and allow 72 hours for refills to be completed.    Today you received the following chemotherapy and/or immunotherapy agents : Kadcyla      To help prevent nausea and vomiting after your treatment, we encourage you to take your nausea medication as directed.  BELOW ARE SYMPTOMS THAT SHOULD BE REPORTED IMMEDIATELY: *FEVER GREATER THAN 100.4 F (38 C) OR HIGHER *CHILLS OR SWEATING *NAUSEA AND VOMITING THAT IS NOT CONTROLLED WITH YOUR NAUSEA MEDICATION *UNUSUAL SHORTNESS OF BREATH *UNUSUAL BRUISING OR BLEEDING *URINARY PROBLEMS (pain or burning when urinating, or frequent urination) *BOWEL PROBLEMS (unusual diarrhea, constipation, pain near the anus) TENDERNESS IN MOUTH AND THROAT WITH OR WITHOUT PRESENCE OF ULCERS (sore throat, sores in mouth, or a toothache) UNUSUAL RASH, SWELLING OR PAIN  UNUSUAL VAGINAL DISCHARGE OR ITCHING   Items with * indicate a potential emergency and should be followed up as soon as possible or go to the Emergency Department if any problems should occur.  Please show the CHEMOTHERAPY ALERT CARD or IMMUNOTHERAPY ALERT CARD at check-in to  the Emergency Department and triage nurse.  Should you have questions after your visit or need to cancel or reschedule your appointment, please contact [facility]  Dept: [phone]  and follow the prompts.  Office hours are 8:00 a.m. to 4:30 p.m. Monday - Friday. Please note that voicemails left after 4:00 p.m. may not be returned until the following business day.  We are closed weekends and major holidays. You have access to a nurse at all times for urgent questions. Please call the main number to the clinic Dept: [phone] and follow the prompts.   For any non-urgent questions, you may also contact your provider using MyChart. We now offer e-Visits for anyone 18 and older to request care online for non-urgent symptoms. For details visit [email or web address].   Also download the MyChart app! Go to the app store, search "MyChart", open the app, select [facility], and log in with your MyChart username and password.  Masks are optional in the cancer centers. If you would like for your care team to wear a mask while they are taking care of you, please let them know. You may have one support person who is at least 42 years old accompany you for your appointments. 

## 2022-10-13 NOTE — Progress Notes (Signed)
Ok to treat with the ECHO from 06/13/22 per Dr. Lindi Adie.  Patient tolerated her treatment well- stayed for a few minutes and declined the full 30 minute observation. VSS- BP 132/83 (BP Location: Right Arm, Patient Position: Sitting)   Pulse 63   Temp 98.1 F (36.7 C) (Oral)   Resp 18   SpO2 100%

## 2022-10-13 NOTE — Assessment & Plan Note (Signed)
03/04/2022 History of subpectoral implants, palpable left breast mass by ultrasound 1.4 cm.  Multiple additional masses 1.1 and 0.9 cm.  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  Breast MRI showed at least 7 different masses largest 1.6 cm.  No lymphadenopathy   Treatment plan: 1. Neoadjuvant chemotherapy with TCH  6 cycles completed 07/01/2022 followed byKadcyla maintenance started 09/02/2022 2. left mastectomy with sentinel lymph node study: 08/13/2022 ([facility]): Multifocal grade 2 IDC 8 mm, 5 mm, 6 mm, 0/4 lymph nodes, ER/PR positive HER2 positive, reconstruction 08/21/2022 3. Followed by adjuvant antiestrogen therapy -------------------------------------------------------------------------------------------------------------------------------------------- Current treatment: Kadcyla cycle 3, antiestrogen therapy with tamoxifen (will be started in 3 weeks)   Kadcyla toxicities: 1 episode of nausea and feeling lack of appetite   Erythema of the left breast: Is unclear if it is cellulitis or inflammatory state.  She took ciprofloxacin and its appears to be getting better.  There is no fevers or leukocytosis. Tomorrow she goes to [facility] to attend a concert by pink  Return to clinic in 3 weeks for cycle 4

## 2022-10-13 NOTE — Addendum Note (Signed)
Addended by: Suzzette Righter on: 10/13/2022 09:53 AM   Modules accepted: Orders

## 2022-10-14 ENCOUNTER — Ambulatory Visit (HOSPITAL_COMMUNITY): Payer: 59 | Attending: Internal Medicine

## 2022-10-14 ENCOUNTER — Other Ambulatory Visit: Payer: Self-pay | Admitting: *Deleted

## 2022-10-14 DIAGNOSIS — I427 Cardiomyopathy due to drug and external agent: Secondary | ICD-10-CM | POA: Diagnosis not present

## 2022-10-14 LAB — ECHOCARDIOGRAM LIMITED
Area-P 1/2: 2.61 cm2
P 1/2 time: 491 msec
S' Lateral: 3 cm

## 2022-10-14 MED ORDER — TAMOXIFEN CITRATE 20 MG PO TABS: 20.0000 mg | ORAL_TABLET | Freq: Every day | ORAL | 3 refills | Status: DC

## 2022-10-14 NOTE — Progress Notes (Signed)
Verbal orders received from MD for pt to start Tamoxifen 20 mg p.o daily.  Prescription sent to pharmacy on file, pt educated and verbalized understanding.

## 2022-10-15 ENCOUNTER — Other Ambulatory Visit: Payer: Self-pay | Admitting: *Deleted

## 2022-10-15 DIAGNOSIS — I427 Cardiomyopathy due to drug and external agent: Secondary | ICD-10-CM

## 2022-10-28 ENCOUNTER — Encounter: Payer: Self-pay | Admitting: Hematology and Oncology

## 2022-10-29 ENCOUNTER — Telehealth: Payer: Self-pay

## 2022-10-29 NOTE — Telephone Encounter (Signed)
Placed call to pt regarding MyChart message. Pt states joints in her fingers are swollen and she feels her "knuckles are locked" and reports new shoulder pain she never has had. Pt started Tamoxifen 10/14/22. Pr MD, stop Tamoxifen and come in 11/13/22 for f/u to discuss. Pt accepted  appt with Neva Seat, NP and knows to call with any further concerns in the interim.

## 2022-11-04 ENCOUNTER — Ambulatory Visit: Payer: 59

## 2022-11-04 ENCOUNTER — Inpatient Hospital Stay: Payer: 59

## 2022-11-04 ENCOUNTER — Ambulatory Visit: Payer: 59 | Admitting: Hematology and Oncology

## 2022-11-04 VITALS — BP 119/83 | HR 61 | Temp 98.1°F | Resp 18 | Wt 171.0 lb

## 2022-11-04 DIAGNOSIS — Z170 Estrogen receptor positive status [ER+]: Secondary | ICD-10-CM

## 2022-11-04 DIAGNOSIS — Z5112 Encounter for antineoplastic immunotherapy: Secondary | ICD-10-CM | POA: Diagnosis not present

## 2022-11-04 DIAGNOSIS — Z95828 Presence of other vascular implants and grafts: Secondary | ICD-10-CM

## 2022-11-04 LAB — CMP (CANCER CENTER ONLY)
ALT: 21 U/L (ref 0–44)
AST: 29 U/L (ref 15–41)
Albumin: 4.1 g/dL (ref 3.5–5.0)
Alkaline Phosphatase: 63 U/L (ref 38–126)
Anion gap: 6 (ref 5–15)
BUN: 22 mg/dL — ABNORMAL HIGH (ref 6–20)
CO2: 28 mmol/L (ref 22–32)
Calcium: 9.8 mg/dL (ref 8.9–10.3)
Chloride: 103 mmol/L (ref 98–111)
Creatinine: 0.96 mg/dL (ref 0.44–1.00)
GFR, Estimated: >60 mL/min (ref >60)
Glucose, Bld: 120 mg/dL — ABNORMAL HIGH (ref 70–99)
Potassium: 3.6 mmol/L (ref 3.5–5.1)
Sodium: 137 mmol/L (ref 135–145)
Total Bilirubin: 0.6 mg/dL (ref 0.3–1.2)
Total Protein: 7.5 g/dL (ref 6.5–8.1)

## 2022-11-04 LAB — CBC WITH DIFFERENTIAL (CANCER CENTER ONLY)
Abs Immature Granulocytes: 0.01 10*3/uL (ref 0.00–0.07)
Basophils Absolute: 0.1 10*3/uL (ref 0.0–0.1)
Basophils Relative: 2 %
Eosinophils Absolute: 0.3 10*3/uL (ref 0.0–0.5)
Eosinophils Relative: 5 %
HCT: 37.4 % (ref 36.0–46.0)
Hemoglobin: 12.8 g/dL (ref 12.0–15.0)
Immature Granulocytes: 0 %
Lymphocytes Relative: 49 %
Lymphs Abs: 2.6 10*3/uL (ref 0.7–4.0)
MCH: 32.9 pg (ref 26.0–34.0)
MCHC: 34.2 g/dL (ref 30.0–36.0)
MCV: 96.1 fL (ref 80.0–100.0)
Monocytes Absolute: 0.4 10*3/uL (ref 0.1–1.0)
Monocytes Relative: 7 %
Neutro Abs: 2 10*3/uL (ref 1.7–7.7)
Neutrophils Relative %: 37 %
Platelet Count: 227 10*3/uL (ref 150–400)
RBC: 3.89 MIL/uL (ref 3.87–5.11)
RDW: 12.2 % (ref 11.5–15.5)
WBC Count: 5.3 10*3/uL (ref 4.0–10.5)
nRBC: 0 % (ref 0.0–0.2)

## 2022-11-04 MED ORDER — SODIUM CHLORIDE 0.9% FLUSH
10.0000 mL | Freq: Once | INTRAVENOUS | Status: AC
  Administered 2022-11-04: 10 mL

## 2022-11-04 MED ORDER — CETIRIZINE HCL 10 MG PO TABS
10.0000 mg | ORAL_TABLET | Freq: Once | ORAL | Status: AC
  Administered 2022-11-04: 10 mg via ORAL
  Filled 2022-11-04: qty 1

## 2022-11-04 MED ORDER — HEPARIN SOD (PORK) LOCK FLUSH 100 UNIT/ML IV SOLN
500.0000 [IU] | Freq: Once | INTRAVENOUS | Status: AC | PRN
  Administered 2022-11-04: 500 [IU]

## 2022-11-04 MED ORDER — SODIUM CHLORIDE 0.9% FLUSH
10.0000 mL | INTRAVENOUS | Status: DC | PRN
  Administered 2022-11-04: 10 mL

## 2022-11-04 MED ORDER — ACETAMINOPHEN 325 MG PO TABS
650.0000 mg | ORAL_TABLET | Freq: Once | ORAL | Status: AC
  Administered 2022-11-04: 650 mg via ORAL
  Filled 2022-11-04: qty 2

## 2022-11-04 MED ORDER — SODIUM CHLORIDE 0.9 % IV SOLN
3.6000 mg/kg | Freq: Once | INTRAVENOUS | Status: AC
  Administered 2022-11-04: 260 mg via INTRAVENOUS
  Filled 2022-11-04: qty 8

## 2022-11-04 MED ORDER — PROCHLORPERAZINE MALEATE 10 MG PO TABS
10.0000 mg | ORAL_TABLET | Freq: Once | ORAL | Status: AC
  Administered 2022-11-04: 10 mg via ORAL
  Filled 2022-11-04: qty 1

## 2022-11-04 MED ORDER — SODIUM CHLORIDE 0.9 % IV SOLN: Freq: Once | INTRAVENOUS | Status: AC

## 2022-11-04 NOTE — Progress Notes (Signed)
Patient declined to stay for 30 minutes following administration of kadcyla infusion. Vital signs retaken and remained stable. Patient showed no signs of distress upon discharge.

## 2022-11-04 NOTE — Patient Instructions (Signed)
[facility] ONCOLOG[facility]   Discharge Instructions: Thank you for choosing [facility] to provide your oncology and hematology care.   If you have a lab appointment with the [facility], please go directly to the [facility] and check in at the registration area.   Wear comfortable clothing and clothing appropriate for easy access to any Portacath or PICC line.   We strive to give you quality time with your provider. You may need to reschedule your appointment if you arrive late (15 or more minutes).  Arriving late affects you and other patients whose appointments are after yours.  Also, if you miss three or more appointments without notifying the office, you may be dismissed from the clinic at the provider's discretion.      For prescription refill requests, have your pharmacy contact our office and allow 72 hours for refills to be completed.    Today you received the following chemotherapy and/or immunotherapy agents: Ado-Trastuzumab Emtansine (Kadcyla)      To help prevent nausea and vomiting after your treatment, we encourage you to take your nausea medication as directed.  BELOW ARE SYMPTOMS THAT SHOULD BE REPORTED IMMEDIATELY: *FEVER GREATER THAN 100.4 F (38 C) OR HIGHER *CHILLS OR SWEATING *NAUSEA AND VOMITING THAT IS NOT CONTROLLED WITH YOUR NAUSEA MEDICATION *UNUSUAL SHORTNESS OF BREATH *UNUSUAL BRUISING OR BLEEDING *URINARY PROBLEMS (pain or burning when urinating, or frequent urination) *BOWEL PROBLEMS (unusual diarrhea, constipation, pain near the anus) TENDERNESS IN MOUTH AND THROAT WITH OR WITHOUT PRESENCE OF ULCERS (sore throat, sores in mouth, or a toothache) UNUSUAL RASH, SWELLING OR PAIN  UNUSUAL VAGINAL DISCHARGE OR ITCHING   Items with * indicate a potential emergency and should be followed up as soon as possible or go to the Emergency Department if any problems should occur.  Please show the CHEMOTHERAPY ALERT CARD or  IMMUNOTHERAPY ALERT CARD at check-in to the Emergency Department and triage nurse.  Should you have questions after your visit or need to cancel or reschedule your appointment, please contact [facility]  Dept: [phone]  and follow the prompts.  Office hours are 8:00 a.m. to 4:30 p.m. Monday - Friday. Please note that voicemails left after 4:00 p.m. may not be returned until the following business day.  We are closed weekends and major holidays. You have access to a nurse at all times for urgent questions. Please call the main number to the clinic Dept: [phone] and follow the prompts.   For any non-urgent questions, you may also contact your provider using MyChart. We now offer e-Visits for anyone 55 and older to request care online for non-urgent symptoms. For details visit [email or web address].   Also download the MyChart app! Go to the app store, search "MyChart", open the app, select [facility], and log in with your MyChart username and password.  Masks are optional in the cancer centers. If you would like for your care team to wear a mask while they are taking care of you, please let them know. You may have one support person who is at least 42 years old accompany you for your appointments.

## 2022-11-13 ENCOUNTER — Encounter: Payer: Self-pay | Admitting: Adult Health

## 2022-11-13 ENCOUNTER — Other Ambulatory Visit: Payer: Self-pay

## 2022-11-13 ENCOUNTER — Inpatient Hospital Stay: Payer: 59 | Attending: Hematology and Oncology | Admitting: Adult Health

## 2022-11-13 VITALS — BP 123/75 | HR 87 | Temp 97.3°F | Resp 19 | Wt 170.4 lb

## 2022-11-13 DIAGNOSIS — C50012 Malignant neoplasm of nipple and areola, left female breast: Secondary | ICD-10-CM | POA: Diagnosis not present

## 2022-11-13 DIAGNOSIS — C50912 Malignant neoplasm of unspecified site of left female breast: Secondary | ICD-10-CM | POA: Diagnosis not present

## 2022-11-13 DIAGNOSIS — Z170 Estrogen receptor positive status [ER+]: Secondary | ICD-10-CM | POA: Diagnosis not present

## 2022-11-13 DIAGNOSIS — M7989 Other specified soft tissue disorders: Secondary | ICD-10-CM | POA: Diagnosis not present

## 2022-11-13 DIAGNOSIS — Z5112 Encounter for antineoplastic immunotherapy: Secondary | ICD-10-CM | POA: Diagnosis present

## 2022-11-13 DIAGNOSIS — Z79810 Long term (current) use of selective estrogen receptor modulators (SERMs): Secondary | ICD-10-CM | POA: Diagnosis not present
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
## 2022-11-14 ENCOUNTER — Encounter: Payer: Self-pay | Admitting: Hematology and Oncology

## 2022-11-14 NOTE — Assessment & Plan Note (Addendum)
[name] is a 42 year old woman with stage Ia triple positive breast cancer diagnosed in April 2023 status post neoadjuvant chemotherapy, mastectomy showing residual disease, followed by Kadcyla therapy.  She began on tamoxifen about 4 weeks ago.  [name] has really struggled with taking tamoxifen and the side effects that she is having from tamoxifen as compared to before she started taking it.  In reviewing potential interactions between tamoxifen and Kadcyla they both do go through the CYP 3 A4 pathway in the liver.  I let her know that sometimes patients can be more sensitive to taking both these at once than others.  We discussed that 1 option is for her to stay off the tamoxifen and continue on Kadcyla and revisit restarting tamoxifen once she has completed the Kadcyla.  She will prefers this approach and she will continue off tamoxifen for the time being.  We will see her back in 2 weeks for labs, follow-up, and her next Kadcyla.

## 2022-11-24 ENCOUNTER — Telehealth: Payer: Self-pay | Admitting: Hematology and Oncology

## 2022-11-24 NOTE — Telephone Encounter (Signed)
Scheduled appointments per WQ. Left voicemail for patient.

## 2022-11-25 ENCOUNTER — Inpatient Hospital Stay: Payer: 59

## 2022-11-25 ENCOUNTER — Inpatient Hospital Stay (HOSPITAL_BASED_OUTPATIENT_CLINIC_OR_DEPARTMENT_OTHER): Payer: 59 | Admitting: Hematology and Oncology

## 2022-11-25 VITALS — BP 128/81 | HR 65 | Temp 97.5°F | Resp 13 | Wt 173.2 lb

## 2022-11-25 DIAGNOSIS — Z170 Estrogen receptor positive status [ER+]: Secondary | ICD-10-CM

## 2022-11-25 DIAGNOSIS — C50012 Malignant neoplasm of nipple and areola, left female breast: Secondary | ICD-10-CM | POA: Diagnosis not present

## 2022-11-25 DIAGNOSIS — Z5112 Encounter for antineoplastic immunotherapy: Secondary | ICD-10-CM | POA: Diagnosis not present

## 2022-11-25 DIAGNOSIS — Z95828 Presence of other vascular implants and grafts: Secondary | ICD-10-CM

## 2022-11-25 LAB — CBC WITH DIFFERENTIAL (CANCER CENTER ONLY)
Abs Immature Granulocytes: 0 10*3/uL (ref 0.00–0.07)
Basophils Absolute: 0 10*3/uL (ref 0.0–0.1)
Basophils Relative: 1 %
Eosinophils Absolute: 0.3 10*3/uL (ref 0.0–0.5)
Eosinophils Relative: 5 %
HCT: 36.1 % (ref 36.0–46.0)
Hemoglobin: 12.7 g/dL (ref 12.0–15.0)
Immature Granulocytes: 0 %
Lymphocytes Relative: 50 %
Lymphs Abs: 2.6 10*3/uL (ref 0.7–4.0)
MCH: 33.3 pg (ref 26.0–34.0)
MCHC: 35.2 g/dL (ref 30.0–36.0)
MCV: 94.8 fL (ref 80.0–100.0)
Monocytes Absolute: 0.3 10*3/uL (ref 0.1–1.0)
Monocytes Relative: 7 %
Neutro Abs: 1.9 10*3/uL (ref 1.7–7.7)
Neutrophils Relative %: 37 %
Platelet Count: 187 10*3/uL (ref 150–400)
RBC: 3.81 MIL/uL — ABNORMAL LOW (ref 3.87–5.11)
RDW: 12.7 % (ref 11.5–15.5)
WBC Count: 5.1 10*3/uL (ref 4.0–10.5)
nRBC: 0 % (ref 0.0–0.2)

## 2022-11-25 LAB — CMP (CANCER CENTER ONLY)
ALT: 32 U/L (ref 0–44)
AST: 40 U/L (ref 15–41)
Albumin: 4 g/dL (ref 3.5–5.0)
Alkaline Phosphatase: 56 U/L (ref 38–126)
Anion gap: 7 (ref 5–15)
BUN: 19 mg/dL (ref 6–20)
CO2: 28 mmol/L (ref 22–32)
Calcium: 9.7 mg/dL (ref 8.9–10.3)
Chloride: 103 mmol/L (ref 98–111)
Creatinine: 0.9 mg/dL (ref 0.44–1.00)
GFR, Estimated: >60 mL/min (ref >60)
Glucose, Bld: 123 mg/dL — ABNORMAL HIGH (ref 70–99)
Potassium: 3.3 mmol/L — ABNORMAL LOW (ref 3.5–5.1)
Sodium: 138 mmol/L (ref 135–145)
Total Bilirubin: 0.6 mg/dL (ref 0.3–1.2)
Total Protein: 7.2 g/dL (ref 6.5–8.1)

## 2022-11-25 MED ORDER — PROCHLORPERAZINE MALEATE 10 MG PO TABS: 10.0000 mg | ORAL_TABLET | Freq: Four times a day (QID) | ORAL | 3 refills | Status: DC | PRN

## 2022-11-25 MED ORDER — SODIUM CHLORIDE 0.9% FLUSH
10.0000 mL | Freq: Once | INTRAVENOUS | Status: AC
  Administered 2022-11-25: 10 mL

## 2022-11-25 MED ORDER — ACETAMINOPHEN 325 MG PO TABS
650.0000 mg | ORAL_TABLET | Freq: Once | ORAL | Status: AC
  Administered 2022-11-25: 650 mg via ORAL
  Filled 2022-11-25: qty 2

## 2022-11-25 MED ORDER — PROCHLORPERAZINE MALEATE 10 MG PO TABS
10.0000 mg | ORAL_TABLET | Freq: Once | ORAL | Status: AC
  Administered 2022-11-25: 10 mg via ORAL
  Filled 2022-11-25: qty 1

## 2022-11-25 MED ORDER — SODIUM CHLORIDE 0.9% FLUSH
10.0000 mL | INTRAVENOUS | Status: DC | PRN
  Administered 2022-11-25: 10 mL

## 2022-11-25 MED ORDER — SODIUM CHLORIDE 0.9 % IV SOLN: Freq: Once | INTRAVENOUS | Status: AC

## 2022-11-25 MED ORDER — CETIRIZINE HCL 10 MG PO TABS
10.0000 mg | ORAL_TABLET | Freq: Once | ORAL | Status: AC
  Administered 2022-11-25: 10 mg via ORAL
  Filled 2022-11-25: qty 1

## 2022-11-25 MED ORDER — SODIUM CHLORIDE 0.9 % IV SOLN
3.6000 mg/kg | Freq: Once | INTRAVENOUS | Status: AC
  Administered 2022-11-25: 260 mg via INTRAVENOUS
  Filled 2022-11-25: qty 8

## 2022-11-25 MED ORDER — HEPARIN SOD (PORK) LOCK FLUSH 100 UNIT/ML IV SOLN
500.0000 [IU] | Freq: Once | INTRAVENOUS | Status: AC | PRN
  Administered 2022-11-25: 500 [IU]

## 2022-11-25 NOTE — Patient Instructions (Signed)
[facility] ONCOLOGY  Discharge Instructions: Thank you for choosing [facility] to provide your oncology and hematology care.   If you have a lab appointment with the [facility], please go directly to the [facility] and check in at the registration area.   Wear comfortable clothing and clothing appropriate for easy access to any Portacath or PICC line.   We strive to give you quality time with your provider. You may need to reschedule your appointment if you arrive late (15 or more minutes).  Arriving late affects you and other patients whose appointments are after yours.  Also, if you miss three or more appointments without notifying the office, you may be dismissed from the clinic at the provider's discretion.      For prescription refill requests, have your pharmacy contact our office and allow 72 hours for refills to be completed.    Today you received the following chemotherapy and/or immunotherapy agents kadcyla      To help prevent nausea and vomiting after your treatment, we encourage you to take your nausea medication as directed.  BELOW ARE SYMPTOMS THAT SHOULD BE REPORTED IMMEDIATELY: *FEVER GREATER THAN 100.4 F (38 C) OR HIGHER *CHILLS OR SWEATING *NAUSEA AND VOMITING THAT IS NOT CONTROLLED WITH YOUR NAUSEA MEDICATION *UNUSUAL SHORTNESS OF BREATH *UNUSUAL BRUISING OR BLEEDING *URINARY PROBLEMS (pain or burning when urinating, or frequent urination) *BOWEL PROBLEMS (unusual diarrhea, constipation, pain near the anus) TENDERNESS IN MOUTH AND THROAT WITH OR WITHOUT PRESENCE OF ULCERS (sore throat, sores in mouth, or a toothache) UNUSUAL RASH, SWELLING OR PAIN  UNUSUAL VAGINAL DISCHARGE OR ITCHING   Items with * indicate a potential emergency and should be followed up as soon as possible or go to the Emergency Department if any problems should occur.  Please show the CHEMOTHERAPY ALERT CARD or IMMUNOTHERAPY ALERT CARD at check-in to the  Emergency Department and triage nurse.  Should you have questions after your visit or need to cancel or reschedule your appointment, please contact [facility]  Dept: [phone]  and follow the prompts.  Office hours are 8:00 a.m. to 4:30 p.m. Monday - Friday. Please note that voicemails left after 4:00 p.m. may not be returned until the following business day.  We are closed weekends and major holidays. You have access to a nurse at all times for urgent questions. Please call the main number to the clinic Dept: [phone] and follow the prompts.   For any non-urgent questions, you may also contact your provider using MyChart. We now offer e-Visits for anyone 18 and older to request care online for non-urgent symptoms. For details visit [email or web address].   Also download the MyChart app! Go to the app store, search "MyChart", open the app, select [facility], and log in with your MyChart username and password.

## 2022-11-25 NOTE — Assessment & Plan Note (Signed)
03/04/2022 History of subpectoral implants, palpable left breast mass by ultrasound 1.4 cm.  Multiple additional masses 1.1 and 0.9 cm.  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  Breast MRI showed at least 7 different masses largest 1.6 cm.  No lymphadenopathy   Treatment plan: 1. Neoadjuvant chemotherapy with TCH  6 cycles completed 07/01/2022 followed byKadcyla maintenance started 09/02/2022 2. left mastectomy with sentinel lymph node study: 08/13/2022 ([facility]): Multifocal grade 2 IDC 8 mm, 5 mm, 6 mm, 0/4 lymph nodes, ER/PR positive HER2 positive, reconstruction 08/21/2022 3. Followed by adjuvant antiestrogen therapy -------------------------------------------------------------------------------------------------------------------------------------------- Current treatment: Kadcyla cycle 5, antiestrogen therapy with tamoxifen (will be started in 3 weeks)   Kadcyla toxicities: Mild intermittent peripheral neuropathy: Encouraged her to take B12 supplement.  We will be monitoring this closely.   Tomorrow she goes to [place] with her friends   Return to clinic in 3 weeks for cycle 6

## 2022-11-25 NOTE — Progress Notes (Signed)
Patient Care Team: Donnajean Lopes, MD as PCP - General (Internal Medicine) Janina Mayo, MD as PCP - Cardiology (Cardiology) Mauro Kaufmann, RN as Oncology Nurse Navigator Rockwell Germany, RN as Oncology Nurse Navigator Nicholas Lose, MD as Consulting Physician (Hematology and Oncology) Rolm Bookbinder, MD as Consulting Physician (General Surgery)  DIAGNOSIS:  Encounter Diagnosis  Name Primary?   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  Multiple additional masses 1.1 and 0.9 cm.  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  Breast MRI showed at least 7 different masses largest 1.6 cm.  No lymphadenopathy   03/04/2022 Cancer Staging   Staging form: Breast, AJCC 8th Edition - Clinical stage from 03/04/2022: Stage IA (cT1c, cN0, cM0, G2, ER+, PR+, HER2+) - Signed by Gardenia Phlegm, NP on 11/13/2022 Stage prefix: Initial diagnosis Histologic grading system: 3 grade system   03/17/2022 Genetic Testing   Negative genetic testing through the CancerNext performed at [facility] Surgery office.  The report date is Mar 17, 2022.  The CancerNext gene panel offered by [employer] includes sequencing and rearrangement analysis for the following 34 genes:   APC, ATM, BARD1, BMPR1A, BRCA1, BRCA2, BRIP1, CDH1, CDK4, CDKN2A, CHEK2, DICER1, HOXB13, EPCAM, GREM1, MLH1, MRE11A, MSH2, MSH6, MUTYH, NBN, NF1, PALB2, PMS2, POLD1, POLE, PTEN, RAD50, RAD51C, RAD51D, SMAD4, SMARCA4, STK11, and TP53.     03/18/2022 - 08/12/2022 Chemotherapy   Patient is on Treatment Plan : BREAST Docetaxel + Carboplatin +  Trastuzumab ([place]) q21d / Trastuzumab q21d     08/13/2022 Surgery   Left mastectomy with sentinel lymph node study: 08/13/2022 ([facility]): Multifocal grade 2 IDC 8 mm, 5 mm, 6 mm, 0/4 lymph nodes, ER/PR positive HER2 positive, reconstruction 08/21/2022    08/13/2022 Cancer Staging   Staging form: Breast, AJCC 8th Edition - Pathologic stage from 08/13/2022: No Stage Recommended (ypT1b, pN0, cM0, G2, ER+, PR+, HER2+) - Signed by Gardenia Phlegm, NP on 11/13/2022 Stage prefix: Post-therapy Histologic grading system: 3 grade system   09/02/2022 -  Chemotherapy   Patient is on Treatment Plan : BREAST ADO-Trastuzumab Emtansine (Kadcyla) q21d     10/2022 -  Anti-estrogen oral therapy   Tamoxifen daily     CHIEF COMPLIANT: Follow-up Kadcyla cycle 5   INTERVAL HISTORY: [name] is a 42 y.o with left breast estrogen receptor positive. Currently on antiestrogen therapy with tamoxifen and treatment with Kadcyla. She presents to the clinic for a follow-up for cycle 5. She reports that her joints is hurting and ankles and wrist. She stopped taking the tamoxifen. She does get nauseas on day 3. She has swelling in the hands in the morning when she wakes up. She denies diarrhea. She says she does still get the neuropathy in the fingers.   ALLERGIES:  is allergic to sulfamethoxazole-trimethoprim and wound dressing adhesive.  MEDICATIONS:  Current Outpatient Medications  Medication Sig Dispense Refill   prochlorperazine (COMPAZINE) 10 MG tablet Take 1 tablet (10 mg total) by mouth every 6 (six) hours as needed for nausea or vomiting. 30 tablet 3   ALPRAZolam (XANAX) 0.25 MG tablet Take 0.25 mg by mouth.  Fezolinetant (VEOZAH) 45 MG TABS Take 1 tablet by mouth daily. 30 tablet 2   hydrOXYzine (ATARAX) 10 MG tablet Take 1 tablet (10 mg total) by mouth 3 (three) times daily as needed. 30 tablet 0   lidocaine-prilocaine (EMLA) cream Apply to affected area once 30 g 3   tamoxifen (NOLVADEX) 20 MG  tablet Take 1 tablet (20 mg total) by mouth daily. (Patient not taking: Reported on 11/13/2022) 90 tablet 3   No current facility-administered medications for this visit.    PHYSICAL EXAMINATION: ECOG PERFORMANCE STATUS: 1 - Symptomatic but completely ambulatory  Vitals:   11/25/22 0842  BP: 128/81  Pulse: 65  Resp: 13  Temp: (!) 97.5 F (36.4 C)  SpO2: 100%   Filed Weights   11/25/22 0842  Weight: 173 lb 3.2 oz (78.6 kg)      LABORATORY DATA:  I have reviewed the data as listed    Latest Ref Rng & Units 11/25/2022    8:30 AM 11/04/2022    8:22 AM 10/13/2022    8:59 AM  CMP  Glucose 70 - 99 mg/dL 123  120  95   BUN 6 - 20 mg/dL '19  22  20   '$ Creatinine 0.44 - 1.00 mg/dL 0.90  0.96  0.83   Sodium 135 - 145 mmol/L 138  137  139   Potassium 3.5 - 5.1 mmol/L 3.3  3.6  3.7   Chloride 98 - 111 mmol/L 103  103  103   CO2 22 - 32 mmol/L '28  28  29   '$ Calcium 8.9 - 10.3 mg/dL 9.7  9.8  10.2   Total Protein 6.5 - 8.1 g/dL 7.2  7.5  7.8   Total Bilirubin 0.3 - 1.2 mg/dL 0.6  0.6  0.5   Alkaline Phos 38 - 126 U/L 56  63  59   AST 15 - 41 U/L 40  29  32   ALT 0 - 44 U/L 32  21  31     Lab Results  Component Value Date   WBC 5.1 11/25/2022   HGB 12.7 11/25/2022   HCT 36.1 11/25/2022   MCV 94.8 11/25/2022   PLT 187 11/25/2022   NEUTROABS 1.9 11/25/2022    ASSESSMENT & PLAN:  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  Multiple additional masses 1.1 and 0.9 cm.  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  Breast MRI showed at least 7 different masses largest 1.6 cm.  No lymphadenopathy   Treatment plan: 1. Neoadjuvant chemotherapy with TCH  6 cycles completed 07/01/2022 followed byKadcyla maintenance started 09/02/2022 2. left mastectomy with sentinel lymph node study: 08/13/2022  ([facility]): Multifocal grade 2 IDC 8 mm, 5 mm, 6 mm, 0/4 lymph nodes, ER/PR positive HER2 positive, reconstruction 08/21/2022 3. Followed by adjuvant antiestrogen therapy -------------------------------------------------------------------------------------------------------------------------------------------- Current treatment: Kadcyla cycle 5, antiestrogen therapy with tamoxifen (will be started in 3 weeks)   Kadcyla toxicities: Mild intermittent peripheral neuropathy: Encouraged her to take B12 supplement.  We will be monitoring this closely.   Tamoxifen toxicities: Severe joint stiffness especially in the shoulders: Tamoxifen was discontinued and she is slowly improving. Swelling of the hands: Started off when she started tamoxifen. We will try to resume tamoxifen after [place] is completed and may be we will start at a lower dosage and titrate upwards.  Cording: Causing severe pain in the left arm.  She will talk with physical therapy and see what can be done about it.   Return to clinic in 3 weeks for cycle 6    No orders of the defined types were placed in this encounter.  The patient has a good understanding of the overall plan. she agrees with it. she will call with any problems that may develop before the next visit here. Total time spent: 30 mins including face to face time and time spent for planning, charting and co-ordination of care   Harriette Ohara, MD 11/25/22    I Gardiner Coins am acting as a Education administrator for [employer]  I have reviewed the above documentation for accuracy and completeness, and I agree with the above.

## 2022-11-27 ENCOUNTER — Telehealth: Payer: Self-pay

## 2022-11-27 NOTE — Telephone Encounter (Signed)
-----  Message from Janina Mayo, MD sent at 11/18/2022  4:33 PM EST ----- Regarding: RE: Happy to see her in clinic to discuss. Eliezer Lofts can you make a follow up appointment for next available ----- Message ----- From: Gardenia Phlegm, NP Sent: 11/17/2022   1:36 PM EST To: Meagan J Baucom; Melissa L Morford, RDCS; # Subject: RE:                                            Hey there,   Sorry for the confusion.  I think she expected to have an appt in December to review the echo.  She wants to make sure in April she can have an appt to review with Dr. Harl Bowie.   ----- Message ----- From: Adrian Prince, RDCS Sent: 11/17/2022   7:08 AM EST To: Will Bonnet; Gardenia Phlegm, NP; # Subject: RE:                                            I think we are confused.  She had an echo on 12/5.  Dr. Nelly Laurence note states she will not need another until April.  She did have GLS performed on 12/5.  Is another required?  She has several orders placed, yet one was completed.  ----- Message ----- From: Elouise Munroe, MD Sent: 11/14/2022   4:56 PM EST To: Will Bonnet; Melissa L Morford, RDCS; # Subject: RE:                                            Lenna Sciara and Meagan can you help? GA ----- Message ----- From: Janina Mayo, MD Sent: 11/14/2022   2:55 PM EST To: Gardenia Phlegm, NP; #  Absolutely.  Hi Zedrick Springsteen, can you help schedule Mrs. Louischarles for a limited echocardiogram with global longitudinal strain? The December study was not scheduled despite being ordered.  Thank you !  ----- Message ----- From: Gardenia Phlegm, NP Sent: 11/13/2022  12:13 PM EST To: Janina Mayo, MD  Hey this patient didn't get her echo scheduled for next time, and she said she needs f/u with you around then too.  Can you send this to whomever?

## 2022-11-27 NOTE — Telephone Encounter (Signed)
Attempted to call patient but unable to reach. Left message (okay per DPR) for patient to call if appointment needed now to go over Echo results or if patient would like to schedule OV with Dr. Harl Bowie for April following repeat Echo. Call back number left.

## 2022-12-01 ENCOUNTER — Ambulatory Visit: Payer: 59 | Attending: Hematology and Oncology

## 2022-12-01 VITALS — Wt 170.1 lb

## 2022-12-01 DIAGNOSIS — Z483 Aftercare following surgery for neoplasm: Secondary | ICD-10-CM | POA: Insufficient documentation
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
## 2022-12-09 ENCOUNTER — Encounter: Payer: Self-pay | Admitting: Hematology and Oncology

## 2022-12-11 ENCOUNTER — Encounter: Payer: Self-pay | Admitting: *Deleted

## 2022-12-16 ENCOUNTER — Inpatient Hospital Stay: Payer: 59

## 2022-12-16 ENCOUNTER — Encounter: Payer: Self-pay | Admitting: Adult Health

## 2022-12-16 ENCOUNTER — Inpatient Hospital Stay: Payer: 59 | Attending: Hematology and Oncology

## 2022-12-16 ENCOUNTER — Inpatient Hospital Stay (HOSPITAL_BASED_OUTPATIENT_CLINIC_OR_DEPARTMENT_OTHER): Payer: 59 | Admitting: Adult Health

## 2022-12-16 ENCOUNTER — Encounter: Payer: Self-pay | Admitting: Hematology and Oncology

## 2022-12-16 VITALS — BP 115/78 | HR 58 | Temp 97.8°F | Resp 18

## 2022-12-16 DIAGNOSIS — Z5112 Encounter for antineoplastic immunotherapy: Secondary | ICD-10-CM | POA: Insufficient documentation

## 2022-12-16 DIAGNOSIS — Z79810 Long term (current) use of selective estrogen receptor modulators (SERMs): Secondary | ICD-10-CM | POA: Insufficient documentation

## 2022-12-16 DIAGNOSIS — G629 Polyneuropathy, unspecified: Secondary | ICD-10-CM | POA: Diagnosis not present

## 2022-12-16 DIAGNOSIS — Z170 Estrogen receptor positive status [ER+]: Secondary | ICD-10-CM | POA: Diagnosis not present

## 2022-12-16 DIAGNOSIS — C50012 Malignant neoplasm of nipple and areola, left female breast: Secondary | ICD-10-CM

## 2022-12-16 DIAGNOSIS — R110 Nausea: Secondary | ICD-10-CM | POA: Diagnosis not present

## 2022-12-16 DIAGNOSIS — R197 Diarrhea, unspecified: Secondary | ICD-10-CM | POA: Diagnosis not present

## 2022-12-16 DIAGNOSIS — Z95828 Presence of other vascular implants and grafts: Secondary | ICD-10-CM

## 2022-12-16 LAB — CMP (CANCER CENTER ONLY)
ALT: 35 U/L (ref 0–44)
AST: 42 U/L — ABNORMAL HIGH (ref 15–41)
Albumin: 3.9 g/dL (ref 3.5–5.0)
Alkaline Phosphatase: 68 U/L (ref 38–126)
Anion gap: 6 (ref 5–15)
BUN: 20 mg/dL (ref 6–20)
CO2: 29 mmol/L (ref 22–32)
Calcium: 9.7 mg/dL (ref 8.9–10.3)
Chloride: 102 mmol/L (ref 98–111)
Creatinine: 0.91 mg/dL (ref 0.44–1.00)
GFR, Estimated: >60 mL/min (ref >60)
Glucose, Bld: 88 mg/dL (ref 70–99)
Potassium: 3.7 mmol/L (ref 3.5–5.1)
Sodium: 137 mmol/L (ref 135–145)
Total Bilirubin: 0.5 mg/dL (ref 0.3–1.2)
Total Protein: 7 g/dL (ref 6.5–8.1)

## 2022-12-16 LAB — CBC WITH DIFFERENTIAL (CANCER CENTER ONLY)
Abs Immature Granulocytes: 0.01 10*3/uL (ref 0.00–0.07)
Basophils Absolute: 0.1 10*3/uL (ref 0.0–0.1)
Basophils Relative: 1 %
Eosinophils Absolute: 0.3 10*3/uL (ref 0.0–0.5)
Eosinophils Relative: 5 %
HCT: 36.5 % (ref 36.0–46.0)
Hemoglobin: 13.2 g/dL (ref 12.0–15.0)
Immature Granulocytes: 0 %
Lymphocytes Relative: 33 %
Lymphs Abs: 1.9 10*3/uL (ref 0.7–4.0)
MCH: 33.7 pg (ref 26.0–34.0)
MCHC: 36.2 g/dL — ABNORMAL HIGH (ref 30.0–36.0)
MCV: 93.1 fL (ref 80.0–100.0)
Monocytes Absolute: 0.5 10*3/uL (ref 0.1–1.0)
Monocytes Relative: 9 %
Neutro Abs: 3 10*3/uL (ref 1.7–7.7)
Neutrophils Relative %: 52 %
Platelet Count: 159 10*3/uL (ref 150–400)
RBC: 3.92 MIL/uL (ref 3.87–5.11)
RDW: 12.6 % (ref 11.5–15.5)
WBC Count: 5.9 10*3/uL (ref 4.0–10.5)
nRBC: 0 % (ref 0.0–0.2)

## 2022-12-16 MED ORDER — PROCHLORPERAZINE MALEATE 10 MG PO TABS
10.0000 mg | ORAL_TABLET | Freq: Once | ORAL | Status: AC
  Administered 2022-12-16: 10 mg via ORAL
  Filled 2022-12-16: qty 1

## 2022-12-16 MED ORDER — SODIUM CHLORIDE 0.9% FLUSH
10.0000 mL | Freq: Once | INTRAVENOUS | Status: AC
  Administered 2022-12-16: 10 mL

## 2022-12-16 MED ORDER — ACETAMINOPHEN 325 MG PO TABS
650.0000 mg | ORAL_TABLET | Freq: Once | ORAL | Status: AC
  Administered 2022-12-16: 650 mg via ORAL
  Filled 2022-12-16: qty 2

## 2022-12-16 MED ORDER — SODIUM CHLORIDE 0.9 % IV SOLN: Freq: Once | INTRAVENOUS | Status: AC

## 2022-12-16 MED ORDER — SODIUM CHLORIDE 0.9% FLUSH
10.0000 mL | INTRAVENOUS | Status: DC | PRN
  Administered 2022-12-16: 10 mL

## 2022-12-16 MED ORDER — SODIUM CHLORIDE 0.9 % IV SOLN
3.6000 mg/kg | Freq: Once | INTRAVENOUS | Status: AC
  Administered 2022-12-16: 260 mg via INTRAVENOUS
  Filled 2022-12-16: qty 8

## 2022-12-16 MED ORDER — CETIRIZINE HCL 10 MG PO TABS
10.0000 mg | ORAL_TABLET | Freq: Once | ORAL | Status: AC
  Administered 2022-12-16: 10 mg via ORAL
  Filled 2022-12-16: qty 1

## 2022-12-16 MED ORDER — HEPARIN SOD (PORK) LOCK FLUSH 100 UNIT/ML IV SOLN
500.0000 [IU] | Freq: Once | INTRAVENOUS | Status: AC | PRN
  Administered 2022-12-16: 500 [IU]

## 2022-12-16 NOTE — Assessment & Plan Note (Addendum)
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  [name] continues on [place].  She is tolerating treatment moderately well and will continue this.  She is ready to be finished with the Kadcyla.  I reviewed with her that [name] clinical trial results which was based on the administration of 14 cycles of Kadcyla therapy which would go through July 2024.  She was discouraged to hear this because she thought she would be finished in April.  She has been holding tamoxifen therapy due to increased side effects when taking with Kadcyla.  She will resume Tamoxifen once the Kadcyla has completed.  She will return in 3 weeks for labs, f/u, and her next treatment.
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
## 2022-12-16 NOTE — Patient Instructions (Signed)
[facility]  Discharge Instructions: Thank you for choosing [facility] to provide your oncology and hematology care.   If you have a lab appointment with the [facility], please go directly to the [facility] and check in at the registration area.   Wear comfortable clothing and clothing appropriate for easy access to any Portacath or PICC line.   We strive to give you quality time with your provider. You may need to reschedule your appointment if you arrive late (15 or more minutes).  Arriving late affects you and other patients whose appointments are after yours.  Also, if you miss three or more appointments without notifying the office, you may be dismissed from the clinic at the provider's discretion.      For prescription refill requests, have your pharmacy contact our office and allow 72 hours for refills to be completed.    Today you received the following chemotherapy and/or immunotherapy agent: Ado-Trastuzumab (Kadcyla)   To help prevent nausea and vomiting after your treatment, we encourage you to take your nausea medication as directed.  BELOW ARE SYMPTOMS THAT SHOULD BE REPORTED IMMEDIATELY: *FEVER GREATER THAN 100.4 F (38 C) OR HIGHER *CHILLS OR SWEATING *NAUSEA AND VOMITING THAT IS NOT CONTROLLED WITH YOUR NAUSEA MEDICATION *UNUSUAL SHORTNESS OF BREATH *UNUSUAL BRUISING OR BLEEDING *URINARY PROBLEMS (pain or burning when urinating, or frequent urination) *BOWEL PROBLEMS (unusual diarrhea, constipation, pain near the anus) TENDERNESS IN MOUTH AND THROAT WITH OR WITHOUT PRESENCE OF ULCERS (sore throat, sores in mouth, or a toothache) UNUSUAL RASH, SWELLING OR PAIN  UNUSUAL VAGINAL DISCHARGE OR ITCHING   Items with * indicate a potential emergency and should be followed up as soon as possible or go to the Emergency Department if any problems should occur.  Please show the CHEMOTHERAPY ALERT CARD or IMMUNOTHERAPY ALERT  CARD at check-in to the Emergency Department and triage nurse.  Should you have questions after your visit or need to cancel or reschedule your appointment, please contact [facility]  Dept: [phone]  and follow the prompts.  Office hours are 8:00 a.m. to 4:30 p.m. Monday - Friday. Please note that voicemails left after 4:00 p.m. may not be returned until the following business day.  We are closed weekends and major holidays. You have access to a nurse at all times for urgent questions. Please call the main number to the clinic Dept: [phone] and follow the prompts.   For any non-urgent questions, you may also contact your provider using MyChart. We now offer e-Visits for anyone 35 and older to request care online for non-urgent symptoms. For details visit [email or web address].   Also download the MyChart app! Go to the app store, search "MyChart", open the app, select [facility], and log in with your MyChart username and password.  Ado-Trastuzumab Emtansine Injection What is this medication? ADO-TRASTUZUMAB EMTANSINE (ADD oh traz TOO zuh mab em TAN zine) treats breast cancer. It works by blocking a protein that causes cancer cells to grow and multiply. This helps to slow or stop the spread of cancer cells. This medicine may be used for other purposes; ask your health care provider or pharmacist if you have questions. COMMON BRAND NAME(S): Kadcyla What should I tell my care team before I take this medication? They need to know if you have any of these conditions: Heart failure Liver disease Low platelet levels Lung disease Tingling of the fingers or toes or  other nerve disorder An unusual or allergic reaction to ado-trastuzumab emtansine, other medications, foods, dyes, or preservatives Pregnant or trying to get pregnant Breast-feeding How should I use this medication? This medication is infused into a vein. It is given by your care team in a  hospital or clinic setting. Talk to your care team about the use of this medication in children. Special care may be needed. Overdosage: If you think you have taken too much of this medicine contact a poison control center or emergency room at once. NOTE: This medicine is only for you. Do not share this medicine with others. What if I miss a dose? Keep appointments for follow-up doses. It is important not to miss your dose. Call your care team if you are unable to keep an appointment. What may interact with this medication? Atazanavir Boceprevir Clarithromycin Dalfopristin; quinupristin Delavirdine Indinavir Isoniazid, INH Itraconazole Ketoconazole Nefazodone Nelfinavir Ritonavir Telaprevir Telithromycin Tipranavir Voriconazole This list may not describe all possible interactions. Give your health care provider a list of all the medicines, herbs, non-prescription drugs, or dietary supplements you use. Also tell them if you smoke, drink alcohol, or use illegal drugs. Some items may interact with your medicine. What should I watch for while using this medication? This medication may make you feel generally unwell. This is not uncommon, as chemotherapy can affect healthy cells as well as cancer cells. Report any side effects. Continue your course of treatment even though you feel ill unless your care team tells you to stop. You may need blood work while taking this medication. This medication may increase your risk to bruise or bleed. Call your care team if you notice any unusual bleeding. Be careful brushing or flossing your teeth or using a toothpick because you may get an infection or bleed more easily. If you have any dental work done, tell your dentist you are receiving this medication. Talk to your care team if you may be pregnant. Serious birth defects can occur if you take this medication during pregnancy and for 7 months after the last dose. You will need a negative pregnancy test  before starting this medication. Contraception is recommended while taking this medication and for 7 months after the last dose. Your care team can help you find the option that works for you. If your partner can get pregnant, use a condom during sex while taking this medication and for 4 months after the last dose. Do not breastfeed while taking this medication and for 7 months after the last dose. This medication may cause infertility. Talk to your care team if you are concerned with your fertility. What side effects may I notice from receiving this medication? Side effects that you should report to your care team as soon as possible: Allergic reactions--skin rash, itching, hives, swelling of the face, lips, tongue, or throat Bleeding--bloody or black, tar-like stools, vomiting blood or brown material that looks like coffee grounds, red or dark brown urine, small red or purple spots on skin, unusual bruising or bleeding Dry cough, shortness of breath or trouble breathing Heart failure--shortness of breath, swelling of the ankles, feet, or hands, sudden weight gain, unusual weakness or fatigue Infusion reactions--chest pain, shortness of breath or trouble breathing, feeling faint or lightheaded Liver injury--right upper belly pain, loss of appetite, nausea, light-colored stool, dark yellow or brown urine, yellowing skin or eyes, unusual weakness or fatigue Pain, tingling, or numbness in the hands or feet Painful swelling, warmth, or redness of the skin, blisters or  sores at the infusion site Side effects that usually do not require medical attention (report to your care team if they continue or are bothersome): Constipation Fatigue Headache Muscle pain Nausea This list may not describe all possible side effects. Call your doctor for medical advice about side effects. You may report side effects to FDA at 1-800-FDA-1088. Where should I keep my medication? This medication is given in a hospital  or clinic. It will not be stored at home. NOTE: This sheet is a summary. It may not cover all possible information. If you have questions about this medicine, talk to your doctor, pharmacist, or health care provider.  2023 Elsevier/Gold Standard (2022-03-14 00:00:00)

## 2022-12-16 NOTE — Progress Notes (Signed)
Pt. declines to stay for 30 minute post observation. Vital signs stable, left via ambulation and no respiratory distress noted.

## 2022-12-31 NOTE — Progress Notes (Signed)
Patient Care Team: Donnajean Lopes, MD as PCP - General (Internal Medicine) Janina Mayo, MD as PCP - Cardiology (Cardiology) Mauro Kaufmann, RN as Oncology Nurse Navigator Rockwell Germany, RN as Oncology Nurse Navigator Nicholas Lose, MD as Consulting Physician (Hematology and Oncology) Rolm Bookbinder, MD as Consulting Physician (General Surgery)  DIAGNOSIS: No diagnosis found.  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  Multiple additional masses 1.1 and 0.9 cm.  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  Breast MRI showed at least 7 different masses largest 1.6 cm.  No lymphadenopathy   03/04/2022 Cancer Staging   Staging form: Breast, AJCC 8th Edition - Clinical stage from 03/04/2022: Stage IA (cT1c, cN0, cM0, G2, ER+, PR+, HER2+) - Signed by Gardenia Phlegm, NP on 11/13/2022 Stage prefix: Initial diagnosis Histologic grading system: 3 grade system   03/17/2022 Genetic Testing   Negative genetic testing through the CancerNext performed at [facility] office.  The report date is Mar 17, 2022.  The CancerNext gene panel offered by [employer] includes sequencing and rearrangement analysis for the following 34 genes:   APC, ATM, BARD1, BMPR1A, BRCA1, BRCA2, BRIP1, CDH1, CDK4, CDKN2A, CHEK2, DICER1, HOXB13, EPCAM, GREM1, MLH1, MRE11A, MSH2, MSH6, MUTYH, NBN, NF1, PALB2, PMS2, POLD1, POLE, PTEN, RAD50, RAD51C, RAD51D, SMAD4, SMARCA4, STK11, and TP53.     03/18/2022 - 08/12/2022 Chemotherapy   Patient is on Treatment Plan : BREAST Docetaxel + Carboplatin + Trastuzumab ([facility]) q21d / Trastuzumab q21d     08/13/2022 Surgery   Left mastectomy with sentinel lymph node study: 08/13/2022 ([facility]):  Multifocal grade 2 IDC 8 mm, 5 mm, 6 mm, 0/4 lymph nodes, ER/PR positive HER2 positive, reconstruction 08/21/2022    08/13/2022 Cancer Staging   Staging form: Breast, AJCC 8th Edition - Pathologic stage from 08/13/2022: No Stage Recommended (ypT1b, pN0, cM0, G2, ER+, PR+, HER2+) - Signed by Gardenia Phlegm, NP on 11/13/2022 Stage prefix: Post-therapy Histologic grading system: 3 grade system   09/02/2022 -  Chemotherapy   Patient is on Treatment Plan : BREAST ADO-Trastuzumab Emtansine (Kadcyla) q21d     10/2022 -  Anti-estrogen oral therapy   Tamoxifen daily     CHIEF COMPLIANT: Follow-up Kadcyla cycle 6  INTERVAL HISTORY: [name] is a 42 y.o with left breast estrogen receptor positive. Currently on antiestrogen therapy with tamoxifen and treatment with Kadcyla. She presents to the clinic for a follow-up for cycle 6.   ALLERGIES:  is allergic to sulfamethoxazole-trimethoprim and wound dressing adhesive.  MEDICATIONS:  Current Outpatient Medications  Medication Sig Dispense Refill   ALPRAZolam (XANAX) 0.25 MG tablet Take 0.25 mg by mouth.     Fezolinetant (VEOZAH) 45 MG TABS Take 1 tablet by mouth daily. 30 tablet 2   hydrOXYzine (ATARAX) 10 MG tablet Take 1 tablet (10 mg total) by mouth 3 (three) times daily as needed. 30 tablet 0   lidocaine-prilocaine (EMLA) cream Apply to affected area once 30 g 3   prochlorperazine (COMPAZINE) 10 MG tablet Take 1 tablet (10 mg total) by mouth every 6 (six) hours as needed for nausea or vomiting. 30 tablet 3   tamoxifen (NOLVADEX) 20 MG tablet Take 1 tablet (20 mg total) by mouth daily. (Patient not taking: Reported on  11/13/2022) 90 tablet 3   No current facility-administered medications for this visit.    PHYSICAL EXAMINATION: ECOG PERFORMANCE STATUS: {CHL ONC ECOG PS:[phone]}  There were no vitals filed for this visit. There were no vitals filed for this visit.  BREAST:*** No palpable masses or nodules in either right or  left breasts. No palpable axillary supraclavicular or infraclavicular adenopathy no breast tenderness or nipple discharge. (exam performed in the presence of a chaperone)  LABORATORY DATA:  I have reviewed the data as listed    Latest Ref Rng & Units 12/16/2022    9:20 AM 11/25/2022    8:30 AM 11/04/2022    8:22 AM  CMP  Glucose 70 - 99 mg/dL 88  123  120   BUN 6 - 20 mg/dL '20  19  22   '$ Creatinine 0.44 - 1.00 mg/dL 0.91  0.90  0.96   Sodium 135 - 145 mmol/L 137  138  137   Potassium 3.5 - 5.1 mmol/L 3.7  3.3  3.6   Chloride 98 - 111 mmol/L 102  103  103   CO2 22 - 32 mmol/L '29  28  28   '$ Calcium 8.9 - 10.3 mg/dL 9.7  9.7  9.8   Total Protein 6.5 - 8.1 g/dL 7.0  7.2  7.5   Total Bilirubin 0.3 - 1.2 mg/dL 0.5  0.6  0.6   Alkaline Phos 38 - 126 U/L 68  56  63   AST 15 - 41 U/L 42  40  29   ALT 0 - 44 U/L 35  32  21     Lab Results  Component Value Date   WBC 5.9 12/16/2022   HGB 13.2 12/16/2022   HCT 36.5 12/16/2022   MCV 93.1 12/16/2022   PLT 159 12/16/2022   NEUTROABS 3.0 12/16/2022    ASSESSMENT & PLAN:  No problem-specific Assessment & Plan notes found for this encounter.    No orders of the defined types were placed in this encounter.  The patient has a good understanding of the overall plan. she agrees with it. she will call with any problems that may develop before the next visit here. Total time spent: 30 mins including face to face time and time spent for planning, charting and co-ordination of care   Suzzette Righter, [place] 12/31/22    I Gardiner Coins am acting as a Education administrator for [employer]  ***

## 2023-01-02 ENCOUNTER — Other Ambulatory Visit (HOSPITAL_BASED_OUTPATIENT_CLINIC_OR_DEPARTMENT_OTHER): Payer: Self-pay | Admitting: Obstetrics & Gynecology

## 2023-01-02 DIAGNOSIS — R232 Flushing: Secondary | ICD-10-CM

## 2023-01-05 ENCOUNTER — Inpatient Hospital Stay: Payer: 59

## 2023-01-05 ENCOUNTER — Inpatient Hospital Stay (HOSPITAL_BASED_OUTPATIENT_CLINIC_OR_DEPARTMENT_OTHER): Payer: 59 | Admitting: Hematology and Oncology

## 2023-01-05 VITALS — BP 123/74 | HR 57 | Temp 98.1°F | Resp 16 | Wt 178.4 lb

## 2023-01-05 DIAGNOSIS — C50012 Malignant neoplasm of nipple and areola, left female breast: Secondary | ICD-10-CM

## 2023-01-05 DIAGNOSIS — Z95828 Presence of other vascular implants and grafts: Secondary | ICD-10-CM

## 2023-01-05 DIAGNOSIS — Z5112 Encounter for antineoplastic immunotherapy: Secondary | ICD-10-CM | POA: Diagnosis not present

## 2023-01-05 DIAGNOSIS — Z170 Estrogen receptor positive status [ER+]: Secondary | ICD-10-CM | POA: Diagnosis not present

## 2023-01-05 LAB — CBC WITH DIFFERENTIAL (CANCER CENTER ONLY)
Abs Immature Granulocytes: 0 10*3/uL (ref 0.00–0.07)
Basophils Absolute: 0.1 10*3/uL (ref 0.0–0.1)
Basophils Relative: 1 %
Eosinophils Absolute: 0.2 10*3/uL (ref 0.0–0.5)
Eosinophils Relative: 5 %
HCT: 37.1 % (ref 36.0–46.0)
Hemoglobin: 12.8 g/dL (ref 12.0–15.0)
Immature Granulocytes: 0 %
Lymphocytes Relative: 46 %
Lymphs Abs: 2.3 10*3/uL (ref 0.7–4.0)
MCH: 32.8 pg (ref 26.0–34.0)
MCHC: 34.5 g/dL (ref 30.0–36.0)
MCV: 95.1 fL (ref 80.0–100.0)
Monocytes Absolute: 0.4 10*3/uL (ref 0.1–1.0)
Monocytes Relative: 8 %
Neutro Abs: 2 10*3/uL (ref 1.7–7.7)
Neutrophils Relative %: 40 %
Platelet Count: 148 10*3/uL — ABNORMAL LOW (ref 150–400)
RBC: 3.9 MIL/uL (ref 3.87–5.11)
RDW: 13.6 % (ref 11.5–15.5)
WBC Count: 5 10*3/uL (ref 4.0–10.5)
nRBC: 0 % (ref 0.0–0.2)

## 2023-01-05 LAB — CMP (CANCER CENTER ONLY)
ALT: 27 U/L (ref 0–44)
AST: 40 U/L (ref 15–41)
Albumin: 3.8 g/dL (ref 3.5–5.0)
Alkaline Phosphatase: 67 U/L (ref 38–126)
Anion gap: 6 (ref 5–15)
BUN: 19 mg/dL (ref 6–20)
CO2: 28 mmol/L (ref 22–32)
Calcium: 8.6 mg/dL — ABNORMAL LOW (ref 8.9–10.3)
Chloride: 103 mmol/L (ref 98–111)
Creatinine: 0.86 mg/dL (ref 0.44–1.00)
GFR, Estimated: >60 mL/min (ref >60)
Glucose, Bld: 120 mg/dL — ABNORMAL HIGH (ref 70–99)
Potassium: 3.6 mmol/L (ref 3.5–5.1)
Sodium: 137 mmol/L (ref 135–145)
Total Bilirubin: 0.6 mg/dL (ref 0.3–1.2)
Total Protein: 7.1 g/dL (ref 6.5–8.1)

## 2023-01-05 MED ORDER — ACETAMINOPHEN 325 MG PO TABS
650.0000 mg | ORAL_TABLET | Freq: Once | ORAL | Status: AC
  Administered 2023-01-05: 650 mg via ORAL
  Filled 2023-01-05: qty 2

## 2023-01-05 MED ORDER — SODIUM CHLORIDE 0.9% FLUSH
10.0000 mL | INTRAVENOUS | Status: DC | PRN
  Administered 2023-01-05: 10 mL

## 2023-01-05 MED ORDER — SODIUM CHLORIDE 0.9 % IV SOLN
200.0000 mg | Freq: Once | INTRAVENOUS | Status: AC
  Administered 2023-01-05: 200 mg via INTRAVENOUS
  Filled 2023-01-05: qty 10

## 2023-01-05 MED ORDER — HEPARIN SOD (PORK) LOCK FLUSH 100 UNIT/ML IV SOLN
500.0000 [IU] | Freq: Once | INTRAVENOUS | Status: AC | PRN
  Administered 2023-01-05: 500 [IU]

## 2023-01-05 MED ORDER — CETIRIZINE HCL 10 MG PO TABS
10.0000 mg | ORAL_TABLET | Freq: Once | ORAL | Status: AC
  Administered 2023-01-05: 10 mg via ORAL
  Filled 2023-01-05: qty 1

## 2023-01-05 MED ORDER — SODIUM CHLORIDE 0.9 % IV SOLN: Freq: Once | INTRAVENOUS | Status: AC

## 2023-01-05 MED ORDER — SODIUM CHLORIDE 0.9% FLUSH
10.0000 mL | Freq: Once | INTRAVENOUS | Status: AC
  Administered 2023-01-05: 10 mL

## 2023-01-05 MED ORDER — PROCHLORPERAZINE MALEATE 10 MG PO TABS
10.0000 mg | ORAL_TABLET | Freq: Once | ORAL | Status: AC
  Administered 2023-01-05: 10 mg via ORAL
  Filled 2023-01-05: qty 1

## 2023-01-05 NOTE — Progress Notes (Signed)
Per MD Lindi Adie will give 200 mg dose of Kadcyla

## 2023-01-05 NOTE — Patient Instructions (Signed)
[facility]  Discharge Instructions: Thank you for choosing [facility] to provide your oncology and hematology care.   If you have a lab appointment with the [facility], please go directly to the [facility] and check in at the registration area.   Wear comfortable clothing and clothing appropriate for easy access to any Portacath or PICC line.   We strive to give you quality time with your provider. You may need to reschedule your appointment if you arrive late (15 or more minutes).  Arriving late affects you and other patients whose appointments are after yours.  Also, if you miss three or more appointments without notifying the office, you may be dismissed from the clinic at the provider's discretion.      For prescription refill requests, have your pharmacy contact our office and allow 72 hours for refills to be completed.    Today you received the following chemotherapy and/or immunotherapy agents: ado-trastuzumab-emtansine      To help prevent nausea and vomiting after your treatment, we encourage you to take your nausea medication as directed.  BELOW ARE SYMPTOMS THAT SHOULD BE REPORTED IMMEDIATELY: *FEVER GREATER THAN 100.4 F (38 C) OR HIGHER *CHILLS OR SWEATING *NAUSEA AND VOMITING THAT IS NOT CONTROLLED WITH YOUR NAUSEA MEDICATION *UNUSUAL SHORTNESS OF BREATH *UNUSUAL BRUISING OR BLEEDING *URINARY PROBLEMS (pain or burning when urinating, or frequent urination) *BOWEL PROBLEMS (unusual diarrhea, constipation, pain near the anus) TENDERNESS IN MOUTH AND THROAT WITH OR WITHOUT PRESENCE OF ULCERS (sore throat, sores in mouth, or a toothache) UNUSUAL RASH, SWELLING OR PAIN  UNUSUAL VAGINAL DISCHARGE OR ITCHING   Items with * indicate a potential emergency and should be followed up as soon as possible or go to the Emergency Department if any problems should occur.  Please show the CHEMOTHERAPY ALERT CARD or IMMUNOTHERAPY  ALERT CARD at check-in to the Emergency Department and triage nurse.  Should you have questions after your visit or need to cancel or reschedule your appointment, please contact [facility]  Dept: [phone]  and follow the prompts.  Office hours are 8:00 a.m. to 4:30 p.m. Monday - Friday. Please note that voicemails left after 4:00 p.m. may not be returned until the following business day.  We are closed weekends and major holidays. You have access to a nurse at all times for urgent questions. Please call the main number to the clinic Dept: [phone] and follow the prompts.   For any non-urgent questions, you may also contact your provider using MyChart. We now offer e-Visits for anyone 32 and older to request care online for non-urgent symptoms. For details visit [email or web address].   Also download the MyChart app! Go to the app store, search "MyChart", open the app, select [facility], and log in with your MyChart username and password.

## 2023-01-05 NOTE — Assessment & Plan Note (Signed)
03/04/2022 History of subpectoral implants, palpable left breast mass by ultrasound 1.4 cm.  Multiple additional masses 1.1 and 0.9 cm.  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  Breast MRI showed at least 7 different masses largest 1.6 cm.  No lymphadenopathy   Treatment plan: 1. Neoadjuvant chemotherapy with TCH  6 cycles completed 07/01/2022 followed byKadcyla maintenance started 09/02/2022 2. left mastectomy with sentinel lymph node study: 08/13/2022 ([facility]): Multifocal grade 2 IDC 8 mm, 5 mm, 6 mm, 0/4 lymph nodes, ER/PR positive HER2 positive, reconstruction 08/21/2022 3. Followed by adjuvant antiestrogen therapy -------------------------------------------------------------------------------------------------------------------------------------------- Current treatment: Kadcyla cycle 7, antiestrogen therapy with tamoxifen (will be started in 3 weeks)   Kadcyla toxicities: Mild intermittent peripheral neuropathy: Encouraged her to take B12 supplement.  We will be monitoring this closely.   Tamoxifen toxicities: Severe joint stiffness especially in the shoulders: Tamoxifen was discontinued and she is slowly improving. Swelling of the hands: Started off when she started tamoxifen. We will try to resume tamoxifen after [place] [place] is completed and may be we will start at a lower dosage and titrate upwards.   Cording: Causing severe pain in the left arm.  She will talk with physical therapy and see what can be done about it.   Return to clinic in 3 weeks for cycle 8

## 2023-01-06 ENCOUNTER — Inpatient Hospital Stay: Payer: 59

## 2023-01-06 ENCOUNTER — Inpatient Hospital Stay: Payer: 59 | Admitting: Hematology and Oncology

## 2023-01-20 NOTE — Progress Notes (Signed)
Patient Care Team: Emily Salon, MD as PCP - General (Obstetrics and Gynecology) Emily Mayo, MD as PCP - Cardiology (Cardiology) Emily Kaufmann, RN as Oncology Nurse Navigator Emily Germany, RN as Oncology Nurse Navigator Emily Lose, MD as Consulting Physician (Hematology and Oncology) Emily Bookbinder, MD as Consulting Physician (General Surgery)  DIAGNOSIS:  Encounter Diagnosis  Name Primary?   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  Multiple additional masses 1.1 and 0.9 cm.  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  Breast MRI showed at least 7 different masses largest 1.6 cm.  No lymphadenopathy   03/04/2022 Cancer Staging   Staging form: Breast, AJCC 8th Edition - Clinical stage from 03/04/2022: Stage IA (cT1c, cN0, cM0, G2, ER+, PR+, HER2+) - Signed by Emily Phlegm, NP on 11/13/2022 Stage prefix: Initial diagnosis Histologic grading system: 3 grade system   03/17/2022 Genetic Testing   Negative genetic testing through the CancerNext performed at [facility] Surgery office.  The report date is Mar 17, 2022.  The CancerNext gene panel offered by [employer] includes sequencing and rearrangement analysis for the following 34 genes:   APC, ATM, BARD1, BMPR1A, BRCA1, BRCA2, BRIP1, CDH1, CDK4, CDKN2A, CHEK2, DICER1, HOXB13, EPCAM, GREM1, MLH1, MRE11A, MSH2, MSH6, MUTYH, NBN, NF1, PALB2, PMS2, POLD1, POLE, PTEN, RAD50, RAD51C, RAD51D, SMAD4, SMARCA4, STK11, and TP53.     03/18/2022 - 08/12/2022 Chemotherapy   Patient is on Treatment Plan : BREAST Docetaxel + Carboplatin +  Trastuzumab ([name]) q21d / Trastuzumab q21d     08/13/2022 Surgery   Left mastectomy with sentinel lymph node study: 08/13/2022 ([name]): Multifocal grade 2 IDC 8 mm, 5 mm, 6 mm, 0/4 lymph nodes, ER/PR positive HER2 positive, reconstruction 08/21/2022    08/13/2022 Cancer Staging   Staging form: Breast, AJCC 8th Edition - Pathologic stage from 08/13/2022: No Stage Recommended (ypT1b, pN0, cM0, G2, ER+, PR+, HER2+) - Signed by Emily Phlegm, NP on 11/13/2022 Stage prefix: Post-therapy Histologic grading system: 3 grade system   09/02/2022 -  Chemotherapy   Patient is on Treatment Plan : BREAST ADO-Trastuzumab Emtansine (Kadcyla) q21d     10/2022 -  Anti-estrogen oral therapy   Tamoxifen daily     CHIEF COMPLIANT:  Follow-up Kadcyla cycle 8  INTERVAL HISTORY: [name] is a 42 y.o with left breast estrogen receptor positive. Currently on antiestrogen therapy with tamoxifen and treatment with Kadcyla. She presents to the clinic for a follow-up for cycle 8.    ALLERGIES:  is allergic to sulfamethoxazole-trimethoprim and wound dressing adhesive.  MEDICATIONS:  Current Outpatient Medications  Medication Sig Dispense Refill   ALPRAZolam (XANAX) 0.25 MG tablet Take 0.25 mg by mouth.     hydrOXYzine (ATARAX) 10 MG tablet Take 1 tablet (10 mg total) by mouth 3 (three) times daily as needed. 30 tablet 0   lidocaine-prilocaine (EMLA) cream Apply to affected area once 30 g 3   prochlorperazine (COMPAZINE) 10 MG tablet Take 1 tablet (10 mg total) by mouth every 6 (six) hours as needed for nausea or vomiting. 30 tablet 3   tamoxifen (NOLVADEX) 20 MG tablet Take 1 tablet (  20 mg total) by mouth daily. (Patient not taking: Reported on 11/13/2022) 90 tablet 3   tranexamic acid (LYSTEDA) 650 MG TABS tablet Take 2 tablets (1,300 mg total) by mouth 3 (three) times daily. 30 tablet 1   VEOZAH 45 MG TABS TAKE 1 TABLET BY MOUTH EVERY DAY 30 tablet 2   No current facility-administered medications  for this visit.    PHYSICAL EXAMINATION: ECOG PERFORMANCE STATUS: 1 - Symptomatic but completely ambulatory  Vitals:   01/27/23 0833  BP: 120/74  Pulse: 61  Resp: 18  Temp: 97.8 F (36.6 C)  SpO2: 99%   Filed Weights   01/27/23 0833  Weight: 171 lb 12.8 oz (77.9 kg)      LABORATORY DATA:  I have reviewed the data as listed    Latest Ref Rng & Units 01/22/2023    1:24 PM 01/05/2023    7:50 AM 12/16/2022    9:20 AM  CMP  Glucose 70 - 99 mg/dL 105  120  88   BUN 6 - 20 mg/dL 16  19  20    Creatinine 0.44 - 1.00 mg/dL 0.95  0.86  0.91   Sodium 135 - 145 mmol/L 139  137  137   Potassium 3.5 - 5.1 mmol/L 3.7  3.6  3.7   Chloride 98 - 111 mmol/L 106  103  102   CO2 22 - 32 mmol/L 26  28  29    Calcium 8.9 - 10.3 mg/dL 9.8  8.6  9.7   Total Protein 6.5 - 8.1 g/dL 7.9  7.1  7.0   Total Bilirubin 0.3 - 1.2 mg/dL 1.0  0.6  0.5   Alkaline Phos 38 - 126 U/L 68  67  68   AST 15 - 41 U/L 54  40  42   ALT 0 - 44 U/L 34  27  35     Lab Results  Component Value Date   WBC 5.0 01/27/2023   HGB 13.0 01/27/2023   HCT 36.6 01/27/2023   MCV 93.8 01/27/2023   PLT 124 (L) 01/27/2023   NEUTROABS 1.9 01/27/2023    ASSESSMENT & PLAN:  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  Multiple additional masses 1.1 and 0.9 cm.  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  Breast MRI showed at least 7 different masses largest 1.6 cm.  No lymphadenopathy   Treatment plan: 1. Neoadjuvant chemotherapy with TCH  6 cycles completed 07/01/2022 followed byKadcyla maintenance started 09/02/2022 2. left mastectomy with sentinel lymph node study: 08/13/2022 ([name]): Multifocal grade 2 IDC 8 mm, 5 mm, 6 mm, 0/4 lymph nodes, ER/PR positive HER2 positive, reconstruction 08/21/2022 3. Followed by adjuvant antiestrogen  therapy -------------------------------------------------------------------------------------------------------------------------------------------- Current treatment: Kadcyla cycle 8   Kadcyla toxicities: Mild intermittent peripheral neuropathy monitoring Fatigue and nausea after each treatment: I reduced the dose of Kadcyla with cycle 7   Tamoxifen toxicities: (On hold) Severe joint stiffness especially in the shoulders: Tamoxifen was discontinued and she is slowly improving. Swelling of the hands: Started off when she started tamoxifen. We will try to resume tamoxifen after Steward Drone is completed and may be we will start at a lower dosage and titrate upwards.   Resumption of menstrual cycles: Patient had very heavy bleeding and she has seen her gynecologist who is planning to do an endometrial biopsy.  She is very scared about it but I reassured her that it was the right thing to do.  FSH and estradiol levels were premenopausal levels.  So it is expected that she will resume her cycles.   Return to clinic in 3 weeks for cycle 9    No orders of the defined types were placed in this encounter.  The patient has a good understanding of the overall plan. she agrees with it. she will call with any problems that may develop before the next visit here. Total time spent: 30 mins including face to face time and time spent for planning, charting and co-ordination of care   Harriette Ohara, MD 01/27/23    I Gardiner Coins am acting as a Education administrator for [employer]  I have reviewed the above documentation for accuracy and completeness, and I agree with the above.

## 2023-01-22 ENCOUNTER — Inpatient Hospital Stay: Payer: 59 | Attending: Hematology and Oncology | Admitting: Physician Assistant

## 2023-01-22 ENCOUNTER — Telehealth: Payer: Self-pay

## 2023-01-22 ENCOUNTER — Other Ambulatory Visit (HOSPITAL_BASED_OUTPATIENT_CLINIC_OR_DEPARTMENT_OTHER): Payer: Self-pay | Admitting: Obstetrics & Gynecology

## 2023-01-22 ENCOUNTER — Inpatient Hospital Stay: Payer: 59

## 2023-01-22 ENCOUNTER — Other Ambulatory Visit: Payer: Self-pay

## 2023-01-22 ENCOUNTER — Encounter (HOSPITAL_BASED_OUTPATIENT_CLINIC_OR_DEPARTMENT_OTHER): Payer: Self-pay | Admitting: Obstetrics & Gynecology

## 2023-01-22 ENCOUNTER — Encounter: Payer: Self-pay | Admitting: Hematology and Oncology

## 2023-01-22 VITALS — BP 127/86 | HR 62 | Temp 98.3°F | Resp 18 | Wt 172.7 lb

## 2023-01-22 DIAGNOSIS — C50012 Malignant neoplasm of nipple and areola, left female breast: Secondary | ICD-10-CM | POA: Diagnosis not present

## 2023-01-22 DIAGNOSIS — D696 Thrombocytopenia, unspecified: Secondary | ICD-10-CM | POA: Diagnosis not present

## 2023-01-22 DIAGNOSIS — N939 Abnormal uterine and vaginal bleeding, unspecified: Secondary | ICD-10-CM

## 2023-01-22 DIAGNOSIS — N926 Irregular menstruation, unspecified: Secondary | ICD-10-CM

## 2023-01-22 DIAGNOSIS — Z170 Estrogen receptor positive status [ER+]: Secondary | ICD-10-CM | POA: Diagnosis not present

## 2023-01-22 DIAGNOSIS — Z9012 Acquired absence of left breast and nipple: Secondary | ICD-10-CM | POA: Insufficient documentation

## 2023-01-22 DIAGNOSIS — Z79810 Long term (current) use of selective estrogen receptor modulators (SERMs): Secondary | ICD-10-CM | POA: Diagnosis not present

## 2023-01-22 DIAGNOSIS — Z5111 Encounter for antineoplastic chemotherapy: Secondary | ICD-10-CM | POA: Insufficient documentation

## 2023-01-22 LAB — CMP (CANCER CENTER ONLY)
ALT: 34 U/L (ref 0–44)
AST: 54 U/L — ABNORMAL HIGH (ref 15–41)
Albumin: 4.3 g/dL (ref 3.5–5.0)
Alkaline Phosphatase: 68 U/L (ref 38–126)
Anion gap: 7 (ref 5–15)
BUN: 16 mg/dL (ref 6–20)
CO2: 26 mmol/L (ref 22–32)
Calcium: 9.8 mg/dL (ref 8.9–10.3)
Chloride: 106 mmol/L (ref 98–111)
Creatinine: 0.95 mg/dL (ref 0.44–1.00)
GFR, Estimated: >60 mL/min (ref >60)
Glucose, Bld: 105 mg/dL — ABNORMAL HIGH (ref 70–99)
Potassium: 3.7 mmol/L (ref 3.5–5.1)
Sodium: 139 mmol/L (ref 135–145)
Total Bilirubin: 1 mg/dL (ref 0.3–1.2)
Total Protein: 7.9 g/dL (ref 6.5–8.1)

## 2023-01-22 LAB — CBC WITH DIFFERENTIAL (CANCER CENTER ONLY)
Abs Immature Granulocytes: 0.01 10*3/uL (ref 0.00–0.07)
Basophils Absolute: 0.1 10*3/uL (ref 0.0–0.1)
Basophils Relative: 1 %
Eosinophils Absolute: 0.1 10*3/uL (ref 0.0–0.5)
Eosinophils Relative: 1 %
HCT: 39.2 % (ref 36.0–46.0)
Hemoglobin: 13.7 g/dL (ref 12.0–15.0)
Immature Granulocytes: 0 %
Lymphocytes Relative: 39 %
Lymphs Abs: 2.2 10*3/uL (ref 0.7–4.0)
MCH: 33.2 pg (ref 26.0–34.0)
MCHC: 34.9 g/dL (ref 30.0–36.0)
MCV: 94.9 fL (ref 80.0–100.0)
Monocytes Absolute: 0.5 10*3/uL (ref 0.1–1.0)
Monocytes Relative: 8 %
Neutro Abs: 2.9 10*3/uL (ref 1.7–7.7)
Neutrophils Relative %: 51 %
Platelet Count: 132 10*3/uL — ABNORMAL LOW (ref 150–400)
RBC: 4.13 MIL/uL (ref 3.87–5.11)
RDW: 13.6 % (ref 11.5–15.5)
WBC Count: 5.7 10*3/uL (ref 4.0–10.5)
nRBC: 0 % (ref 0.0–0.2)

## 2023-01-22 LAB — SAMPLE TO BLOOD BANK

## 2023-01-22 MED ORDER — TRANEXAMIC ACID 650 MG PO TABS: 1300.0000 mg | ORAL_TABLET | Freq: Three times a day (TID) | ORAL | 1 refills | Status: DC

## 2023-01-22 NOTE — Telephone Encounter (Signed)
Spoke with pt regarding sx. She states she is soiling tampons wintin an hour and it it leaking to her clothing. Denies using sanitary napkin. Pt was offered appt with [facility] for 1330 today for further assessment and labs. She accepted.
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
## 2023-01-22 NOTE — Progress Notes (Signed)
Orders entered for [facility] visit. 

## 2023-01-23 ENCOUNTER — Other Ambulatory Visit (HOSPITAL_BASED_OUTPATIENT_CLINIC_OR_DEPARTMENT_OTHER): Payer: 59

## 2023-01-23 DIAGNOSIS — N939 Abnormal uterine and vaginal bleeding, unspecified: Secondary | ICD-10-CM

## 2023-01-24 LAB — FOLLICLE STIMULATING HORMONE: FSH: 10.6 m[IU]/mL

## 2023-01-24 LAB — ESTRADIOL: Estradiol: 35.5 pg/mL

## 2023-01-26 ENCOUNTER — Encounter: Payer: Self-pay | Admitting: *Deleted

## 2023-01-27 ENCOUNTER — Inpatient Hospital Stay (HOSPITAL_BASED_OUTPATIENT_CLINIC_OR_DEPARTMENT_OTHER): Payer: 59 | Admitting: Hematology and Oncology

## 2023-01-27 ENCOUNTER — Inpatient Hospital Stay: Payer: 59

## 2023-01-27 VITALS — BP 120/74 | HR 61 | Temp 97.8°F | Resp 18 | Ht 68.5 in | Wt 171.8 lb

## 2023-01-27 VITALS — BP 126/77 | HR 53 | Resp 18

## 2023-01-27 DIAGNOSIS — Z170 Estrogen receptor positive status [ER+]: Secondary | ICD-10-CM

## 2023-01-27 DIAGNOSIS — C50012 Malignant neoplasm of nipple and areola, left female breast: Secondary | ICD-10-CM

## 2023-01-27 DIAGNOSIS — Z5111 Encounter for antineoplastic chemotherapy: Secondary | ICD-10-CM | POA: Diagnosis not present

## 2023-01-27 DIAGNOSIS — Z95828 Presence of other vascular implants and grafts: Secondary | ICD-10-CM

## 2023-01-27 LAB — CBC WITH DIFFERENTIAL (CANCER CENTER ONLY)
Abs Immature Granulocytes: 0 10*3/uL (ref 0.00–0.07)
Basophils Absolute: 0.1 10*3/uL (ref 0.0–0.1)
Basophils Relative: 1 %
Eosinophils Absolute: 0.2 10*3/uL (ref 0.0–0.5)
Eosinophils Relative: 3 %
HCT: 36.6 % (ref 36.0–46.0)
Hemoglobin: 13 g/dL (ref 12.0–15.0)
Immature Granulocytes: 0 %
Lymphocytes Relative: 48 %
Lymphs Abs: 2.4 10*3/uL (ref 0.7–4.0)
MCH: 33.3 pg (ref 26.0–34.0)
MCHC: 35.5 g/dL (ref 30.0–36.0)
MCV: 93.8 fL (ref 80.0–100.0)
Monocytes Absolute: 0.5 10*3/uL (ref 0.1–1.0)
Monocytes Relative: 9 %
Neutro Abs: 1.9 10*3/uL (ref 1.7–7.7)
Neutrophils Relative %: 39 %
Platelet Count: 124 10*3/uL — ABNORMAL LOW (ref 150–400)
RBC: 3.9 MIL/uL (ref 3.87–5.11)
RDW: 13.4 % (ref 11.5–15.5)
WBC Count: 5 10*3/uL (ref 4.0–10.5)
nRBC: 0 % (ref 0.0–0.2)

## 2023-01-27 LAB — CMP (CANCER CENTER ONLY)
ALT: 29 U/L (ref 0–44)
AST: 49 U/L — ABNORMAL HIGH (ref 15–41)
Albumin: 4.2 g/dL (ref 3.5–5.0)
Alkaline Phosphatase: 68 U/L (ref 38–126)
Anion gap: 7 (ref 5–15)
BUN: 16 mg/dL (ref 6–20)
CO2: 27 mmol/L (ref 22–32)
Calcium: 9.5 mg/dL (ref 8.9–10.3)
Chloride: 103 mmol/L (ref 98–111)
Creatinine: 0.93 mg/dL (ref 0.44–1.00)
GFR, Estimated: >60 mL/min (ref >60)
Glucose, Bld: 102 mg/dL — ABNORMAL HIGH (ref 70–99)
Potassium: 3.6 mmol/L (ref 3.5–5.1)
Sodium: 137 mmol/L (ref 135–145)
Total Bilirubin: 1 mg/dL (ref 0.3–1.2)
Total Protein: 7.4 g/dL (ref 6.5–8.1)

## 2023-01-27 MED ORDER — SODIUM CHLORIDE 0.9 % IV SOLN
3.0000 mg/kg | Freq: Once | INTRAVENOUS | Status: AC
  Administered 2023-01-27: 200 mg via INTRAVENOUS
  Filled 2023-01-27: qty 10

## 2023-01-27 MED ORDER — SODIUM CHLORIDE 0.9% FLUSH
10.0000 mL | INTRAVENOUS | Status: DC | PRN
  Administered 2023-01-27: 10 mL

## 2023-01-27 MED ORDER — SODIUM CHLORIDE 0.9% FLUSH
10.0000 mL | Freq: Once | INTRAVENOUS | Status: AC
  Administered 2023-01-27: 10 mL

## 2023-01-27 MED ORDER — CETIRIZINE HCL 10 MG PO TABS
10.0000 mg | ORAL_TABLET | Freq: Once | ORAL | Status: AC
  Administered 2023-01-27: 10 mg via ORAL
  Filled 2023-01-27: qty 1

## 2023-01-27 MED ORDER — SODIUM CHLORIDE 0.9 % IV SOLN: Freq: Once | INTRAVENOUS | Status: AC

## 2023-01-27 MED ORDER — PROCHLORPERAZINE MALEATE 10 MG PO TABS
10.0000 mg | ORAL_TABLET | Freq: Once | ORAL | Status: AC
  Administered 2023-01-27: 10 mg via ORAL
  Filled 2023-01-27: qty 1

## 2023-01-27 MED ORDER — HEPARIN SOD (PORK) LOCK FLUSH 100 UNIT/ML IV SOLN
500.0000 [IU] | Freq: Once | INTRAVENOUS | Status: AC | PRN
  Administered 2023-01-27: 500 [IU]

## 2023-01-27 MED ORDER — ACETAMINOPHEN 325 MG PO TABS
650.0000 mg | ORAL_TABLET | Freq: Once | ORAL | Status: AC
  Administered 2023-01-27: 650 mg via ORAL
  Filled 2023-01-27: qty 2

## 2023-01-27 NOTE — Assessment & Plan Note (Signed)
03/04/2022 History of subpectoral implants, palpable left breast mass by ultrasound 1.4 cm.  Multiple additional masses 1.1 and 0.9 cm.  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  Breast MRI showed at least 7 different masses largest 1.6 cm.  No lymphadenopathy   Treatment plan: 1. Neoadjuvant chemotherapy with TCH  6 cycles completed 07/01/2022 followed byKadcyla maintenance started 09/02/2022 2. left mastectomy with sentinel lymph node study: 08/13/2022 ([facility]): Multifocal grade 2 IDC 8 mm, 5 mm, 6 mm, 0/4 lymph nodes, ER/PR positive HER2 positive, reconstruction 08/21/2022 3. Followed by adjuvant antiestrogen therapy -------------------------------------------------------------------------------------------------------------------------------------------- Current treatment: Kadcyla cycle 8, antiestrogen therapy with tamoxifen (will be started in 3 weeks)   Kadcyla toxicities: Mild intermittent peripheral neuropathy monitoring Fatigue and nausea after each treatment: I reduced the dose of Kadcyla with cycle 7   Tamoxifen toxicities: Severe joint stiffness especially in the shoulders: Tamoxifen was discontinued and she is slowly improving. Swelling of the hands: Started off when she started tamoxifen. We will try to resume tamoxifen after Steward Drone is completed and may be we will start at a lower dosage and titrate upwards.     Return to clinic in 3 weeks for cycle 9

## 2023-01-27 NOTE — Patient Instructions (Signed)
[facility]  Discharge Instructions: Thank you for choosing [facility] to provide your oncology and hematology care.   If you have a lab appointment with the Cancer Center, please go directly to the Cancer Center and check in at the registration area.   Wear comfortable clothing and clothing appropriate for easy access to any Portacath or PICC line.   We strive to give you quality time with your provider. You may need to reschedule your appointment if you arrive late (15 or more minutes).  Arriving late affects you and other patients whose appointments are after yours.  Also, if you miss three or more appointments without notifying the office, you may be dismissed from the clinic at the provider's discretion.      For prescription refill requests, have your pharmacy contact our office and allow 72 hours for refills to be completed.    Today you received the following chemotherapy and/or immunotherapy agents: ado-trastuzumab-emtansine      To help prevent nausea and vomiting after your treatment, we encourage you to take your nausea medication as directed.  BELOW ARE SYMPTOMS THAT SHOULD BE REPORTED IMMEDIATELY: *FEVER GREATER THAN 100.4 F (38 C) OR HIGHER *CHILLS OR SWEATING *NAUSEA AND VOMITING THAT IS NOT CONTROLLED WITH YOUR NAUSEA MEDICATION *UNUSUAL SHORTNESS OF BREATH *UNUSUAL BRUISING OR BLEEDING *URINARY PROBLEMS (pain or burning when urinating, or frequent urination) *BOWEL PROBLEMS (unusual diarrhea, constipation, pain near the anus) TENDERNESS IN MOUTH AND THROAT WITH OR WITHOUT PRESENCE OF ULCERS (sore throat, sores in mouth, or a toothache) UNUSUAL RASH, SWELLING OR PAIN  UNUSUAL VAGINAL DISCHARGE OR ITCHING   Items with * indicate a potential emergency and should be followed up as soon as possible or go to the Emergency Department if any problems should occur.  Please show the CHEMOTHERAPY ALERT CARD or IMMUNOTHERAPY  ALERT CARD at check-in to the Emergency Department and triage nurse.  Should you have questions after your visit or need to cancel or reschedule your appointment, please contact [facility]  Dept: [phone]  and follow the prompts.  Office hours are 8:00 a.m. to 4:30 p.m. Monday - Friday. Please note that voicemails left after 4:00 p.m. may not be returned until the following business day.  We are closed weekends and major holidays. You have access to a nurse at all times for urgent questions. Please call the main number to the clinic Dept: [phone] and follow the prompts.   For any non-urgent questions, you may also contact your provider using MyChart. We now offer e-Visits for anyone 18 and older to request care online for non-urgent symptoms. For details visit [email or web address].   Also download the MyChart app! Go to the app store, search "MyChart", open the app, select [facility], and log in with your MyChart username and password.   

## 2023-01-27 NOTE — Progress Notes (Signed)
Patient declined to stay for 30 minute post-observation period following Kadcyla infusion.  Patient's vital signs retaken prior to discharge and remained within normal parameters.  Patient discharged in stable condition.   

## 2023-01-28 ENCOUNTER — Telehealth: Payer: Self-pay | Admitting: Hematology and Oncology

## 2023-01-28 NOTE — Telephone Encounter (Signed)
Reached out to patient to add more treatments/ appointments per active request/ left voicemail

## 2023-02-05 ENCOUNTER — Other Ambulatory Visit (HOSPITAL_COMMUNITY)
Admission: RE | Admit: 2023-02-05 | Discharge: 2023-02-05 | Disposition: A | Discharge status: Home / Self Care | Payer: 59 | Source: Ambulatory Visit | Attending: Obstetrics & Gynecology | Admitting: Obstetrics & Gynecology

## 2023-02-05 ENCOUNTER — Ambulatory Visit (HOSPITAL_BASED_OUTPATIENT_CLINIC_OR_DEPARTMENT_OTHER): Payer: 59 | Admitting: Obstetrics & Gynecology

## 2023-02-05 ENCOUNTER — Encounter (HOSPITAL_BASED_OUTPATIENT_CLINIC_OR_DEPARTMENT_OTHER): Payer: Self-pay | Admitting: Obstetrics & Gynecology

## 2023-02-05 VITALS — BP 122/73 | HR 66 | Ht 68.0 in | Wt 175.6 lb

## 2023-02-05 DIAGNOSIS — N921 Excessive and frequent menstruation with irregular cycle: Secondary | ICD-10-CM
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
## 2023-02-09 LAB — SURGICAL PATHOLOGY

## 2023-02-10 ENCOUNTER — Ambulatory Visit (HOSPITAL_COMMUNITY): Payer: 59 | Attending: Cardiology

## 2023-02-10 DIAGNOSIS — I361 Nonrheumatic tricuspid (valve) insufficiency: Secondary | ICD-10-CM | POA: Diagnosis not present

## 2023-02-10 DIAGNOSIS — I427 Cardiomyopathy due to drug and external agent: Secondary | ICD-10-CM | POA: Diagnosis not present

## 2023-02-10 LAB — ECHOCARDIOGRAM LIMITED
Area-P 1/2: 3.17 cm2
S' Lateral: 3.1 cm

## 2023-02-12 ENCOUNTER — Other Ambulatory Visit: Payer: Self-pay

## 2023-02-12 DIAGNOSIS — I427 Cardiomyopathy due to drug and external agent: Secondary | ICD-10-CM

## 2023-02-13 NOTE — Progress Notes (Signed)
Patient Care Team: Jerene BearsMiller, Mary S, MD as PCP - General (Obstetrics and Gynecology) Maisie FusBranch, Mary E, MD as PCP - Cardiology (Cardiology) Pershing ProudStuart, Dawn C, RN as Oncology Nurse Navigator Donnelly AngelicaMartini, Keisha N, RN as Oncology Nurse Navigator Serena CroissantGudena, Vinay, MD as Consulting Physician (Hematology and Oncology) Emelia LoronWakefield, Matthew, MD as Consulting Physician (General Surgery)  DIAGNOSIS: No diagnosis found.  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  Multiple additional masses 1.1 and 0.9 cm.  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  Breast MRI showed at least 7 different masses largest 1.6 cm.  No lymphadenopathy   03/04/2022 Cancer Staging   Staging form: Breast, AJCC 8th Edition - Clinical stage from 03/04/2022: Stage IA (cT1c, cN0, cM0, G2, ER+, PR+, HER2+) - Signed by Loa Socksausey, Lindsey Cornetto, NP on 11/13/2022 Stage prefix: Initial diagnosis Histologic grading system: 3 grade system   03/17/2022 Genetic Testing   Negative genetic testing through the CancerNext performed at [facility].  The report date is Mar 17, 2022.  The CancerNext gene panel offered by [employer] [facility] includes sequencing and rearrangement analysis for the following 34 genes:   APC, ATM, BARD1, BMPR1A, BRCA1, BRCA2, BRIP1, CDH1, CDK4, CDKN2A, CHEK2, DICER1, HOXB13, EPCAM, GREM1, MLH1, MRE11A, MSH2, MSH6, MUTYH, NBN, NF1, PALB2, PMS2, POLD1, POLE, PTEN, RAD50, RAD51C, RAD51D, SMAD4, SMARCA4, STK11, and TP53.     03/18/2022 - 08/12/2022 Chemotherapy   Patient is on Treatment Plan : BREAST Docetaxel + Carboplatin + Trastuzumab (TCH) q21d / Trastuzumab q21d     08/13/2022 Surgery   Left mastectomy with sentinel lymph node study: 08/13/2022 ([facility]): Multifocal  grade 2 IDC 8 mm, 5 mm, 6 mm, 0/4 lymph nodes, ER/PR positive HER2 positive, reconstruction 08/21/2022    08/13/2022 Cancer Staging   Staging form: Breast, AJCC 8th Edition - Pathologic stage from 08/13/2022: No Stage Recommended (ypT1b, pN0, cM0, G2, ER+, PR+, HER2+) - Signed by Loa Socksausey, Lindsey Cornetto, NP on 11/13/2022 Stage prefix: Post-therapy Histologic grading system: 3 grade system   09/02/2022 -  Chemotherapy   Patient is on Treatment Plan : BREAST ADO-Trastuzumab Emtansine (Kadcyla) q21d     10/2022 -  Anti-estrogen oral therapy   Tamoxifen daily     CHIEF COMPLIANT: Follow-up Kadcyla cycle 9    INTERVAL HISTORY: [name] is a 42 y.o with left breast estrogen receptor positive Currently on antiestrogen therapy with tamoxifen and treatment with Kadcyla. She presents to the clinic for a follow-up.   ALLERGIES:  is allergic to sulfamethoxazole-trimethoprim and wound dressing adhesive.  MEDICATIONS:  Current Outpatient Medications  Medication Sig Dispense Refill   ALPRAZolam (XANAX) 0.25 MG tablet Take 0.25 mg by mouth.     hydrOXYzine (ATARAX) 10 MG tablet Take 1 tablet (10 mg total) by mouth 3 (three) times daily as needed. 30 tablet 0   lidocaine-prilocaine (EMLA) cream Apply to affected area once 30 g 3   prochlorperazine (COMPAZINE) 10 MG tablet Take 1 tablet (10 mg total) by mouth every 6 (six) hours as needed for nausea or vomiting. 30 tablet 3   tranexamic acid (LYSTEDA) 650 MG TABS tablet Take 2 tablets (1,300 mg total) by mouth 3 (three) times daily. 30 tablet 1   VEOZAH 45 MG TABS TAKE 1 TABLET BY MOUTH EVERY DAY (Patient  not taking: Reported on 02/05/2023) 30 tablet 2   No current facility-administered medications for this visit.    PHYSICAL EXAMINATION: ECOG PERFORMANCE STATUS: {CHL ONC ECOG PS:[id number]}  There were no vitals filed for this visit. There were no vitals filed for this visit.  BREAST:*** No palpable masses or nodules in either right or  left breasts. No palpable axillary supraclavicular or infraclavicular adenopathy no breast tenderness or nipple discharge. (exam performed in the presence of a chaperone)  LABORATORY DATA:  I have reviewed the data as listed    Latest Ref Rng & Units 01/27/2023    8:09 AM 01/22/2023    1:24 PM 01/05/2023    7:50 AM  CMP  Glucose 70 - 99 mg/dL 888  916  945   BUN 6 - 20 mg/dL 16  16  19    Creatinine 0.44 - 1.00 mg/dL 0.38  8.82  8.00   Sodium 135 - 145 mmol/L 137  139  137   Potassium 3.5 - 5.1 mmol/L 3.6  3.7  3.6   Chloride 98 - 111 mmol/L 103  106  103   CO2 22 - 32 mmol/L 27  26  28    Calcium 8.9 - 10.3 mg/dL 9.5  9.8  8.6   Total Protein 6.5 - 8.1 g/dL 7.4  7.9  7.1   Total Bilirubin 0.3 - 1.2 mg/dL 1.0  1.0  0.6   Alkaline Phos 38 - 126 U/L 68  68  67   AST 15 - 41 U/L 49  54  40   ALT 0 - 44 U/L 29  34  27     Lab Results  Component Value Date   WBC 5.0 01/27/2023   HGB 13.0 01/27/2023   HCT 36.6 01/27/2023   MCV 93.8 01/27/2023   PLT 124 (L) 01/27/2023   NEUTROABS 1.9 01/27/2023    ASSESSMENT & PLAN:  No problem-specific Assessment & Plan notes found for this encounter.    No orders of the defined types were placed in this encounter.  The patient has a good understanding of the overall plan. she agrees with it. she will call with any problems that may develop before the next visit here. Total time spent: 30 mins including face to face time and time spent for planning, charting and co-ordination of care   Sherlyn Lick, CMA 02/13/23    I Janan Ridge am acting as a Neurosurgeon for [employer]  ***

## 2023-02-16 ENCOUNTER — Inpatient Hospital Stay: Payer: 59 | Attending: Hematology and Oncology

## 2023-02-16 ENCOUNTER — Inpatient Hospital Stay: Payer: 59

## 2023-02-16 ENCOUNTER — Inpatient Hospital Stay (HOSPITAL_BASED_OUTPATIENT_CLINIC_OR_DEPARTMENT_OTHER): Payer: 59 | Admitting: Hematology and Oncology

## 2023-02-16 VITALS — BP 118/70 | HR 54 | Resp 18

## 2023-02-16 VITALS — BP 136/79 | HR 62 | Temp 98.2°F | Resp 18 | Ht 68.0 in | Wt 176.3 lb

## 2023-02-16 DIAGNOSIS — M79622 Pain in left upper arm: Secondary | ICD-10-CM | POA: Diagnosis not present

## 2023-02-16 DIAGNOSIS — N938 Other specified abnormal uterine and vaginal bleeding: Secondary | ICD-10-CM | POA: Insufficient documentation

## 2023-02-16 DIAGNOSIS — C50012 Malignant neoplasm of nipple and areola, left female breast: Secondary | ICD-10-CM

## 2023-02-16 DIAGNOSIS — R5383 Other fatigue: Secondary | ICD-10-CM | POA: Diagnosis not present

## 2023-02-16 DIAGNOSIS — R110 Nausea: Secondary | ICD-10-CM | POA: Diagnosis not present

## 2023-02-16 DIAGNOSIS — D696 Thrombocytopenia, unspecified: Secondary | ICD-10-CM | POA: Diagnosis not present

## 2023-02-16 DIAGNOSIS — Z5111 Encounter for antineoplastic chemotherapy: Secondary | ICD-10-CM | POA: Diagnosis present

## 2023-02-16 DIAGNOSIS — R197 Diarrhea, unspecified: Secondary | ICD-10-CM | POA: Diagnosis not present

## 2023-02-16 DIAGNOSIS — Z79810 Long term (current) use of selective estrogen receptor modulators (SERMs): Secondary | ICD-10-CM | POA: Diagnosis not present

## 2023-02-16 DIAGNOSIS — M7989 Other specified soft tissue disorders: Secondary | ICD-10-CM | POA: Insufficient documentation

## 2023-02-16 DIAGNOSIS — Z79899 Other long term (current) drug therapy: Secondary | ICD-10-CM | POA: Diagnosis not present

## 2023-02-16 DIAGNOSIS — Z803 Family history of malignant neoplasm of breast: Secondary | ICD-10-CM | POA: Diagnosis not present

## 2023-02-16 DIAGNOSIS — M25612 Stiffness of left shoulder, not elsewhere classified: Secondary | ICD-10-CM | POA: Insufficient documentation

## 2023-02-16 DIAGNOSIS — Z9012 Acquired absence of left breast and nipple: Secondary | ICD-10-CM | POA: Diagnosis not present

## 2023-02-16 DIAGNOSIS — Z170 Estrogen receptor positive status [ER+]: Secondary | ICD-10-CM | POA: Insufficient documentation

## 2023-02-16 DIAGNOSIS — M25611 Stiffness of right shoulder, not elsewhere classified: Secondary | ICD-10-CM | POA: Diagnosis not present

## 2023-02-16 DIAGNOSIS — G629 Polyneuropathy, unspecified: Secondary | ICD-10-CM | POA: Insufficient documentation

## 2023-02-16 LAB — CBC WITH DIFFERENTIAL (CANCER CENTER ONLY)
Abs Immature Granulocytes: 0.01 10*3/uL (ref 0.00–0.07)
Basophils Absolute: 0 10*3/uL (ref 0.0–0.1)
Basophils Relative: 1 %
Eosinophils Absolute: 0.2 10*3/uL (ref 0.0–0.5)
Eosinophils Relative: 4 %
HCT: 37.6 % (ref 36.0–46.0)
Hemoglobin: 13 g/dL (ref 12.0–15.0)
Immature Granulocytes: 0 %
Lymphocytes Relative: 43 %
Lymphs Abs: 2 10*3/uL (ref 0.7–4.0)
MCH: 32.9 pg (ref 26.0–34.0)
MCHC: 34.6 g/dL (ref 30.0–36.0)
MCV: 95.2 fL (ref 80.0–100.0)
Monocytes Absolute: 0.4 10*3/uL (ref 0.1–1.0)
Monocytes Relative: 8 %
Neutro Abs: 2 10*3/uL (ref 1.7–7.7)
Neutrophils Relative %: 44 %
Platelet Count: 103 10*3/uL — ABNORMAL LOW (ref 150–400)
RBC: 3.95 MIL/uL (ref 3.87–5.11)
RDW: 13.4 % (ref 11.5–15.5)
WBC Count: 4.5 10*3/uL (ref 4.0–10.5)
nRBC: 0 % (ref 0.0–0.2)

## 2023-02-16 LAB — CMP (CANCER CENTER ONLY)
ALT: 31 U/L (ref 0–44)
AST: 48 U/L — ABNORMAL HIGH (ref 15–41)
Albumin: 4 g/dL (ref 3.5–5.0)
Alkaline Phosphatase: 53 U/L (ref 38–126)
Anion gap: 6 (ref 5–15)
BUN: 13 mg/dL (ref 6–20)
CO2: 26 mmol/L (ref 22–32)
Calcium: 9.6 mg/dL (ref 8.9–10.3)
Chloride: 105 mmol/L (ref 98–111)
Creatinine: 0.9 mg/dL (ref 0.44–1.00)
GFR, Estimated: >60 mL/min (ref >60)
Glucose, Bld: 104 mg/dL — ABNORMAL HIGH (ref 70–99)
Potassium: 3.7 mmol/L (ref 3.5–5.1)
Sodium: 137 mmol/L (ref 135–145)
Total Bilirubin: 1.2 mg/dL (ref 0.3–1.2)
Total Protein: 7.1 g/dL (ref 6.5–8.1)

## 2023-02-16 MED ORDER — HEPARIN SOD (PORK) LOCK FLUSH 100 UNIT/ML IV SOLN
500.0000 [IU] | Freq: Once | INTRAVENOUS | Status: AC | PRN
  Administered 2023-02-16: 500 [IU]

## 2023-02-16 MED ORDER — SODIUM CHLORIDE 0.9 % IV SOLN: Freq: Once | INTRAVENOUS | Status: AC

## 2023-02-16 MED ORDER — SODIUM CHLORIDE 0.9 % IV SOLN
3.0000 mg/kg | Freq: Once | INTRAVENOUS | Status: AC
  Administered 2023-02-16: 200 mg via INTRAVENOUS
  Filled 2023-02-16: qty 10

## 2023-02-16 MED ORDER — CETIRIZINE HCL 10 MG PO TABS
10.0000 mg | ORAL_TABLET | Freq: Once | ORAL | Status: AC
  Administered 2023-02-16: 10 mg via ORAL
  Filled 2023-02-16: qty 1

## 2023-02-16 MED ORDER — PROCHLORPERAZINE MALEATE 10 MG PO TABS
10.0000 mg | ORAL_TABLET | Freq: Once | ORAL | Status: AC
  Administered 2023-02-16: 10 mg via ORAL
  Filled 2023-02-16: qty 1

## 2023-02-16 MED ORDER — ACETAMINOPHEN 325 MG PO TABS
650.0000 mg | ORAL_TABLET | Freq: Once | ORAL | Status: AC
  Administered 2023-02-16: 650 mg via ORAL
  Filled 2023-02-16: qty 2

## 2023-02-16 MED ORDER — SODIUM CHLORIDE 0.9% FLUSH
10.0000 mL | INTRAVENOUS | Status: DC | PRN
  Administered 2023-02-16: 10 mL

## 2023-02-16 NOTE — Patient Instructions (Signed)
[facility]  Discharge Instructions: Thank you for choosing [facility] to provide your oncology and hematology care.   If you have a lab appointment with the Cancer Center, please go directly to the Cancer Center and check in at the registration area.   Wear comfortable clothing and clothing appropriate for easy access to any Portacath or PICC line.   We strive to give you quality time with your provider. You may need to reschedule your appointment if you arrive late (15 or more minutes).  Arriving late affects you and other patients whose appointments are after yours.  Also, if you miss three or more appointments without notifying the office, you may be dismissed from the clinic at the provider's discretion.      For prescription refill requests, have your pharmacy contact our office and allow 72 hours for refills to be completed.    Today you received the following chemotherapy and/or immunotherapy agents: ado-trastuzumab-emtansine      To help prevent nausea and vomiting after your treatment, we encourage you to take your nausea medication as directed.  BELOW ARE SYMPTOMS THAT SHOULD BE REPORTED IMMEDIATELY: *FEVER GREATER THAN 100.4 F (38 C) OR HIGHER *CHILLS OR SWEATING *NAUSEA AND VOMITING THAT IS NOT CONTROLLED WITH YOUR NAUSEA MEDICATION *UNUSUAL SHORTNESS OF BREATH *UNUSUAL BRUISING OR BLEEDING *URINARY PROBLEMS (pain or burning when urinating, or frequent urination) *BOWEL PROBLEMS (unusual diarrhea, constipation, pain near the anus) TENDERNESS IN MOUTH AND THROAT WITH OR WITHOUT PRESENCE OF ULCERS (sore throat, sores in mouth, or a toothache) UNUSUAL RASH, SWELLING OR PAIN  UNUSUAL VAGINAL DISCHARGE OR ITCHING   Items with * indicate a potential emergency and should be followed up as soon as possible or go to the Emergency Department if any problems should occur.  Please show the CHEMOTHERAPY ALERT CARD or IMMUNOTHERAPY  ALERT CARD at check-in to the Emergency Department and triage nurse.  Should you have questions after your visit or need to cancel or reschedule your appointment, please contact [facility]  Dept: [phone]  and follow the prompts.  Office hours are 8:00 a.m. to 4:30 p.m. Monday - Friday. Please note that voicemails left after 4:00 p.m. may not be returned until the following business day.  We are closed weekends and major holidays. You have access to a nurse at all times for urgent questions. Please call the main number to the clinic Dept: [phone] and follow the prompts.   For any non-urgent questions, you may also contact your provider using MyChart. We now offer e-Visits for anyone 18 and older to request care online for non-urgent symptoms. For details visit [email or web address].   Also download the MyChart app! Go to the app store, search "MyChart", open the app, select , and log in with your MyChart username and password.   

## 2023-02-16 NOTE — Assessment & Plan Note (Addendum)
03/04/2022 History of subpectoral implants, palpable left breast mass by ultrasound 1.4 cm.  Multiple additional masses 1.1 and 0.9 cm.  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  Breast MRI showed at least 7 different masses largest 1.6 cm.  No lymphadenopathy   Treatment plan: 1. Neoadjuvant chemotherapy with TCH  6 cycles completed 07/01/2022 followed byKadcyla maintenance started 09/02/2022 2. left mastectomy with sentinel lymph node study: 08/13/2022 ([facility]): Multifocal grade 2 IDC 8 mm, 5 mm, 6 mm, 0/4 lymph nodes, ER/PR positive HER2 positive, reconstruction 08/21/2022 3. Followed by adjuvant antiestrogen therapy -------------------------------------------------------------------------------------------------------------------------------------------- Current treatment: Kadcyla cycle 9   Kadcyla toxicities: Mild intermittent peripheral neuropathy monitoring Fatigue and nausea after each treatment: I reduced the dose of Kadcyla with cycle 7   Tamoxifen toxicities: (On hold) Severe joint stiffness especially in the shoulders: Tamoxifen was discontinued and she is slowly improving. Swelling of the hands: Started off when she started tamoxifen. We will try to resume tamoxifen after Daiva Huge is completed and may be we will start at a lower dosage and titrate upwards.   Resumption of menstrual cycles: Patient had very heavy bleeding and she has seen her gynecologist who performed an endometrial biopsy which was benign.  She is going to have a breast ultrasound later on.  The bleeding was related to a polyp.  She is going to [employer] and to [place] next week.   Return to clinic in 3 weeks for cycle 10

## 2023-02-16 NOTE — Progress Notes (Signed)
Patient declined to stay for 30 minute post infusion observation period. Vital signs consistent with previous at time of discharge. No hx adverse events with therapy per patient. Ambulated independently to lobby.

## 2023-02-17 ENCOUNTER — Ambulatory Visit: Payer: 59 | Admitting: Hematology and Oncology

## 2023-02-17 ENCOUNTER — Ambulatory Visit: Payer: 59

## 2023-02-17 ENCOUNTER — Other Ambulatory Visit: Payer: 59

## 2023-03-10 ENCOUNTER — Encounter: Payer: Self-pay | Admitting: Adult Health

## 2023-03-10 ENCOUNTER — Inpatient Hospital Stay (HOSPITAL_BASED_OUTPATIENT_CLINIC_OR_DEPARTMENT_OTHER): Payer: 59 | Admitting: Adult Health

## 2023-03-10 ENCOUNTER — Inpatient Hospital Stay: Payer: 59

## 2023-03-10 ENCOUNTER — Other Ambulatory Visit: Payer: Self-pay

## 2023-03-10 VITALS — HR 62

## 2023-03-10 DIAGNOSIS — C50012 Malignant neoplasm of nipple and areola, left female breast: Secondary | ICD-10-CM

## 2023-03-10 DIAGNOSIS — Z170 Estrogen receptor positive status [ER+]: Secondary | ICD-10-CM

## 2023-03-10 DIAGNOSIS — Z95828 Presence of other vascular implants and grafts: Secondary | ICD-10-CM

## 2023-03-10 DIAGNOSIS — Z5111 Encounter for antineoplastic chemotherapy: Secondary | ICD-10-CM | POA: Diagnosis not present

## 2023-03-10 LAB — CMP (CANCER CENTER ONLY)
ALT: 35 U/L (ref 0–44)
AST: 55 U/L — ABNORMAL HIGH (ref 15–41)
Albumin: 4 g/dL (ref 3.5–5.0)
Alkaline Phosphatase: 57 U/L (ref 38–126)
Anion gap: 6 (ref 5–15)
BUN: 17 mg/dL (ref 6–20)
CO2: 25 mmol/L (ref 22–32)
Calcium: 9.6 mg/dL (ref 8.9–10.3)
Chloride: 106 mmol/L (ref 98–111)
Creatinine: 0.91 mg/dL (ref 0.44–1.00)
GFR, Estimated: >60 mL/min (ref >60)
Glucose, Bld: 100 mg/dL — ABNORMAL HIGH (ref 70–99)
Potassium: 3.7 mmol/L (ref 3.5–5.1)
Sodium: 137 mmol/L (ref 135–145)
Total Bilirubin: 1.4 mg/dL — ABNORMAL HIGH (ref 0.3–1.2)
Total Protein: 7.2 g/dL (ref 6.5–8.1)

## 2023-03-10 LAB — CBC WITH DIFFERENTIAL (CANCER CENTER ONLY)
Abs Immature Granulocytes: 0.01 10*3/uL (ref 0.00–0.07)
Basophils Absolute: 0.1 10*3/uL (ref 0.0–0.1)
Basophils Relative: 1 %
Eosinophils Absolute: 0.1 10*3/uL (ref 0.0–0.5)
Eosinophils Relative: 2 %
HCT: 37.1 % (ref 36.0–46.0)
Hemoglobin: 13.3 g/dL (ref 12.0–15.0)
Immature Granulocytes: 0 %
Lymphocytes Relative: 41 %
Lymphs Abs: 2.1 10*3/uL (ref 0.7–4.0)
MCH: 34 pg (ref 26.0–34.0)
MCHC: 35.8 g/dL (ref 30.0–36.0)
MCV: 94.9 fL (ref 80.0–100.0)
Monocytes Absolute: 0.5 10*3/uL (ref 0.1–1.0)
Monocytes Relative: 9 %
Neutro Abs: 2.3 10*3/uL (ref 1.7–7.7)
Neutrophils Relative %: 47 %
Platelet Count: 142 10*3/uL — ABNORMAL LOW (ref 150–400)
RBC: 3.91 MIL/uL (ref 3.87–5.11)
RDW: 14.2 % (ref 11.5–15.5)
WBC Count: 5 10*3/uL (ref 4.0–10.5)
nRBC: 0 % (ref 0.0–0.2)

## 2023-03-10 MED ORDER — PROCHLORPERAZINE MALEATE 10 MG PO TABS
10.0000 mg | ORAL_TABLET | Freq: Once | ORAL | Status: AC
  Administered 2023-03-10: 10 mg via ORAL
  Filled 2023-03-10: qty 1

## 2023-03-10 MED ORDER — HEPARIN SOD (PORK) LOCK FLUSH 100 UNIT/ML IV SOLN
500.0000 [IU] | Freq: Once | INTRAVENOUS | Status: AC | PRN
  Administered 2023-03-10: 500 [IU]

## 2023-03-10 MED ORDER — SODIUM CHLORIDE 0.9% FLUSH
10.0000 mL | INTRAVENOUS | Status: DC | PRN
  Administered 2023-03-10: 10 mL

## 2023-03-10 MED ORDER — ACETAMINOPHEN 325 MG PO TABS
650.0000 mg | ORAL_TABLET | Freq: Once | ORAL | Status: AC
  Administered 2023-03-10: 650 mg via ORAL
  Filled 2023-03-10: qty 2

## 2023-03-10 MED ORDER — SODIUM CHLORIDE 0.9 % IV SOLN: Freq: Once | INTRAVENOUS | Status: AC

## 2023-03-10 MED ORDER — SODIUM CHLORIDE 0.9 % IV SOLN
3.0000 mg/kg | Freq: Once | INTRAVENOUS | Status: AC
  Administered 2023-03-10: 200 mg via INTRAVENOUS
  Filled 2023-03-10: qty 10

## 2023-03-10 MED ORDER — SODIUM CHLORIDE 0.9% FLUSH
10.0000 mL | Freq: Once | INTRAVENOUS | Status: AC
  Administered 2023-03-10: 10 mL

## 2023-03-10 MED ORDER — CETIRIZINE HCL 10 MG PO TABS
10.0000 mg | ORAL_TABLET | Freq: Once | ORAL | Status: AC
  Administered 2023-03-10: 10 mg via ORAL
  Filled 2023-03-10: qty 1

## 2023-03-10 NOTE — Assessment & Plan Note (Signed)
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  She is status post neoadjuvant chemotherapy, left mastectomy, adjuvant Kadcyla, and antiestrogen therapy to begin once Kadcyla is completed.   Treatment plan: 1. Neoadjuvant chemotherapy with TCH  6 cycles completed 07/01/2022 followed byKadcyla maintenance started 09/02/2022 2. left mastectomy with sentinel lymph node study: 08/13/2022 ([facility]): Multifocal grade 2 IDC 8 mm, 5 mm, 6 mm, 0/4 lymph nodes, ER/PR positive HER2 positive, reconstruction 08/21/2022 3. Followed by adjuvant antiestrogen therapy -------------------------------------------------------------------------------------------------------------------------------------------- Current treatment: Kadcyla cycle 10   Kadcyla toxicities: Mild intermittent peripheral neuropathy monitoring Fatigue and nausea after each treatment: s/p dose reduction, managed with energy conservation and Compazine.   Tamoxifen toxicities: (On hold) Severe joint stiffness especially in the shoulders: Improved off tamoxifen Swelling of the hands: Began with Tamoxifen, will attempt to restart at lower dose and titrate up slowly.  Dysfunctional uterine bleeding: Her most recent menses was 12 days long.  She is going to undergo pelvic ultrasound tomorrow with gynecology.  She is status post endometrial biopsy that was benign.  She will continue to follow-up with gynecology about this.
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
## 2023-03-10 NOTE — Patient Instructions (Signed)
[place] [facility]  Discharge Instructions: Thank you for choosing [facility] to provide your oncology and hematology care.   If you have a lab appointment with the Cancer Center, please go directly to the Cancer Center and check in at the registration area.   Wear comfortable clothing and clothing appropriate for easy access to any Portacath or PICC line.   We strive to give you quality time with your provider. You may need to reschedule your appointment if you arrive late (15 or more minutes).  Arriving late affects you and other patients whose appointments are after yours.  Also, if you miss three or more appointments without notifying the office, you may be dismissed from the clinic at the provider's discretion.      For prescription refill requests, have your pharmacy contact our office and allow 72 hours for refills to be completed.    Today you received the following chemotherapy and/or immunotherapy agents Herceptin     To help prevent nausea and vomiting after your treatment, we encourage you to take your nausea medication as directed.  BELOW ARE SYMPTOMS THAT SHOULD BE REPORTED IMMEDIATELY: *FEVER GREATER THAN 100.4 F (38 C) OR HIGHER *CHILLS OR SWEATING *NAUSEA AND VOMITING THAT IS NOT CONTROLLED WITH YOUR NAUSEA MEDICATION *UNUSUAL SHORTNESS OF BREATH *UNUSUAL BRUISING OR BLEEDING *URINARY PROBLEMS (pain or burning when urinating, or frequent urination) *BOWEL PROBLEMS (unusual diarrhea, constipation, pain near the anus) TENDERNESS IN MOUTH AND THROAT WITH OR WITHOUT PRESENCE OF ULCERS (sore throat, sores in mouth, or a toothache) UNUSUAL RASH, SWELLING OR PAIN  UNUSUAL VAGINAL DISCHARGE OR ITCHING   Items with * indicate a potential emergency and should be followed up as soon as possible or go to the Emergency Department if any problems should occur.  Please show the CHEMOTHERAPY ALERT CARD or IMMUNOTHERAPY ALERT CARD at  check-in to the Emergency Department and triage nurse.  Should you have questions after your visit or need to cancel or reschedule your appointment, please contact [place] [facility]  Dept: [phone]  and follow the prompts.  Office hours are 8:00 a.m. to 4:30 p.m. Monday - Friday. Please note that voicemails left after 4:00 p.m. may not be returned until the following business day.  We are closed weekends and major holidays. You have access to a nurse at all times for urgent questions. Please call the main number to the clinic Dept: [phone] and follow the prompts.   For any non-urgent questions, you may also contact your provider using MyChart. We now offer e-Visits for anyone 72 and older to request care online for non-urgent symptoms. For details visit [email or web address].   Also download the MyChart app! Go to the app store, search "MyChart", open the app, select , and log in with your MyChart username and password.

## 2023-03-11 ENCOUNTER — Ambulatory Visit (INDEPENDENT_AMBULATORY_CARE_PROVIDER_SITE_OTHER): Payer: 59

## 2023-03-11 ENCOUNTER — Other Ambulatory Visit (HOSPITAL_BASED_OUTPATIENT_CLINIC_OR_DEPARTMENT_OTHER): Payer: Self-pay | Admitting: Obstetrics & Gynecology

## 2023-03-11 ENCOUNTER — Encounter (HOSPITAL_BASED_OUTPATIENT_CLINIC_OR_DEPARTMENT_OTHER): Payer: Self-pay | Admitting: Obstetrics & Gynecology

## 2023-03-11 ENCOUNTER — Ambulatory Visit (HOSPITAL_BASED_OUTPATIENT_CLINIC_OR_DEPARTMENT_OTHER): Payer: 59 | Admitting: Obstetrics & Gynecology

## 2023-03-11 VITALS — BP 126/74 | HR 69 | Ht 68.0 in | Wt 174.0 lb

## 2023-03-11 DIAGNOSIS — N921 Excessive and frequent menstruation with irregular cycle: Secondary | ICD-10-CM

## 2023-03-11 DIAGNOSIS — Z853 Personal history of malignant neoplasm of breast: Secondary | ICD-10-CM

## 2023-03-11 DIAGNOSIS — N939 Abnormal uterine and vaginal bleeding, unspecified: Secondary | ICD-10-CM

## 2023-03-11 DIAGNOSIS — Z3202 Encounter for pregnancy test, result negative: Secondary | ICD-10-CM

## 2023-03-11 DIAGNOSIS — Z9229 Personal history of other drug therapy: Secondary | ICD-10-CM

## 2023-03-11 DIAGNOSIS — D250 Submucous leiomyoma of uterus: Secondary | ICD-10-CM

## 2023-03-11 LAB — POCT URINE PREGNANCY: Preg Test, Ur: NEGATIVE

## 2023-03-11 MED ORDER — TRANEXAMIC ACID 650 MG PO TABS
1300.0000 mg | ORAL_TABLET | Freq: Three times a day (TID) | ORAL | 1 refills | Status: DC
Start: 2023-03-11 — End: 2023-10-28

## 2023-03-12 ENCOUNTER — Telehealth: Payer: Self-pay | Admitting: Adult Health

## 2023-03-12 NOTE — Telephone Encounter (Signed)
Scheduled appointments per WQ. Patient is aware of the made appointments.  

## 2023-03-13 ENCOUNTER — Encounter (HOSPITAL_BASED_OUTPATIENT_CLINIC_OR_DEPARTMENT_OTHER): Payer: Self-pay | Admitting: Obstetrics & Gynecology

## 2023-03-16 ENCOUNTER — Encounter (HOSPITAL_COMMUNITY): Payer: Self-pay | Admitting: Interventional Radiology
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
## 2023-03-16 NOTE — Addendum Note (Signed)
Addended by: Jerene Bears on: 03/16/2023 06:39 AM   Modules accepted: Orders

## 2023-03-17 ENCOUNTER — Other Ambulatory Visit: Payer: Self-pay | Admitting: Obstetrics & Gynecology

## 2023-03-17 DIAGNOSIS — D250 Submucous leiomyoma of uterus: Secondary | ICD-10-CM

## 2023-03-20 ENCOUNTER — Ambulatory Visit (HOSPITAL_BASED_OUTPATIENT_CLINIC_OR_DEPARTMENT_OTHER)
Admission: RE | Admit: 2023-03-20 | Discharge: 2023-03-20 | Disposition: A | Discharge status: Home / Self Care | Payer: 59 | Source: Ambulatory Visit | Attending: Obstetrics & Gynecology | Admitting: Obstetrics & Gynecology

## 2023-03-20 ENCOUNTER — Ambulatory Visit
Admission: RE | Admit: 2023-03-20 | Discharge: 2023-03-20 | Disposition: A | Discharge status: Home / Self Care | Payer: 59 | Source: Ambulatory Visit | Attending: Obstetrics & Gynecology | Admitting: Obstetrics & Gynecology

## 2023-03-20 DIAGNOSIS — D250 Submucous leiomyoma of uterus: Secondary | ICD-10-CM

## 2023-03-20 DIAGNOSIS — N921 Excessive and frequent menstruation with irregular cycle: Secondary | ICD-10-CM

## 2023-03-20 MED ORDER — GADOBUTROL 1 MMOL/ML IV SOLN
7.5000 mL | Freq: Once | INTRAVENOUS | Status: AC | PRN
  Administered 2023-03-20: 7.5 mL via INTRAVENOUS
  Filled 2023-03-20: qty 7.5
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
## 2023-03-23 ENCOUNTER — Ambulatory Visit: Payer: 59 | Attending: Hematology and Oncology

## 2023-03-23 VITALS — Wt 172.4 lb

## 2023-03-23 DIAGNOSIS — Z483 Aftercare following surgery for neoplasm: Secondary | ICD-10-CM
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
## 2023-03-24 ENCOUNTER — Encounter: Payer: Self-pay | Admitting: Hematology and Oncology

## 2023-03-24 ENCOUNTER — Telehealth: Payer: Self-pay

## 2023-03-24 NOTE — Telephone Encounter (Signed)
Called Pt regarding MyChart message. Pt reports she is NOT taking tamoxifen. Per Dr. Pamelia Hoit, Pt can hold Kadcyla on 04/21/23 if [place] procedure is done on 04/20/23. Pt would like to discuss with Dr. Pamelia Hoit on 03/31/23 MD appt before holding Kadcyla. Note added to MD appt.

## 2023-03-25 ENCOUNTER — Other Ambulatory Visit: Payer: Self-pay | Admitting: Interventional Radiology

## 2023-03-25 DIAGNOSIS — D250 Submucous leiomyoma of uterus: Secondary | ICD-10-CM

## 2023-03-31 ENCOUNTER — Ambulatory Visit: Payer: 59 | Admitting: Hematology and Oncology

## 2023-03-31 ENCOUNTER — Inpatient Hospital Stay: Payer: 59 | Attending: Hematology and Oncology

## 2023-03-31 ENCOUNTER — Inpatient Hospital Stay: Payer: 59

## 2023-03-31 ENCOUNTER — Inpatient Hospital Stay (HOSPITAL_BASED_OUTPATIENT_CLINIC_OR_DEPARTMENT_OTHER): Payer: 59 | Admitting: Hematology and Oncology

## 2023-03-31 ENCOUNTER — Ambulatory Visit: Payer: 59

## 2023-03-31 ENCOUNTER — Other Ambulatory Visit: Payer: 59

## 2023-03-31 VITALS — BP 115/74 | HR 55 | Temp 98.0°F | Resp 18

## 2023-03-31 VITALS — BP 129/68 | HR 61 | Temp 97.9°F | Resp 16 | Wt 177.6 lb

## 2023-03-31 DIAGNOSIS — Z79899 Other long term (current) drug therapy: Secondary | ICD-10-CM | POA: Insufficient documentation

## 2023-03-31 DIAGNOSIS — Z170 Estrogen receptor positive status [ER+]: Secondary | ICD-10-CM | POA: Insufficient documentation

## 2023-03-31 DIAGNOSIS — G629 Polyneuropathy, unspecified: Secondary | ICD-10-CM | POA: Insufficient documentation

## 2023-03-31 DIAGNOSIS — Z95828 Presence of other vascular implants and grafts: Secondary | ICD-10-CM

## 2023-03-31 DIAGNOSIS — D696 Thrombocytopenia, unspecified: Secondary | ICD-10-CM | POA: Diagnosis not present

## 2023-03-31 DIAGNOSIS — C50012 Malignant neoplasm of nipple and areola, left female breast: Secondary | ICD-10-CM

## 2023-03-31 DIAGNOSIS — Z9221 Personal history of antineoplastic chemotherapy: Secondary | ICD-10-CM | POA: Diagnosis not present

## 2023-03-31 DIAGNOSIS — Z5112 Encounter for antineoplastic immunotherapy: Secondary | ICD-10-CM | POA: Diagnosis present

## 2023-03-31 DIAGNOSIS — R5383 Other fatigue: Secondary | ICD-10-CM | POA: Diagnosis not present

## 2023-03-31 LAB — CBC WITH DIFFERENTIAL (CANCER CENTER ONLY)
Abs Immature Granulocytes: 0.01 10*3/uL (ref 0.00–0.07)
Basophils Absolute: 0.1 10*3/uL (ref 0.0–0.1)
Basophils Relative: 1 %
Eosinophils Absolute: 0.1 10*3/uL (ref 0.0–0.5)
Eosinophils Relative: 3 %
HCT: 36.7 % (ref 36.0–46.0)
Hemoglobin: 12.7 g/dL (ref 12.0–15.0)
Immature Granulocytes: 0 %
Lymphocytes Relative: 47 %
Lymphs Abs: 2.3 10*3/uL (ref 0.7–4.0)
MCH: 33.2 pg (ref 26.0–34.0)
MCHC: 34.6 g/dL (ref 30.0–36.0)
MCV: 95.8 fL (ref 80.0–100.0)
Monocytes Absolute: 0.4 10*3/uL (ref 0.1–1.0)
Monocytes Relative: 8 %
Neutro Abs: 2 10*3/uL (ref 1.7–7.7)
Neutrophils Relative %: 41 %
Platelet Count: 111 10*3/uL — ABNORMAL LOW (ref 150–400)
RBC: 3.83 MIL/uL — ABNORMAL LOW (ref 3.87–5.11)
RDW: 13.9 % (ref 11.5–15.5)
WBC Count: 4.9 10*3/uL (ref 4.0–10.5)
nRBC: 0 % (ref 0.0–0.2)

## 2023-03-31 LAB — CMP (CANCER CENTER ONLY)
ALT: 36 U/L (ref 0–44)
AST: 53 U/L — ABNORMAL HIGH (ref 15–41)
Albumin: 4 g/dL (ref 3.5–5.0)
Alkaline Phosphatase: 56 U/L (ref 38–126)
Anion gap: 6 (ref 5–15)
BUN: 17 mg/dL (ref 6–20)
CO2: 25 mmol/L (ref 22–32)
Calcium: 9 mg/dL (ref 8.9–10.3)
Chloride: 105 mmol/L (ref 98–111)
Creatinine: 0.86 mg/dL (ref 0.44–1.00)
GFR, Estimated: >60 mL/min (ref >60)
Glucose, Bld: 105 mg/dL — ABNORMAL HIGH (ref 70–99)
Potassium: 3.5 mmol/L (ref 3.5–5.1)
Sodium: 136 mmol/L (ref 135–145)
Total Bilirubin: 1.1 mg/dL (ref 0.3–1.2)
Total Protein: 7 g/dL (ref 6.5–8.1)

## 2023-03-31 MED ORDER — SODIUM CHLORIDE 0.9% FLUSH
10.0000 mL | INTRAVENOUS | Status: DC | PRN
  Administered 2023-03-31: 10 mL

## 2023-03-31 MED ORDER — SODIUM CHLORIDE 0.9 % IV SOLN
3.0000 mg/kg | Freq: Once | INTRAVENOUS | Status: AC
  Administered 2023-03-31: 200 mg via INTRAVENOUS
  Filled 2023-03-31: qty 10

## 2023-03-31 MED ORDER — SODIUM CHLORIDE 0.9% FLUSH
10.0000 mL | Freq: Once | INTRAVENOUS | Status: AC
  Administered 2023-03-31: 10 mL

## 2023-03-31 MED ORDER — ACETAMINOPHEN 325 MG PO TABS
650.0000 mg | ORAL_TABLET | Freq: Once | ORAL | Status: AC
  Administered 2023-03-31: 650 mg via ORAL
  Filled 2023-03-31: qty 2

## 2023-03-31 MED ORDER — SODIUM CHLORIDE 0.9 % IV SOLN: Freq: Once | INTRAVENOUS | Status: AC

## 2023-03-31 MED ORDER — HEPARIN SOD (PORK) LOCK FLUSH 100 UNIT/ML IV SOLN
500.0000 [IU] | Freq: Once | INTRAVENOUS | Status: AC | PRN
  Administered 2023-03-31: 500 [IU]

## 2023-03-31 MED ORDER — CETIRIZINE HCL 10 MG PO TABS
10.0000 mg | ORAL_TABLET | Freq: Once | ORAL | Status: AC
  Administered 2023-03-31: 10 mg via ORAL
  Filled 2023-03-31: qty 1

## 2023-03-31 MED ORDER — PROCHLORPERAZINE MALEATE 10 MG PO TABS
10.0000 mg | ORAL_TABLET | Freq: Once | ORAL | Status: AC
  Administered 2023-03-31: 10 mg via ORAL
  Filled 2023-03-31: qty 1

## 2023-03-31 NOTE — Progress Notes (Signed)
OK to continue Kadcyla at 200 mg per Dr Pamelia Hoit despite wt.  Ebony Hail, Pharm.D., CPP 03/31/2023@9 :36 AM

## 2023-03-31 NOTE — Progress Notes (Signed)
Patient Care Team: Jerene Bears, MD as PCP - General (Obstetrics and Gynecology) Maisie Fus, MD as PCP - Cardiology (Cardiology) Pershing Proud, RN as Oncology Nurse Navigator Donnelly Angelica, RN as Oncology Nurse Navigator Serena Croissant, MD as Consulting Physician (Hematology and Oncology) Emelia Loron, MD as Consulting Physician (General Surgery)  DIAGNOSIS:  Encounter Diagnosis  Name Primary?   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  Multiple additional masses 1.1 and 0.9 cm.  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  Breast MRI showed at least 7 different masses largest 1.6 cm.  No lymphadenopathy   03/04/2022 Cancer Staging   Staging form: Breast, AJCC 8th Edition - Clinical stage from 03/04/2022: Stage IA (cT1c, cN0, cM0, G2, ER+, PR+, HER2+) - Signed by Loa Socks, NP on 11/13/2022 Stage prefix: Initial diagnosis Histologic grading system: 3 grade system   03/17/2022 Genetic Testing   Negative genetic testing through the CancerNext performed at [facility] office.  The report date is Mar 17, 2022.  The CancerNext gene panel offered by [employer] includes sequencing and rearrangement analysis for the following 34 genes:   APC, ATM, BARD1, BMPR1A, BRCA1, BRCA2, BRIP1, CDH1, CDK4, CDKN2A, CHEK2, DICER1, HOXB13, EPCAM, GREM1, MLH1, MRE11A, MSH2, MSH6, MUTYH, NBN, NF1, PALB2, PMS2, POLD1, POLE, PTEN, RAD50, RAD51C, RAD51D, SMAD4, SMARCA4, STK11, and TP53.     03/18/2022 - 08/12/2022 Chemotherapy   Patient is on Treatment Plan : BREAST Docetaxel + Carboplatin +  Trastuzumab (TCH) q21d / Trastuzumab q21d     08/13/2022 Surgery   Left mastectomy with sentinel lymph node study: 08/13/2022 ([facility]): Multifocal grade 2 IDC 8 mm, 5 mm, 6 mm, 0/4 lymph nodes, ER/PR positive HER2 positive, reconstruction 08/21/2022    08/13/2022 Cancer Staging   Staging form: Breast, AJCC 8th Edition - Pathologic stage from 08/13/2022: No Stage Recommended (ypT1b, pN0, cM0, G2, ER+, PR+, HER2+) - Signed by Loa Socks, NP on 11/13/2022 Stage prefix: Post-therapy Histologic grading system: 3 grade system   09/02/2022 -  Chemotherapy   Patient is on Treatment Plan : BREAST ADO-Trastuzumab Emtansine (Kadcyla) q21d     10/2022 -  Anti-estrogen oral therapy   Tamoxifen daily     CHIEF COMPLIANT: Follow-up Kadcyla cycle 11   INTERVAL HISTORY: [name] is a 42 y.o with left breast estrogen receptor positive Currently on antiestrogen therapy with tamoxifen and treatment with Kadcyla. She presents to the clinic for a follow-up. She reports that she has been doing well. Tolerating treatment extremely well. She says her gums bleed and also she has nose bleed.   ALLERGIES:  is allergic to sulfamethoxazole-trimethoprim and wound dressing adhesive.  MEDICATIONS:  Current Outpatient Medications  Medication Sig Dispense Refill   ALPRAZolam (XANAX) 0.25 MG tablet Take 0.25 mg by mouth.     hydrOXYzine (ATARAX) 10 MG tablet Take 1 tablet (10 mg total) by mouth 3 (three) times daily as needed. 30 tablet 0   ipratropium (ATROVENT) 0.03 % nasal spray 2 sprays in each nostril Nasally Twice a day for 30 days As needed nasal congestion     lidocaine-prilocaine (EMLA) cream Apply  to affected area once 30 g 3   prochlorperazine (COMPAZINE) 10 MG tablet Take 1 tablet (10 mg total) by mouth every 6 (six) hours as needed for nausea or vomiting. 30 tablet 3   tranexamic acid (LYSTEDA) 650 MG TABS tablet Take 2 tablets (1,300 mg total) by mouth 3 (three) times daily. 30 tablet 1    VEOZAH 45 MG TABS TAKE 1 TABLET BY MOUTH EVERY DAY 30 tablet 2   No current facility-administered medications for this visit.    PHYSICAL EXAMINATION: ECOG PERFORMANCE STATUS: 1 - Symptomatic but completely ambulatory  Vitals:   03/31/23 0826  BP: 129/68  Pulse: 61  Resp: 16  Temp: 97.9 F (36.6 C)  SpO2: 100%   Filed Weights   03/31/23 0826  Weight: 177 lb 9.6 oz (80.6 kg)     LABORATORY DATA:  I have reviewed the data as listed    Latest Ref Rng & Units 03/10/2023    9:13 AM 02/16/2023    8:19 AM 01/27/2023    8:09 AM  CMP  Glucose 70 - 99 mg/dL 161  096  045   BUN 6 - 20 mg/dL 17  13  16    Creatinine 0.44 - 1.00 mg/dL 4.09  8.11  9.14   Sodium 135 - 145 mmol/L 137  137  137   Potassium 3.5 - 5.1 mmol/L 3.7  3.7  3.6   Chloride 98 - 111 mmol/L 106  105  103   CO2 22 - 32 mmol/L 25  26  27    Calcium 8.9 - 10.3 mg/dL 9.6  9.6  9.5   Total Protein 6.5 - 8.1 g/dL 7.2  7.1  7.4   Total Bilirubin 0.3 - 1.2 mg/dL 1.4  1.2  1.0   Alkaline Phos 38 - 126 U/L 57  53  68   AST 15 - 41 U/L 55  48  49   ALT 0 - 44 U/L 35  31  29     Lab Results  Component Value Date   WBC 4.9 03/31/2023   HGB 12.7 03/31/2023   HCT 36.7 03/31/2023   MCV 95.8 03/31/2023   PLT 111 (L) 03/31/2023   NEUTROABS 2.0 03/31/2023    ASSESSMENT & PLAN:  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  Multiple additional masses 1.1 and 0.9 cm.  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  Breast MRI showed at least 7 different masses largest 1.6 cm.  No lymphadenopathy   Treatment plan: 1. Neoadjuvant chemotherapy with TCH  6 cycles completed 07/01/2022 followed byKadcyla maintenance started 09/02/2022 2. left mastectomy with sentinel lymph node study: 08/13/2022 ([facility]): Multifocal grade 2 IDC 8 mm, 5 mm, 6 mm,  0/4 lymph nodes, ER/PR positive HER2 positive, reconstruction 08/21/2022 3. Followed by adjuvant antiestrogen therapy -------------------------------------------------------------------------------------------------------------------------------------------- Current treatment: Kadcyla cycle 11   Kadcyla toxicities: Mild intermittent peripheral neuropathy monitoring Fatigue and nausea after each treatment: I reduced the dose of Kadcyla with cycle 7 Thrombocytopenia: Monitoring closely.  If the platelets drop below 100 we may reduce the dosage again.   Tamoxifen toxicities: (On hold) Severe joint stiffness especially in the shoulders: Tamoxifen was discontinued and symptoms improved. Swelling of the hands: Started off when she started tamoxifen. We will try to resume tamoxifen after Daiva Huge is completed and may be we will start  at a lower dosage and titrate upwards.   Resumption of menstrual cycles: Patient had very heavy bleeding and was evaluated by Dr. Hyacinth Meeker who referred the patient to interventional radiology for consideration for embolization.    We will postpone her treatment by 1 day so that she can continue with the treatment plan.   No orders of the defined types were placed in this encounter.  The patient has a good understanding of the overall plan. she agrees with it. she will call with any problems that may develop before the next visit here. Total time spent: 30 mins including face to face time and time spent for planning, charting and co-ordination of care   Tamsen Meek, MD 03/31/23    I Janan Ridge am acting as a Neurosurgeon for [employer]  I have reviewed the above documentation for accuracy and completeness, and I agree with the above.

## 2023-03-31 NOTE — Assessment & Plan Note (Signed)
03/04/2022 History of subpectoral implants, palpable left breast mass by ultrasound 1.4 cm.  Multiple additional masses 1.1 and 0.9 cm.  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  Breast MRI showed at least 7 different masses largest 1.6 cm.  No lymphadenopathy   Treatment plan: 1. Neoadjuvant chemotherapy with TCH  6 cycles completed 07/01/2022 followed byKadcyla maintenance started 09/02/2022 2. left mastectomy with sentinel lymph node study: 08/13/2022 ([facility]): Multifocal grade 2 IDC 8 mm, 5 mm, 6 mm, 0/4 lymph nodes, ER/PR positive HER2 positive, reconstruction 08/21/2022 3. Followed by adjuvant antiestrogen therapy -------------------------------------------------------------------------------------------------------------------------------------------- Current treatment: Kadcyla cycle 9   Kadcyla toxicities: Mild intermittent peripheral neuropathy monitoring Fatigue and nausea after each treatment: I reduced the dose of Kadcyla with cycle 7 Thrombocytopenia: Monitoring closely.  If the platelets drop below 100 we may reduce the dosage again.   Tamoxifen toxicities: (On hold) Severe joint stiffness especially in the shoulders: Tamoxifen was discontinued and symptoms improved. Swelling of the hands: Started off when she started tamoxifen. We will try to resume tamoxifen after Daiva Huge is completed and may be we will start at a lower dosage and titrate upwards.   Resumption of menstrual cycles: Patient had very heavy bleeding and was evaluated by Dr. Hyacinth Meeker who referred the patient to interventional radiology for consideration for embolization.  He will likely hold Kadcyla treatment prior to the procedure.

## 2023-03-31 NOTE — Patient Instructions (Signed)
[facility]  Discharge Instructions: Thank you for choosing [facility] to provide your oncology and hematology care.   If you have a lab appointment with the Cancer Center, please go directly to the Cancer Center and check in at the registration area.   Wear comfortable clothing and clothing appropriate for easy access to any Portacath or PICC line.   We strive to give you quality time with your provider. You may need to reschedule your appointment if you arrive late (15 or more minutes).  Arriving late affects you and other patients whose appointments are after yours.  Also, if you miss three or more appointments without notifying the office, you may be dismissed from the clinic at the provider's discretion.      For prescription refill requests, have your pharmacy contact our office and allow 72 hours for refills to be completed.    Today you received the following chemotherapy and/or immunotherapy agents: ado-trastuzumab-emtansine      To help prevent nausea and vomiting after your treatment, we encourage you to take your nausea medication as directed.  BELOW ARE SYMPTOMS THAT SHOULD BE REPORTED IMMEDIATELY: *FEVER GREATER THAN 100.4 F (38 C) OR HIGHER *CHILLS OR SWEATING *NAUSEA AND VOMITING THAT IS NOT CONTROLLED WITH YOUR NAUSEA MEDICATION *UNUSUAL SHORTNESS OF BREATH *UNUSUAL BRUISING OR BLEEDING *URINARY PROBLEMS (pain or burning when urinating, or frequent urination) *BOWEL PROBLEMS (unusual diarrhea, constipation, pain near the anus) TENDERNESS IN MOUTH AND THROAT WITH OR WITHOUT PRESENCE OF ULCERS (sore throat, sores in mouth, or a toothache) UNUSUAL RASH, SWELLING OR PAIN  UNUSUAL VAGINAL DISCHARGE OR ITCHING   Items with * indicate a potential emergency and should be followed up as soon as possible or go to the Emergency Department if any problems should occur.  Please show the CHEMOTHERAPY ALERT CARD or IMMUNOTHERAPY  ALERT CARD at check-in to the Emergency Department and triage nurse.  Should you have questions after your visit or need to cancel or reschedule your appointment, please contact [facility]  Dept: [phone]  and follow the prompts.  Office hours are 8:00 a.m. to 4:30 p.m. Monday - Friday. Please note that voicemails left after 4:00 p.m. may not be returned until the following business day.  We are closed weekends and major holidays. You have access to a nurse at all times for urgent questions. Please call the main number to the clinic Dept: [phone] and follow the prompts.   For any non-urgent questions, you may also contact your provider using MyChart. We now offer e-Visits for anyone 18 and older to request care online for non-urgent symptoms. For details visit [email or web address].   Also download the MyChart app! Go to the app store, search "MyChart", open the app, select [facility], and log in with your MyChart username and password.   

## 2023-04-02 ENCOUNTER — Encounter: Payer: Self-pay | Admitting: *Deleted

## 2023-04-03 ENCOUNTER — Telehealth: Payer: Self-pay | Admitting: Hematology and Oncology

## 2023-04-03 NOTE — Telephone Encounter (Signed)
Rescheduled appointments per 5/21 los. Patient is aware of the changes made to her upcoming appointments.

## 2023-04-17 ENCOUNTER — Other Ambulatory Visit: Payer: Self-pay | Admitting: Radiology

## 2023-04-17 DIAGNOSIS — D259 Leiomyoma of uterus, unspecified: Secondary | ICD-10-CM
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
## 2023-04-20 ENCOUNTER — Other Ambulatory Visit: Payer: Self-pay | Admitting: Interventional Radiology

## 2023-04-20 ENCOUNTER — Ambulatory Visit (HOSPITAL_COMMUNITY)
Admission: RE | Admit: 2023-04-20 | Discharge: 2023-04-20 | Disposition: A | Discharge status: Home / Self Care | Payer: 59 | Source: Ambulatory Visit | Attending: Interventional Radiology | Admitting: Interventional Radiology

## 2023-04-20 ENCOUNTER — Encounter (HOSPITAL_COMMUNITY): Payer: Self-pay

## 2023-04-20 ENCOUNTER — Observation Stay (HOSPITAL_COMMUNITY)
Admission: RE | Admit: 2023-04-20 | Discharge: 2023-04-21 | Disposition: A | Discharge status: Home / Self Care | Payer: 59 | Source: Ambulatory Visit | Attending: Interventional Radiology | Admitting: Interventional Radiology

## 2023-04-20 ENCOUNTER — Other Ambulatory Visit: Payer: Self-pay

## 2023-04-20 DIAGNOSIS — N920 Excessive and frequent menstruation with regular cycle: Secondary | ICD-10-CM | POA: Insufficient documentation

## 2023-04-20 DIAGNOSIS — D259 Leiomyoma of uterus, unspecified: Principal | ICD-10-CM

## 2023-04-20 DIAGNOSIS — F419 Anxiety disorder, unspecified: Secondary | ICD-10-CM | POA: Diagnosis not present

## 2023-04-20 DIAGNOSIS — R102 Pelvic and perineal pain: Secondary | ICD-10-CM | POA: Diagnosis not present

## 2023-04-20 DIAGNOSIS — Z79899 Other long term (current) drug therapy: Secondary | ICD-10-CM | POA: Diagnosis not present

## 2023-04-20 DIAGNOSIS — Z853 Personal history of malignant neoplasm of breast: Secondary | ICD-10-CM | POA: Diagnosis not present

## 2023-04-20 DIAGNOSIS — Z9012 Acquired absence of left breast and nipple: Secondary | ICD-10-CM | POA: Diagnosis not present

## 2023-04-20 DIAGNOSIS — D250 Submucous leiomyoma of uterus: Secondary | ICD-10-CM

## 2023-04-20 HISTORY — PX: IR EMBO TUMOR ORGAN ISCHEMIA INFARCT INC GUIDE ROADMAPPING: IMG5449

## 2023-04-20 HISTORY — PX: IR ANGIOGRAM SELECTIVE EACH ADDITIONAL VESSEL: IMG667

## 2023-04-20 HISTORY — PX: IR ANGIOGRAM PELVIS SELECTIVE OR SUPRASELECTIVE: IMG661

## 2023-04-20 HISTORY — PX: IR US GUIDE VASC ACCESS RIGHT: IMG2390

## 2023-04-20 LAB — CBC WITH DIFFERENTIAL/PLATELET
Abs Immature Granulocytes: 0 10*3/uL (ref 0.00–0.07)
Basophils Absolute: 0.1 10*3/uL (ref 0.0–0.1)
Basophils Relative: 1 %
Eosinophils Absolute: 0.2 10*3/uL (ref 0.0–0.5)
Eosinophils Relative: 3 %
HCT: 40.1 % (ref 36.0–46.0)
Hemoglobin: 13.5 g/dL (ref 12.0–15.0)
Immature Granulocytes: 0 %
Lymphocytes Relative: 46 %
Lymphs Abs: 2 10*3/uL (ref 0.7–4.0)
MCH: 32.9 pg (ref 26.0–34.0)
MCHC: 33.7 g/dL (ref 30.0–36.0)
MCV: 97.8 fL (ref 80.0–100.0)
Monocytes Absolute: 0.4 10*3/uL (ref 0.1–1.0)
Monocytes Relative: 8 %
Neutro Abs: 1.9 10*3/uL (ref 1.7–7.7)
Neutrophils Relative %: 42 %
Platelets: 120 10*3/uL — ABNORMAL LOW (ref 150–400)
RBC: 4.1 MIL/uL (ref 3.87–5.11)
RDW: 14.3 % (ref 11.5–15.5)
WBC: 4.4 10*3/uL (ref 4.0–10.5)
nRBC: 0 % (ref 0.0–0.2)

## 2023-04-20 LAB — PROTIME-INR
INR: 1 (ref 0.8–1.2)
Prothrombin Time: 13.6 seconds (ref 11.4–15.2)

## 2023-04-20 LAB — BASIC METABOLIC PANEL
Anion gap: 10 (ref 5–15)
BUN: 13 mg/dL (ref 6–20)
CO2: 23 mmol/L (ref 22–32)
Calcium: 9 mg/dL (ref 8.9–10.3)
Chloride: 103 mmol/L (ref 98–111)
Creatinine, Ser: 0.89 mg/dL (ref 0.44–1.00)
GFR, Estimated: >60 mL/min (ref >60)
Glucose, Bld: 110 mg/dL — ABNORMAL HIGH (ref 70–99)
Potassium: 3.5 mmol/L (ref 3.5–5.1)
Sodium: 136 mmol/L (ref 135–145)

## 2023-04-20 LAB — HCG, SERUM, QUALITATIVE: Preg, Serum: NEGATIVE

## 2023-04-20 MED ORDER — FLUMAZENIL 0.5 MG/5ML IV SOLN
INTRAVENOUS | Status: AC
  Filled 2023-04-20: qty 5

## 2023-04-20 MED ORDER — ONDANSETRON HCL 4 MG/2ML IJ SOLN
4.0000 mg | Freq: Four times a day (QID) | INTRAMUSCULAR | Status: DC | PRN
  Filled 2023-04-20: qty 2

## 2023-04-20 MED ORDER — SODIUM CHLORIDE 0.9% FLUSH
3.0000 mL | Freq: Two times a day (BID) | INTRAVENOUS | Status: DC
  Administered 2023-04-21: 3 mL via INTRAVENOUS

## 2023-04-20 MED ORDER — SODIUM CHLORIDE 0.9 % IV SOLN: 25.0000 mg | Freq: Three times a day (TID) | INTRAVENOUS | Status: DC | PRN

## 2023-04-20 MED ORDER — LIDOCAINE HCL 1 % IJ SOLN
INTRAMUSCULAR | Status: AC
  Filled 2023-04-20: qty 20

## 2023-04-20 MED ORDER — SODIUM CHLORIDE 0.9 % IV SOLN: INTRAVENOUS | Status: DC

## 2023-04-20 MED ORDER — ONDANSETRON HCL 4 MG/2ML IJ SOLN
4.0000 mg | Freq: Four times a day (QID) | INTRAMUSCULAR | Status: DC | PRN
  Administered 2023-04-20: 4 mg via INTRAVENOUS

## 2023-04-20 MED ORDER — DIPHENHYDRAMINE HCL 12.5 MG/5ML PO ELIX: 12.5000 mg | ORAL_SOLUTION | Freq: Four times a day (QID) | ORAL | Status: DC | PRN

## 2023-04-20 MED ORDER — FENTANYL CITRATE (PF) 100 MCG/2ML IJ SOLN
INTRAMUSCULAR | Status: AC | PRN
  Administered 2023-04-20 (×4): 50 ug via INTRAVENOUS

## 2023-04-20 MED ORDER — FENTANYL CITRATE (PF) 100 MCG/2ML IJ SOLN
INTRAMUSCULAR | Status: AC
  Filled 2023-04-20: qty 4

## 2023-04-20 MED ORDER — SODIUM CHLORIDE 0.9 % IV SOLN
25.0000 mg | Freq: Three times a day (TID) | INTRAVENOUS | Status: DC | PRN
  Administered 2023-04-20: 25 mg via INTRAVENOUS
  Filled 2023-04-20: qty 25

## 2023-04-20 MED ORDER — DIPHENHYDRAMINE HCL 50 MG/ML IJ SOLN
INTRAMUSCULAR | Status: AC
  Filled 2023-04-20: qty 1

## 2023-04-20 MED ORDER — NALOXONE HCL 0.4 MG/ML IJ SOLN
INTRAMUSCULAR | Status: AC
  Filled 2023-04-20: qty 1

## 2023-04-20 MED ORDER — PROMETHAZINE HCL 25 MG RE SUPP: 25.0000 mg | Freq: Three times a day (TID) | RECTAL | Status: DC | PRN

## 2023-04-20 MED ORDER — KETOROLAC TROMETHAMINE 30 MG/ML IJ SOLN
30.0000 mg | Freq: Once | INTRAMUSCULAR | Status: AC
  Administered 2023-04-20: 30 mg via INTRAVENOUS

## 2023-04-20 MED ORDER — KETOROLAC TROMETHAMINE 30 MG/ML IJ SOLN
INTRAMUSCULAR | Status: AC
  Filled 2023-04-20: qty 1

## 2023-04-20 MED ORDER — PROMETHAZINE HCL 25 MG PO TABS: 25.0000 mg | ORAL_TABLET | Freq: Three times a day (TID) | ORAL | Status: DC | PRN

## 2023-04-20 MED ORDER — SODIUM CHLORIDE 0.9 % IV SOLN: 250.0000 mL | INTRAVENOUS | Status: DC | PRN

## 2023-04-20 MED ORDER — KETOROLAC TROMETHAMINE 30 MG/ML IJ SOLN
30.0000 mg | Freq: Four times a day (QID) | INTRAMUSCULAR | Status: DC
  Administered 2023-04-20 – 2023-04-21 (×4): 30 mg via INTRAVENOUS
  Filled 2023-04-20 (×4): qty 1

## 2023-04-20 MED ORDER — SODIUM CHLORIDE 0.9% FLUSH: 3.0000 mL | INTRAVENOUS | Status: DC | PRN

## 2023-04-20 MED ORDER — DEXAMETHASONE 4 MG PO TABS
8.0000 mg | ORAL_TABLET | Freq: Once | ORAL | Status: AC
  Administered 2023-04-20: 8 mg via ORAL
  Filled 2023-04-20: qty 2

## 2023-04-20 MED ORDER — IOHEXOL 300 MG/ML  SOLN
100.0000 mL | Freq: Once | INTRAMUSCULAR | Status: AC | PRN
  Administered 2023-04-20: 35 mL via INTRA_ARTERIAL

## 2023-04-20 MED ORDER — IOHEXOL 300 MG/ML  SOLN
50.0000 mL | Freq: Once | INTRAMUSCULAR | Status: AC | PRN
  Administered 2023-04-20: 15 mL via INTRA_ARTERIAL

## 2023-04-20 MED ORDER — IOHEXOL 300 MG/ML  SOLN
50.0000 mL | Freq: Once | INTRAMUSCULAR | Status: AC | PRN
  Administered 2023-04-20: 40 mL via INTRA_ARTERIAL

## 2023-04-20 MED ORDER — NALOXONE HCL 0.4 MG/ML IJ SOLN: 0.4000 mg | INTRAMUSCULAR | Status: DC | PRN

## 2023-04-20 MED ORDER — DEXAMETHASONE SODIUM PHOSPHATE 10 MG/ML IJ SOLN
8.0000 mg | Freq: Once | INTRAMUSCULAR | Status: AC
  Administered 2023-04-20: 8 mg via INTRAVENOUS
  Filled 2023-04-20: qty 1

## 2023-04-20 MED ORDER — LIDOCAINE HCL 1 % IJ SOLN
20.0000 mL | Freq: Once | INTRAMUSCULAR | Status: AC
  Administered 2023-04-20: 10 mL via INTRADERMAL

## 2023-04-20 MED ORDER — DIPHENHYDRAMINE HCL 50 MG/ML IJ SOLN: 12.5000 mg | Freq: Four times a day (QID) | INTRAMUSCULAR | Status: DC | PRN

## 2023-04-20 MED ORDER — SODIUM CHLORIDE 0.9% FLUSH: 9.0000 mL | INTRAVENOUS | Status: DC | PRN

## 2023-04-20 MED ORDER — CEFAZOLIN SODIUM-DEXTROSE 2-4 GM/100ML-% IV SOLN
2.0000 g | INTRAVENOUS | Status: AC
  Administered 2023-04-20: 2 g via INTRAVENOUS
  Filled 2023-04-20: qty 100

## 2023-04-20 MED ORDER — CEFAZOLIN SODIUM-DEXTROSE 2-4 GM/100ML-% IV SOLN
INTRAVENOUS | Status: AC
  Filled 2023-04-20: qty 100

## 2023-04-20 MED ORDER — MIDAZOLAM HCL 2 MG/2ML IJ SOLN
INTRAMUSCULAR | Status: AC
  Filled 2023-04-20: qty 4

## 2023-04-20 MED ORDER — LACTATED RINGERS IV SOLN: INTRAVENOUS | Status: DC

## 2023-04-20 MED ORDER — SODIUM CHLORIDE 0.9 % IV SOLN
8.0000 mg | Freq: Once | INTRAVENOUS | Status: AC
  Administered 2023-04-20: 8 mg via INTRAVENOUS
  Filled 2023-04-20: qty 4

## 2023-04-20 MED ORDER — DOCUSATE SODIUM 100 MG PO CAPS
100.0000 mg | ORAL_CAPSULE | Freq: Two times a day (BID) | ORAL | Status: DC
  Administered 2023-04-20 – 2023-04-21 (×2): 100 mg via ORAL
  Filled 2023-04-20 (×2): qty 1

## 2023-04-20 MED ORDER — HYDROMORPHONE 1 MG/ML IV SOLN
INTRAVENOUS | Status: DC
  Administered 2023-04-20: 0.3 mg via INTRAVENOUS
  Administered 2023-04-20: 1.9 mg via INTRAVENOUS
  Administered 2023-04-21: 0.9 mg via INTRAVENOUS
  Administered 2023-04-21: 0.01 mg via INTRAVENOUS
  Administered 2023-04-21 (×2): 0.3 mg via INTRAVENOUS
  Filled 2023-04-20: qty 30

## 2023-04-20 MED ORDER — MIDAZOLAM HCL 2 MG/2ML IJ SOLN
INTRAMUSCULAR | Status: AC | PRN
  Administered 2023-04-20 (×4): 1 mg via INTRAVENOUS

## 2023-04-20 NOTE — Progress Notes (Signed)
Patient ID: [name], female   DOB: 1981/10/28, 42 y.o.   MRN: [id number] Pt doing fairly well, s/p bilateral uterine artery embolization earlier today; has some mild pelvic cramping as expected, denies fever,N/V or resp issues; VSS; AF; abd soft, heating pad in place; mild lower ant abd tenderness; puncture site rt CFA soft,clean,dry, NT, no hematoma; intact distal pulses; yellow urine in foley; for overnight obs; dilaudid PCA for pain ; dc foley later today; advance diet as tolerated; f/u with Dr. Fredia Sorrow in 3-4 weeks; f/u pelvic MRI in 6 months

## 2023-04-20 NOTE — Procedures (Signed)
Interventional Radiology Procedure Note  Procedure: Uterine Fibroid Embolization  Complications: None  Estimated Blood Loss: < 10 mL  Findings: Bilateral uterine arteries identified with selective pelvic internal iliac arteriography bilaterally.  Left uterine artery embolization performed with one vial of 500-700 micron sized Embosphere particles. Right uterine artery embolization performed with 2/3 vial of 500-700 micron sized Embosphere particles. Good stasis of branch vessels achieved bilaterally.   Right CFA closure with AngioSeal device.  Plan: Overnight observation for pain control, treatment of post-embolic symptoms.  Jodi Marble. Fredia Sorrow, M.D Pager:  [phone]

## 2023-04-20 NOTE — Sedation Documentation (Signed)
Angioseal deployed at [employer]

## 2023-04-20 NOTE — Discharge Instructions (Signed)
Uterine Artery Embolization for Fibroids, Care After The following information offers guidance on how to care for yourself after your procedure. Your health care provider may also give you more specific instructions. If you have problems or questions, contact your health care provider. What can I expect after the procedure? After the procedure, it is common to have: Pain or discomfort at the incision site. Cramps in the area between your hips (pelvis). Vaginal bleeding. Vaginal discharge. Nausea and vomiting. Follow these instructions at home: Medicines Take over-the-counter and prescription medicines only as told by your health care provider. If you were prescribed an antibiotic medicine, take it as told by your health care provider. Do not stop using the antibiotic even if you start to feel better. If you are taking blood thinners: Talk with your health care provider before you take any medicines that contain aspirin or NSAIDs, such as ibuprofen. These medicines increase your risk for dangerous bleeding. Take your medicine exactly as told, at the same time every day. Avoid activities that could cause injury or bruising, and follow instructions about how to prevent falls. Wear a medical alert bracelet or carry a card that lists what medicines you take. Ask your health care provider if the medicine prescribed to you: Requires you to avoid driving or using machinery. Can cause constipation. You may need to take these actions to prevent or treat constipation: Drink enough fluid to keep your urine pale yellow. Take over-the-counter or prescription medicines. Eat foods that are high in fiber, such as beans, whole grains, and fresh fruits and vegetables. Limit foods that are high in fat and processed sugars, such as fried or sweet foods. Incision care  Follow instructions from your health care provider about how to take care of your incision. Make sure you: Wash your hands with soap and water  for at least 20 seconds before and after you change your bandage (dressing). If soap and water are not available, use hand sanitizer. Change your dressing as told by your health care provider. Leave stitches (sutures), skin glue, or adhesive strips in place. These skin closures may need to stay in place for 2 weeks or longer. If adhesive strip edges start to loosen and curl up, you may trim the loose edges. Do not remove adhesive strips completely unless your health care provider tells you to do that. Check your incision area every day for signs of infection. Check for: Redness, swelling, or pain. Fluid or blood. Warmth. Pus or a bad smell. Activity Do not lift anything that is heavier than 5 lb (2.3 kg), or the limit that you are told, until your health care provider says that it is safe. Return to your normal activities as told by your health care provider. Ask your health care provider what activities are safe for you. General instructions Do not use any products that contain nicotine or tobacco. These products include cigarettes, chewing tobacco, and vaping devices, such as e-cigarettes. These can delay incision healing. If you need help quitting, ask your health care provider. Do not take baths, swim, or use a hot tub until your health care provider approves. Ask your health care provider if you may take showers. You may only be allowed to take sponge baths. Do not have sex or put anything in your vagina until your health care provider says that this is safe. Do not drink alcohol until your health care provider says it is okay. Wear compression stockings as told by your health care provider. These stockings  help to prevent blood clots and reduce swelling in your legs. Keep all follow-up visits. This is important. Contact a health care provider if: You have a fever. You have more redness, swelling, or pain around your incision. You have more fluid or blood coming from your incision  site. Your incision feels warm to the touch. You have pus or a bad smell coming from your incision. You have a rash. You have nausea, or you cannot eat or drink anything without vomiting. You have a vaginal discharge that is not getting lighter. Get help right away if: You have trouble breathing. You have chest pain. You have severe pain in your abdomen, and it does not get better with medicine. You have leg pain or leg swelling. You feel dizzy, or you faint. These symptoms may represent a serious problem that is an emergency. Do not wait to see if the symptoms will go away. Get medical help right away. Call your local emergency services (911 in the [place] Do not drive yourself to the hospital. Summary After the procedure, it is common to have cramps, or pain or discomfort at the incision site. You will be given pain medicine. Follow instructions from your health care provider about how to take care of your incision. Check your incision area every day for signs of infection. Take over-the-counter and prescription medicines only as told by your health care provider. Contact your health care provider if you have symptoms of infection or other symptoms that do not get better with treatment. This information is not intended to replace advice given to you by your health care provider. Make sure you discuss any questions you have with your health care provider.

## 2023-04-21 ENCOUNTER — Inpatient Hospital Stay: Payer: 59

## 2023-04-21 ENCOUNTER — Inpatient Hospital Stay: Payer: 59 | Admitting: Hematology and Oncology

## 2023-04-21 DIAGNOSIS — D259 Leiomyoma of uterus, unspecified: Secondary | ICD-10-CM | POA: Diagnosis not present

## 2023-04-21 MED ORDER — IBUPROFEN 600 MG PO TABS: 600.0000 mg | ORAL_TABLET | Freq: Four times a day (QID) | ORAL | 0 refills | Status: AC | PRN

## 2023-04-21 MED ORDER — DOCUSATE SODIUM 100 MG PO CAPS: 100.0000 mg | ORAL_CAPSULE | Freq: Every day | ORAL | 0 refills | Status: AC

## 2023-04-21 MED ORDER — HYDROCODONE-ACETAMINOPHEN 5-325 MG PO TABS: 1.0000 | ORAL_TABLET | ORAL | 0 refills | Status: DC | PRN

## 2023-04-21 MED ORDER — PROMETHAZINE HCL 25 MG PO TABS: 25.0000 mg | ORAL_TABLET | Freq: Three times a day (TID) | ORAL | 0 refills | Status: DC | PRN

## 2023-04-21 NOTE — Discharge Summary (Signed)
Patient ID: [name] MRN: [id number] DOB/AGE: Aug 10, 1981 42 y.o.  Admit date: 04/20/2023 Discharge date: 04/21/2023  Supervising Physician: Irish Lack  Patient Status: [facility] - In-pt  Admission Diagnoses: Uterine leiomyoma  Discharge Diagnoses:  Principal Problem:   Uterine leiomyoma   Discharged Condition: stable  Hospital Course: IR.   Patient with history of anxiety, cervical dysplasia, endometrial polyp, migraine headaches, left breast cancer 2023 with prior mastectomy/immunotherapy who presents now with significant menorrhagia/pelvic cramping and uterine fibroids.  s/p  Bilateral uterine arteries identified with selective pelvic internal iliac arteriography bilaterally. 6.11.24 by Dr. Fredia Sorrow. Admitted overnight for observation.    Saw patient in room following procedure. Family at bedside. Complains of expected abdominal  mild "cramping" No additional complaints. Right groin stable.   Pain regimen via Dilaudid PCA . Antiemetics as needed. Hydrate. Advance diet as tolerated. Heating pad to pelvis as needed.  DC foley catheter on 6.10.24 Dr.Yamagata reviewed procedure notes/images with patient and family If pain well controlled tomorrow AM, stable for discharge from IR standpoint. F/U with Dr. Fredia Sorrow 1 month.  F/U pelvic MRI in 6 months.  IR to follow.   Consults: None  Significant Diagnostic Studies: IR EMBO TUMOR ORGAN ISCHEMIA INFARCT INC GUIDE ROADMAPPING  Result Date: 04/20/2023 CLINICAL DATA:  Symptomatic uterine fibroids with severe menorrhagia. EXAM: 1. ULTRASOUND GUIDANCE FOR VASCULAR ACCESS OF THE RIGHT COMMON FEMORAL ARTERY 2. SELECTIVE PELVIC ARTERIOGRAPHY OF THE LEFT INTERNAL ILIAC ARTERY 3. SELECTIVE ARTERIOGRAPHY OF THE LEFT UTERINE ARTERY 4. TRANSCATHETER EMBOLIZATION OF THE LEFT UTERINE ARTERY TO TREAT UTERINE FIBROIDS FOR ORGAN ISCHEMIA 5. SELECTIVE PELVIC ARTERIOGRAPHY OF THE RIGHT INTERNAL ILIAC ARTERY 6. SELECTIVE ARTERIOGRAPHY OF THE  RIGHT UTERINE ARTERY 7. TRANSCATHETER EMBOLIZATION OF THE RIGHT UTERINE ARTERY TO TREAT UTERINE FIBROIDS FOR ORGAN ISCHEMIA ANESTHESIA/SEDATION: Moderate (conscious) sedation was employed during this procedure. A total of Versed 4.0 mg and Fentanyl 200 mcg was administered intravenously by radiology nursing. Moderate Sedation Time: 127 minutes. The patient's level of consciousness and vital signs were monitored continuously by radiology nursing throughout the procedure under my direct supervision. MEDICATIONS: 2 g IV Ancef, 30 mg IV Toradol, 8 mg IV Decadron, 8 mg IV Zofran. FLUOROSCOPY: 49 minutes and 30 seconds.  628 mGy. CONTRAST:  90 mL Omnipaque 300 PROCEDURE: The procedure, risks, benefits, and alternatives were explained to the patient. Questions regarding the procedure were encouraged and answered. The patient understands and consents to the procedure. A time-out was performed prior to initiating the procedure. The right groin was prepped with chlorhexidine in a sterile fashion, and a sterile drape was applied covering the operative field. A sterile gown and sterile gloves were used for the procedure. Local anesthesia was provided with 1% Lidocaine. Ultrasound was used to confirm patency of the right common femoral artery. A permanent ultrasound image was saved and recorded. After small skin incision, a 21 gauge needle was advanced into the right common femoral artery under direct ultrasound guidance. Ultrasound image documentation was performed. After establishing guide wire access, a 5-French sheath was placed. A 5 Fr Cobra catheter was advanced over a guidewire into the distal abdominal aorta. The catheter was then used to selectively catheterize the left common iliac artery followed by the left internal iliac artery. Selective pelvic arteriography was performed of the left internal iliac artery. A 2.8 Fr coaxial microcatheter was then introduced through the diagnostic catheter and used to selectively  catheterize the left uterine artery over a guide wire utilizing road mapping technique. Selective arteriography of the  left uterine artery was performed through the microcatheter. Left uterine artery embolization was then performed with installation of microsphere particles. Follow-up arteriography was performed after embolization. The microcatheter was removed. The diagnostic catheter was then retracted and used to selectively catheterize the right internal iliac artery. Selective pelvic arteriography was performed of the right internal iliac artery. The microcatheter was then reintroduced and used to selectively catheterize the right uterine artery over a guidewire utilizing road mapping technique. Selective arteriography of the right uterine artery was then performed through the microcatheter. Right uterine artery embolization was then performed with installation of microsphere particles. Follow-up arteriography was performed after embolization. Right femoral arteriotomy hemostasis: Angio-Seal device COMPLICATIONS: None. FINDINGS: Bilateral uterine arteries were identified emanating from the proximal anterior divisions of both internal iliac arteries. Selective bilateral uterine arteriography demonstrates relatively hypervascular branches supplying the uterus. On both sides, there also may be some accessory partial branch supply to the ovaries. Left uterine artery embolization was performed utilizing 1 vial of 500-700 micron sized Embosphere particles. Completion arteriography demonstrates adequate occlusion of branches supplying uterine fibroids. Right uterine artery embolization was performed utilizing 2/3 of a vial of 500-700 micron sized Embosphere particles. Completion arteriography demonstrates adequate occlusion of branches supplying uterine fibroids. Adequate hemostasis was achieved at the femoral arteriotomy site. IMPRESSION: Successful bilateral uterine artery embolization to treat symptomatic uterine  fibroid disease. Adequate occlusion of branch vessels supplying uterine fibroids was achieved with microsphere particle embolization. The patient was admitted for overnight observation for treatment of post embolization symptoms. Electronically Signed   By: Irish Lack M.D.   On: 04/20/2023 15:26   IR US Guide Vasc Access Right  Result Date: 04/20/2023 CLINICAL DATA:  Symptomatic uterine fibroids with severe menorrhagia. EXAM: 1. ULTRASOUND GUIDANCE FOR VASCULAR ACCESS OF THE RIGHT COMMON FEMORAL ARTERY 2. SELECTIVE PELVIC ARTERIOGRAPHY OF THE LEFT INTERNAL ILIAC ARTERY 3. SELECTIVE ARTERIOGRAPHY OF THE LEFT UTERINE ARTERY 4. TRANSCATHETER EMBOLIZATION OF THE LEFT UTERINE ARTERY TO TREAT UTERINE FIBROIDS FOR ORGAN ISCHEMIA 5. SELECTIVE PELVIC ARTERIOGRAPHY OF THE RIGHT INTERNAL ILIAC ARTERY 6. SELECTIVE ARTERIOGRAPHY OF THE RIGHT UTERINE ARTERY 7. TRANSCATHETER EMBOLIZATION OF THE RIGHT UTERINE ARTERY TO TREAT UTERINE FIBROIDS FOR ORGAN ISCHEMIA ANESTHESIA/SEDATION: Moderate (conscious) sedation was employed during this procedure. A total of Versed 4.0 mg and Fentanyl 200 mcg was administered intravenously by radiology nursing. Moderate Sedation Time: 127 minutes. The patient's level of consciousness and vital signs were monitored continuously by radiology nursing throughout the procedure under my direct supervision. MEDICATIONS: 2 g IV Ancef, 30 mg IV Toradol, 8 mg IV Decadron, 8 mg IV Zofran. FLUOROSCOPY: 49 minutes and 30 seconds.  628 mGy. CONTRAST:  90 mL Omnipaque 300 PROCEDURE: The procedure, risks, benefits, and alternatives were explained to the patient. Questions regarding the procedure were encouraged and answered. The patient understands and consents to the procedure. A time-out was performed prior to initiating the procedure. The right groin was prepped with chlorhexidine in a sterile fashion, and a sterile drape was applied covering the operative field. A sterile gown and sterile gloves were  used for the procedure. Local anesthesia was provided with 1% Lidocaine. Ultrasound was used to confirm patency of the right common femoral artery. A permanent ultrasound image was saved and recorded. After small skin incision, a 21 gauge needle was advanced into the right common femoral artery under direct ultrasound guidance. Ultrasound image documentation was performed. After establishing guide wire access, a 5-French sheath was placed. A 5 Fr Cobra catheter was advanced over a guidewire  into the distal abdominal aorta. The catheter was then used to selectively catheterize the left common iliac artery followed by the left internal iliac artery. Selective pelvic arteriography was performed of the left internal iliac artery. A 2.8 Fr coaxial microcatheter was then introduced through the diagnostic catheter and used to selectively catheterize the left uterine artery over a guide wire utilizing road mapping technique. Selective arteriography of the left uterine artery was performed through the microcatheter. Left uterine artery embolization was then performed with installation of microsphere particles. Follow-up arteriography was performed after embolization. The microcatheter was removed. The diagnostic catheter was then retracted and used to selectively catheterize the right internal iliac artery. Selective pelvic arteriography was performed of the right internal iliac artery. The microcatheter was then reintroduced and used to selectively catheterize the right uterine artery over a guidewire utilizing road mapping technique. Selective arteriography of the right uterine artery was then performed through the microcatheter. Right uterine artery embolization was then performed with installation of microsphere particles. Follow-up arteriography was performed after embolization. Right femoral arteriotomy hemostasis: Angio-Seal device COMPLICATIONS: None. FINDINGS: Bilateral uterine arteries were identified emanating  from the proximal anterior divisions of both internal iliac arteries. Selective bilateral uterine arteriography demonstrates relatively hypervascular branches supplying the uterus. On both sides, there also may be some accessory partial branch supply to the ovaries. Left uterine artery embolization was performed utilizing 1 vial of 500-700 micron sized Embosphere particles. Completion arteriography demonstrates adequate occlusion of branches supplying uterine fibroids. Right uterine artery embolization was performed utilizing 2/3 of a vial of 500-700 micron sized Embosphere particles. Completion arteriography demonstrates adequate occlusion of branches supplying uterine fibroids. Adequate hemostasis was achieved at the femoral arteriotomy site. IMPRESSION: Successful bilateral uterine artery embolization to treat symptomatic uterine fibroid disease. Adequate occlusion of branch vessels supplying uterine fibroids was achieved with microsphere particle embolization. The patient was admitted for overnight observation for treatment of post embolization symptoms. Electronically Signed   By: Irish Lack M.D.   On: 04/20/2023 15:26   IR Angiogram Selective Each Additional Vessel  Result Date: 04/20/2023 CLINICAL DATA:  Symptomatic uterine fibroids with severe menorrhagia. EXAM: 1. ULTRASOUND GUIDANCE FOR VASCULAR ACCESS OF THE RIGHT COMMON FEMORAL ARTERY 2. SELECTIVE PELVIC ARTERIOGRAPHY OF THE LEFT INTERNAL ILIAC ARTERY 3. SELECTIVE ARTERIOGRAPHY OF THE LEFT UTERINE ARTERY 4. TRANSCATHETER EMBOLIZATION OF THE LEFT UTERINE ARTERY TO TREAT UTERINE FIBROIDS FOR ORGAN ISCHEMIA 5. SELECTIVE PELVIC ARTERIOGRAPHY OF THE RIGHT INTERNAL ILIAC ARTERY 6. SELECTIVE ARTERIOGRAPHY OF THE RIGHT UTERINE ARTERY 7. TRANSCATHETER EMBOLIZATION OF THE RIGHT UTERINE ARTERY TO TREAT UTERINE FIBROIDS FOR ORGAN ISCHEMIA ANESTHESIA/SEDATION: Moderate (conscious) sedation was employed during this procedure. A total of Versed 4.0 mg and  Fentanyl 200 mcg was administered intravenously by radiology nursing. Moderate Sedation Time: 127 minutes. The patient's level of consciousness and vital signs were monitored continuously by radiology nursing throughout the procedure under my direct supervision. MEDICATIONS: 2 g IV Ancef, 30 mg IV Toradol, 8 mg IV Decadron, 8 mg IV Zofran. FLUOROSCOPY: 49 minutes and 30 seconds.  628 mGy. CONTRAST:  90 mL Omnipaque 300 PROCEDURE: The procedure, risks, benefits, and alternatives were explained to the patient. Questions regarding the procedure were encouraged and answered. The patient understands and consents to the procedure. A time-out was performed prior to initiating the procedure. The right groin was prepped with chlorhexidine in a sterile fashion, and a sterile drape was applied covering the operative field. A sterile gown and sterile gloves were used for the procedure. Local anesthesia was  provided with 1% Lidocaine. Ultrasound was used to confirm patency of the right common femoral artery. A permanent ultrasound image was saved and recorded. After small skin incision, a 21 gauge needle was advanced into the right common femoral artery under direct ultrasound guidance. Ultrasound image documentation was performed. After establishing guide wire access, a 5-French sheath was placed. A 5 Fr Cobra catheter was advanced over a guidewire into the distal abdominal aorta. The catheter was then used to selectively catheterize the left common iliac artery followed by the left internal iliac artery. Selective pelvic arteriography was performed of the left internal iliac artery. A 2.8 Fr coaxial microcatheter was then introduced through the diagnostic catheter and used to selectively catheterize the left uterine artery over a guide wire utilizing road mapping technique. Selective arteriography of the left uterine artery was performed through the microcatheter. Left uterine artery embolization was then performed with  installation of microsphere particles. Follow-up arteriography was performed after embolization. The microcatheter was removed. The diagnostic catheter was then retracted and used to selectively catheterize the right internal iliac artery. Selective pelvic arteriography was performed of the right internal iliac artery. The microcatheter was then reintroduced and used to selectively catheterize the right uterine artery over a guidewire utilizing road mapping technique. Selective arteriography of the right uterine artery was then performed through the microcatheter. Right uterine artery embolization was then performed with installation of microsphere particles. Follow-up arteriography was performed after embolization. Right femoral arteriotomy hemostasis: Angio-Seal device COMPLICATIONS: None. FINDINGS: Bilateral uterine arteries were identified emanating from the proximal anterior divisions of both internal iliac arteries. Selective bilateral uterine arteriography demonstrates relatively hypervascular branches supplying the uterus. On both sides, there also may be some accessory partial branch supply to the ovaries. Left uterine artery embolization was performed utilizing 1 vial of 500-700 micron sized Embosphere particles. Completion arteriography demonstrates adequate occlusion of branches supplying uterine fibroids. Right uterine artery embolization was performed utilizing 2/3 of a vial of 500-700 micron sized Embosphere particles. Completion arteriography demonstrates adequate occlusion of branches supplying uterine fibroids. Adequate hemostasis was achieved at the femoral arteriotomy site. IMPRESSION: Successful bilateral uterine artery embolization to treat symptomatic uterine fibroid disease. Adequate occlusion of branch vessels supplying uterine fibroids was achieved with microsphere particle embolization. The patient was admitted for overnight observation for treatment of post embolization symptoms.  Electronically Signed   By: Irish Lack M.D.   On: 04/20/2023 15:26   IR Angiogram Selective Each Additional Vessel  Result Date: 04/20/2023 CLINICAL DATA:  Symptomatic uterine fibroids with severe menorrhagia. EXAM: 1. ULTRASOUND GUIDANCE FOR VASCULAR ACCESS OF THE RIGHT COMMON FEMORAL ARTERY 2. SELECTIVE PELVIC ARTERIOGRAPHY OF THE LEFT INTERNAL ILIAC ARTERY 3. SELECTIVE ARTERIOGRAPHY OF THE LEFT UTERINE ARTERY 4. TRANSCATHETER EMBOLIZATION OF THE LEFT UTERINE ARTERY TO TREAT UTERINE FIBROIDS FOR ORGAN ISCHEMIA 5. SELECTIVE PELVIC ARTERIOGRAPHY OF THE RIGHT INTERNAL ILIAC ARTERY 6. SELECTIVE ARTERIOGRAPHY OF THE RIGHT UTERINE ARTERY 7. TRANSCATHETER EMBOLIZATION OF THE RIGHT UTERINE ARTERY TO TREAT UTERINE FIBROIDS FOR ORGAN ISCHEMIA ANESTHESIA/SEDATION: Moderate (conscious) sedation was employed during this procedure. A total of Versed 4.0 mg and Fentanyl 200 mcg was administered intravenously by radiology nursing. Moderate Sedation Time: 127 minutes. The patient's level of consciousness and vital signs were monitored continuously by radiology nursing throughout the procedure under my direct supervision. MEDICATIONS: 2 g IV Ancef, 30 mg IV Toradol, 8 mg IV Decadron, 8 mg IV Zofran. FLUOROSCOPY: 49 minutes and 30 seconds.  628 mGy. CONTRAST:  90 mL Omnipaque 300 PROCEDURE: The procedure,  risks, benefits, and alternatives were explained to the patient. Questions regarding the procedure were encouraged and answered. The patient understands and consents to the procedure. A time-out was performed prior to initiating the procedure. The right groin was prepped with chlorhexidine in a sterile fashion, and a sterile drape was applied covering the operative field. A sterile gown and sterile gloves were used for the procedure. Local anesthesia was provided with 1% Lidocaine. Ultrasound was used to confirm patency of the right common femoral artery. A permanent ultrasound image was saved and recorded. After small  skin incision, a 21 gauge needle was advanced into the right common femoral artery under direct ultrasound guidance. Ultrasound image documentation was performed. After establishing guide wire access, a 5-French sheath was placed. A 5 Fr Cobra catheter was advanced over a guidewire into the distal abdominal aorta. The catheter was then used to selectively catheterize the left common iliac artery followed by the left internal iliac artery. Selective pelvic arteriography was performed of the left internal iliac artery. A 2.8 Fr coaxial microcatheter was then introduced through the diagnostic catheter and used to selectively catheterize the left uterine artery over a guide wire utilizing road mapping technique. Selective arteriography of the left uterine artery was performed through the microcatheter. Left uterine artery embolization was then performed with installation of microsphere particles. Follow-up arteriography was performed after embolization. The microcatheter was removed. The diagnostic catheter was then retracted and used to selectively catheterize the right internal iliac artery. Selective pelvic arteriography was performed of the right internal iliac artery. The microcatheter was then reintroduced and used to selectively catheterize the right uterine artery over a guidewire utilizing road mapping technique. Selective arteriography of the right uterine artery was then performed through the microcatheter. Right uterine artery embolization was then performed with installation of microsphere particles. Follow-up arteriography was performed after embolization. Right femoral arteriotomy hemostasis: Angio-Seal device COMPLICATIONS: None. FINDINGS: Bilateral uterine arteries were identified emanating from the proximal anterior divisions of both internal iliac arteries. Selective bilateral uterine arteriography demonstrates relatively hypervascular branches supplying the uterus. On both sides, there also may be  some accessory partial branch supply to the ovaries. Left uterine artery embolization was performed utilizing 1 vial of 500-700 micron sized Embosphere particles. Completion arteriography demonstrates adequate occlusion of branches supplying uterine fibroids. Right uterine artery embolization was performed utilizing 2/3 of a vial of 500-700 micron sized Embosphere particles. Completion arteriography demonstrates adequate occlusion of branches supplying uterine fibroids. Adequate hemostasis was achieved at the femoral arteriotomy site. IMPRESSION: Successful bilateral uterine artery embolization to treat symptomatic uterine fibroid disease. Adequate occlusion of branch vessels supplying uterine fibroids was achieved with microsphere particle embolization. The patient was admitted for overnight observation for treatment of post embolization symptoms. Electronically Signed   By: Irish Lack M.D.   On: 04/20/2023 15:26   IR Angiogram Pelvis Selective Or Supraselective  Result Date: 04/20/2023 CLINICAL DATA:  Symptomatic uterine fibroids with severe menorrhagia. EXAM: 1. ULTRASOUND GUIDANCE FOR VASCULAR ACCESS OF THE RIGHT COMMON FEMORAL ARTERY 2. SELECTIVE PELVIC ARTERIOGRAPHY OF THE LEFT INTERNAL ILIAC ARTERY 3. SELECTIVE ARTERIOGRAPHY OF THE LEFT UTERINE ARTERY 4. TRANSCATHETER EMBOLIZATION OF THE LEFT UTERINE ARTERY TO TREAT UTERINE FIBROIDS FOR ORGAN ISCHEMIA 5. SELECTIVE PELVIC ARTERIOGRAPHY OF THE RIGHT INTERNAL ILIAC ARTERY 6. SELECTIVE ARTERIOGRAPHY OF THE RIGHT UTERINE ARTERY 7. TRANSCATHETER EMBOLIZATION OF THE RIGHT UTERINE ARTERY TO TREAT UTERINE FIBROIDS FOR ORGAN ISCHEMIA ANESTHESIA/SEDATION: Moderate (conscious) sedation was employed during this procedure. A total of Versed 4.0 mg and Fentanyl  200 mcg was administered intravenously by radiology nursing. Moderate Sedation Time: 127 minutes. The patient's level of consciousness and vital signs were monitored continuously by radiology nursing  throughout the procedure under my direct supervision. MEDICATIONS: 2 g IV Ancef, 30 mg IV Toradol, 8 mg IV Decadron, 8 mg IV Zofran. FLUOROSCOPY: 49 minutes and 30 seconds.  628 mGy. CONTRAST:  90 mL Omnipaque 300 PROCEDURE: The procedure, risks, benefits, and alternatives were explained to the patient. Questions regarding the procedure were encouraged and answered. The patient understands and consents to the procedure. A time-out was performed prior to initiating the procedure. The right groin was prepped with chlorhexidine in a sterile fashion, and a sterile drape was applied covering the operative field. A sterile gown and sterile gloves were used for the procedure. Local anesthesia was provided with 1% Lidocaine. Ultrasound was used to confirm patency of the right common femoral artery. A permanent ultrasound image was saved and recorded. After small skin incision, a 21 gauge needle was advanced into the right common femoral artery under direct ultrasound guidance. Ultrasound image documentation was performed. After establishing guide wire access, a 5-French sheath was placed. A 5 Fr Cobra catheter was advanced over a guidewire into the distal abdominal aorta. The catheter was then used to selectively catheterize the left common iliac artery followed by the left internal iliac artery. Selective pelvic arteriography was performed of the left internal iliac artery. A 2.8 Fr coaxial microcatheter was then introduced through the diagnostic catheter and used to selectively catheterize the left uterine artery over a guide wire utilizing road mapping technique. Selective arteriography of the left uterine artery was performed through the microcatheter. Left uterine artery embolization was then performed with installation of microsphere particles. Follow-up arteriography was performed after embolization. The microcatheter was removed. The diagnostic catheter was then retracted and used to selectively catheterize the  right internal iliac artery. Selective pelvic arteriography was performed of the right internal iliac artery. The microcatheter was then reintroduced and used to selectively catheterize the right uterine artery over a guidewire utilizing road mapping technique. Selective arteriography of the right uterine artery was then performed through the microcatheter. Right uterine artery embolization was then performed with installation of microsphere particles. Follow-up arteriography was performed after embolization. Right femoral arteriotomy hemostasis: Angio-Seal device COMPLICATIONS: None. FINDINGS: Bilateral uterine arteries were identified emanating from the proximal anterior divisions of both internal iliac arteries. Selective bilateral uterine arteriography demonstrates relatively hypervascular branches supplying the uterus. On both sides, there also may be some accessory partial branch supply to the ovaries. Left uterine artery embolization was performed utilizing 1 vial of 500-700 micron sized Embosphere particles. Completion arteriography demonstrates adequate occlusion of branches supplying uterine fibroids. Right uterine artery embolization was performed utilizing 2/3 of a vial of 500-700 micron sized Embosphere particles. Completion arteriography demonstrates adequate occlusion of branches supplying uterine fibroids. Adequate hemostasis was achieved at the femoral arteriotomy site. IMPRESSION: Successful bilateral uterine artery embolization to treat symptomatic uterine fibroid disease. Adequate occlusion of branch vessels supplying uterine fibroids was achieved with microsphere particle embolization. The patient was admitted for overnight observation for treatment of post embolization symptoms. Electronically Signed   By: Irish Lack M.D.   On: 04/20/2023 15:26    Treatments: Bilateral uterine arteries identified with selective pelvic internal iliac arteriography bilaterally.   Discharge Exam: Blood  pressure 112/66, pulse 67, temperature 98.7 F (37.1 C), temperature source Oral, resp. rate 13, height 5\' 8"  (1.727 m), weight 177 lb 9.6 oz (80.6 kg),  SpO2 95 %.  Physical Exam Vitals and nursing note reviewed.  Constitutional:      Appearance: Normal appearance. She is well-developed.  HENT:     Head: Normocephalic and atraumatic.  Eyes:     Conjunctiva/sclera: Conjunctivae normal.  Cardiovascular:     Comments: Right groin  access site is soft with no active bleeding and no appreciable pseudoaneurysm. Dressing is C/D/I   Pulmonary:     Effort: Pulmonary effort is normal.  Musculoskeletal:        General: Normal range of motion.     Cervical back: Normal range of motion.  Skin:    General: Skin is warm and dry.  Neurological:     General: No focal deficit present.     Mental Status: She is alert and oriented to person, place, and time.  Psychiatric:        Mood and Affect: Mood normal.        Behavior: Behavior normal.        Thought Content: Thought content normal.        Judgment: Judgment normal.     Disposition:    Allergies as of 04/21/2023       Reactions   Sulfamethoxazole-trimethoprim Nausea Only   Wound Dressing Adhesive Itching, Rash        Medication List     TAKE these medications    ALPRAZolam 0.25 MG tablet Commonly known as: XANAX Take 0.25 mg by mouth.   fluconazole 150 MG tablet Commonly known as: DIFLUCAN Take 150 mg by mouth every 3 (three) days.   hydrOXYzine 10 MG tablet Commonly known as: ATARAX Take 1 tablet (10 mg total) by mouth 3 (three) times daily as needed.   ipratropium 0.03 % nasal spray Commonly known as: ATROVENT Place 2 sprays into both nostrils 2 (two) times daily.   lidocaine-prilocaine cream Commonly known as: EMLA Apply to affected area once   prochlorperazine 10 MG tablet Commonly known as: COMPAZINE Take 1 tablet (10 mg total) by mouth every 6 (six) hours as needed for nausea or vomiting.   tranexamic  acid 650 MG Tabs tablet Commonly known as: LYSTEDA Take 2 tablets (1,300 mg total) by mouth 3 (three) times daily.   Veozah 45 MG Tabs Generic drug: Fezolinetant TAKE 1 TABLET BY MOUTH EVERY DAY        Follow-up Information     [facility] Follow up.   Why: IR scheduler will call with appoinment date and time. Please call [phone] with any questions or concerns Contact information: [address] [place] [address] [phone]                  Electronically Signed: Alene Mires, NP 04/21/2023, 8:43 AM   I have spent Less Than 30 Minutes discharging [name].

## 2023-04-22 ENCOUNTER — Inpatient Hospital Stay: Payer: 59

## 2023-04-22 ENCOUNTER — Inpatient Hospital Stay (HOSPITAL_BASED_OUTPATIENT_CLINIC_OR_DEPARTMENT_OTHER): Payer: 59 | Admitting: Physician Assistant

## 2023-04-22 ENCOUNTER — Inpatient Hospital Stay: Payer: 59 | Attending: Hematology and Oncology

## 2023-04-22 DIAGNOSIS — Z95828 Presence of other vascular implants and grafts: Secondary | ICD-10-CM

## 2023-04-22 DIAGNOSIS — Z803 Family history of malignant neoplasm of breast: Secondary | ICD-10-CM | POA: Insufficient documentation

## 2023-04-22 DIAGNOSIS — Z170 Estrogen receptor positive status [ER+]: Secondary | ICD-10-CM | POA: Diagnosis not present

## 2023-04-22 DIAGNOSIS — C50012 Malignant neoplasm of nipple and areola, left female breast: Secondary | ICD-10-CM

## 2023-04-22 DIAGNOSIS — D696 Thrombocytopenia, unspecified: Secondary | ICD-10-CM | POA: Diagnosis not present

## 2023-04-22 DIAGNOSIS — R5383 Other fatigue: Secondary | ICD-10-CM | POA: Diagnosis not present

## 2023-04-22 DIAGNOSIS — G629 Polyneuropathy, unspecified: Secondary | ICD-10-CM | POA: Insufficient documentation

## 2023-04-22 DIAGNOSIS — R110 Nausea: Secondary | ICD-10-CM | POA: Insufficient documentation

## 2023-04-22 DIAGNOSIS — N920 Excessive and frequent menstruation with regular cycle: Secondary | ICD-10-CM | POA: Diagnosis not present

## 2023-04-22 DIAGNOSIS — Z5111 Encounter for antineoplastic chemotherapy: Secondary | ICD-10-CM | POA: Diagnosis present

## 2023-04-22 DIAGNOSIS — Z9012 Acquired absence of left breast and nipple: Secondary | ICD-10-CM | POA: Insufficient documentation

## 2023-04-22 LAB — CBC WITH DIFFERENTIAL (CANCER CENTER ONLY)
Abs Immature Granulocytes: 0.03 10*3/uL (ref 0.00–0.07)
Basophils Absolute: 0 10*3/uL (ref 0.0–0.1)
Basophils Relative: 0 %
Eosinophils Absolute: 0.1 10*3/uL (ref 0.0–0.5)
Eosinophils Relative: 1 %
HCT: 35.8 % — ABNORMAL LOW (ref 36.0–46.0)
Hemoglobin: 12.3 g/dL (ref 12.0–15.0)
Immature Granulocytes: 0 %
Lymphocytes Relative: 33 %
Lymphs Abs: 3.7 10*3/uL (ref 0.7–4.0)
MCH: 33.5 pg (ref 26.0–34.0)
MCHC: 34.4 g/dL (ref 30.0–36.0)
MCV: 97.5 fL (ref 80.0–100.0)
Monocytes Absolute: 0.8 10*3/uL (ref 0.1–1.0)
Monocytes Relative: 7 %
Neutro Abs: 6.7 10*3/uL (ref 1.7–7.7)
Neutrophils Relative %: 59 %
Platelet Count: 104 10*3/uL — ABNORMAL LOW (ref 150–400)
RBC: 3.67 MIL/uL — ABNORMAL LOW (ref 3.87–5.11)
RDW: 14.6 % (ref 11.5–15.5)
WBC Count: 11.4 10*3/uL — ABNORMAL HIGH (ref 4.0–10.5)
nRBC: 0 % (ref 0.0–0.2)

## 2023-04-22 LAB — CMP (CANCER CENTER ONLY)
ALT: 44 U/L (ref 0–44)
AST: 57 U/L — ABNORMAL HIGH (ref 15–41)
Albumin: 3.7 g/dL (ref 3.5–5.0)
Alkaline Phosphatase: 62 U/L (ref 38–126)
Anion gap: 7 (ref 5–15)
BUN: 19 mg/dL (ref 6–20)
CO2: 29 mmol/L (ref 22–32)
Calcium: 8.9 mg/dL (ref 8.9–10.3)
Chloride: 105 mmol/L (ref 98–111)
Creatinine: 0.86 mg/dL (ref 0.44–1.00)
GFR, Estimated: >60 mL/min (ref >60)
Glucose, Bld: 103 mg/dL — ABNORMAL HIGH (ref 70–99)
Potassium: 3.4 mmol/L — ABNORMAL LOW (ref 3.5–5.1)
Sodium: 141 mmol/L (ref 135–145)
Total Bilirubin: 1 mg/dL (ref 0.3–1.2)
Total Protein: 7.2 g/dL (ref 6.5–8.1)

## 2023-04-22 MED ORDER — SODIUM CHLORIDE 0.9% FLUSH
10.0000 mL | INTRAVENOUS | Status: DC | PRN
  Administered 2023-04-22: 10 mL

## 2023-04-22 MED ORDER — ONDANSETRON HCL 8 MG PO TABS
8.0000 mg | ORAL_TABLET | Freq: Once | ORAL | Status: AC
  Administered 2023-04-22: 8 mg via ORAL
  Filled 2023-04-22: qty 1

## 2023-04-22 MED ORDER — SODIUM CHLORIDE 0.9% FLUSH
10.0000 mL | Freq: Once | INTRAVENOUS | Status: AC
  Administered 2023-04-22: 10 mL

## 2023-04-22 MED ORDER — CETIRIZINE HCL 10 MG PO TABS
10.0000 mg | ORAL_TABLET | Freq: Once | ORAL | Status: AC
  Administered 2023-04-22: 10 mg via ORAL
  Filled 2023-04-22: qty 1

## 2023-04-22 MED ORDER — HEPARIN SOD (PORK) LOCK FLUSH 100 UNIT/ML IV SOLN
500.0000 [IU] | Freq: Once | INTRAVENOUS | Status: AC | PRN
  Administered 2023-04-22: 500 [IU]

## 2023-04-22 MED ORDER — SODIUM CHLORIDE 0.9 % IV SOLN: Freq: Once | INTRAVENOUS | Status: AC

## 2023-04-22 MED ORDER — ACETAMINOPHEN 325 MG PO TABS
650.0000 mg | ORAL_TABLET | Freq: Once | ORAL | Status: AC
  Administered 2023-04-22: 650 mg via ORAL
  Filled 2023-04-22: qty 2

## 2023-04-22 MED ORDER — SODIUM CHLORIDE 0.9 % IV SOLN
3.0000 mg/kg | Freq: Once | INTRAVENOUS | Status: AC
  Administered 2023-04-22: 200 mg via INTRAVENOUS
  Filled 2023-04-22: qty 10

## 2023-04-22 NOTE — Patient Instructions (Signed)
[facility]  Discharge Instructions: Thank you for choosing [facility] to provide your oncology and hematology care.   If you have a lab appointment with the Cancer Center, please go directly to the Cancer Center and check in at the registration area.   Wear comfortable clothing and clothing appropriate for easy access to any Portacath or PICC line.   We strive to give you quality time with your provider. You may need to reschedule your appointment if you arrive late (15 or more minutes).  Arriving late affects you and other patients whose appointments are after yours.  Also, if you miss three or more appointments without notifying the office, you may be dismissed from the clinic at the provider's discretion.      For prescription refill requests, have your pharmacy contact our office and allow 72 hours for refills to be completed.    Today you received the following chemotherapy and/or immunotherapy agents: Kadcyla.       To help prevent nausea and vomiting after your treatment, we encourage you to take your nausea medication as directed.  BELOW ARE SYMPTOMS THAT SHOULD BE REPORTED IMMEDIATELY: *FEVER GREATER THAN 100.4 F (38 C) OR HIGHER *CHILLS OR SWEATING *NAUSEA AND VOMITING THAT IS NOT CONTROLLED WITH YOUR NAUSEA MEDICATION *UNUSUAL SHORTNESS OF BREATH *UNUSUAL BRUISING OR BLEEDING *URINARY PROBLEMS (pain or burning when urinating, or frequent urination) *BOWEL PROBLEMS (unusual diarrhea, constipation, pain near the anus) TENDERNESS IN MOUTH AND THROAT WITH OR WITHOUT PRESENCE OF ULCERS (sore throat, sores in mouth, or a toothache) UNUSUAL RASH, SWELLING OR PAIN  UNUSUAL VAGINAL DISCHARGE OR ITCHING   Items with * indicate a potential emergency and should be followed up as soon as possible or go to the Emergency Department if any problems should occur.  Please show the CHEMOTHERAPY ALERT CARD or IMMUNOTHERAPY ALERT CARD at  check-in to the Emergency Department and triage nurse.  Should you have questions after your visit or need to cancel or reschedule your appointment, please contact  [facility]  Dept: [phone]  and follow the prompts.  Office hours are 8:00 a.m. to 4:30 p.m. Monday - Friday. Please note that voicemails left after 4:00 p.m. may not be returned until the following business day.  We are closed weekends and major holidays. You have access to a nurse at all times for urgent questions. Please call the main number to the clinic Dept: [phone] and follow the prompts.   For any non-urgent questions, you may also contact your provider using MyChart. We now offer e-Visits for anyone 18 and older to request care online for non-urgent symptoms. For details visit [email or web address].   Also download the MyChart app! Go to the app store, search "MyChart", open the app, select , and log in with your MyChart username and password.   

## 2023-04-22 NOTE — Progress Notes (Signed)
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 arteries were identified emanating from the proximal anterior divisions of both internal iliac arteries. Selective bilateral uterine arteriography demonstrates relatively hypervascular branches supplying the uterus. On both sides, there also may be some accessory partial branch supply to the ovaries. Left uterine artery embolization was performed utilizing 1 vial of 500-700 micron sized Embosphere particles. Completion arteriography demonstrates adequate occlusion of branches supplying uterine fibroids. Right uterine artery embolization was performed utilizing 2/3 of a vial of 500-700 micron sized Embosphere particles. Completion arteriography demonstrates adequate occlusion of branches supplying uterine fibroids. Adequate hemostasis was achieved at the femoral arteriotomy site. IMPRESSION: Successful bilateral uterine artery embolization to treat symptomatic uterine fibroid disease. Adequate occlusion of branch vessels supplying uterine fibroids was achieved with microsphere particle embolization. The patient was  admitted for overnight observation for treatment of post embolization symptoms. Electronically Signed   By: Irish Lack M.D.   On: 04/20/2023 15:26   IR Angiogram Selective Each Additional Vessel  Result Date: 04/20/2023 CLINICAL DATA:  Symptomatic uterine fibroids with severe menorrhagia. EXAM: 1. ULTRASOUND GUIDANCE FOR VASCULAR ACCESS OF THE RIGHT COMMON FEMORAL ARTERY 2. SELECTIVE PELVIC ARTERIOGRAPHY OF THE LEFT INTERNAL ILIAC ARTERY 3. SELECTIVE ARTERIOGRAPHY OF THE LEFT  UTERINE ARTERY 4. TRANSCATHETER EMBOLIZATION OF THE LEFT UTERINE ARTERY TO TREAT UTERINE FIBROIDS FOR ORGAN ISCHEMIA 5. SELECTIVE PELVIC ARTERIOGRAPHY OF THE RIGHT INTERNAL ILIAC ARTERY 6. SELECTIVE ARTERIOGRAPHY OF THE RIGHT UTERINE ARTERY 7. TRANSCATHETER EMBOLIZATION OF THE RIGHT UTERINE ARTERY TO TREAT UTERINE FIBROIDS FOR ORGAN ISCHEMIA ANESTHESIA/SEDATION: Moderate (conscious) sedation was employed during this procedure. A total of Versed 4.0 mg and Fentanyl 200 mcg was administered intravenously by radiology nursing. Moderate Sedation Time: 127 minutes. The patient's level of consciousness and vital signs were monitored continuously by radiology nursing throughout the procedure under my direct supervision. MEDICATIONS: 2 g IV Ancef, 30 mg IV Toradol, 8 mg IV Decadron, 8 mg IV Zofran. FLUOROSCOPY: 49 minutes and 30 seconds.  628 mGy. CONTRAST:  90 mL Omnipaque 300 PROCEDURE: The procedure, risks, benefits, and alternatives were explained to the patient. Questions regarding the procedure were encouraged and answered. The patient understands and consents to the procedure. A time-out was performed prior to initiating the procedure. The right groin was prepped with chlorhexidine in a sterile fashion, and a sterile drape was applied covering the operative field. A sterile gown and sterile gloves were used for the procedure. Local anesthesia was provided with 1% Lidocaine. Ultrasound was used to confirm patency of the right common  femoral artery. A permanent ultrasound image was saved and recorded. After small skin incision, a 21 gauge needle was advanced into the right common femoral artery under direct ultrasound guidance. Ultrasound image documentation was performed. After establishing guide wire access, a 5-French sheath was placed. A 5 Fr Cobra catheter was advanced over a guidewire into the distal abdominal aorta. The catheter was then used to selectively catheterize the left common iliac artery followed by the left internal iliac artery. Selective pelvic arteriography was performed of the left internal iliac artery. A 2.8 Fr coaxial microcatheter was then introduced through the diagnostic catheter and used to selectively catheterize the left uterine artery over a guide wire utilizing road mapping technique. Selective arteriography of the left uterine artery was performed through the microcatheter. Left uterine artery embolization was then performed with installation of microsphere particles. Follow-up arteriography was performed after embolization. The microcatheter was removed. The diagnostic catheter was then retracted and used to selectively catheterize the right internal iliac artery. Selective pelvic arteriography was performed of the right internal iliac artery. The microcatheter was then reintroduced and used to selectively catheterize the right uterine artery over a guidewire utilizing road mapping technique. Selective arteriography of the right uterine artery was then performed through the microcatheter. Right uterine artery embolization was then performed with installation of microsphere particles. Follow-up arteriography was performed after embolization. Right femoral arteriotomy hemostasis: Angio-Seal device COMPLICATIONS: None. FINDINGS: Bilateral uterine arteries were identified emanating from the proximal anterior divisions of both internal iliac arteries. Selective bilateral uterine arteriography demonstrates relatively  hypervascular branches supplying the uterus. On both sides, there also may be some accessory partial branch supply to the ovaries. Left uterine artery embolization was performed utilizing 1 vial of 500-700 micron sized Embosphere particles. Completion arteriography demonstrates adequate occlusion of branches supplying uterine fibroids. Right uterine artery embolization was performed utilizing 2/3 of a vial of 500-700 micron sized Embosphere particles. Completion arteriography demonstrates adequate occlusion of branches supplying uterine fibroids. Adequate hemostasis was achieved at the femoral arteriotomy site. IMPRESSION: Successful bilateral uterine artery embolization to treat symptomatic uterine fibroid disease. Adequate occlusion of branch vessels supplying uterine fibroids was achieved with microsphere particle embolization. The patient was admitted for overnight observation for  treatment of post embolization symptoms. Electronically Signed   By: Irish Lack M.D.   On: 04/20/2023 15:26   IR Angiogram Pelvis Selective Or Supraselective  Result Date: 04/20/2023 CLINICAL DATA:  Symptomatic uterine fibroids with severe menorrhagia. EXAM: 1. ULTRASOUND GUIDANCE FOR VASCULAR ACCESS OF THE RIGHT COMMON FEMORAL ARTERY 2. SELECTIVE PELVIC ARTERIOGRAPHY OF THE LEFT INTERNAL ILIAC ARTERY 3. SELECTIVE ARTERIOGRAPHY OF THE LEFT UTERINE ARTERY 4. TRANSCATHETER EMBOLIZATION OF THE LEFT UTERINE ARTERY TO TREAT UTERINE FIBROIDS FOR ORGAN ISCHEMIA 5. SELECTIVE PELVIC ARTERIOGRAPHY OF THE RIGHT INTERNAL ILIAC ARTERY 6. SELECTIVE ARTERIOGRAPHY OF THE RIGHT UTERINE ARTERY 7. TRANSCATHETER EMBOLIZATION OF THE RIGHT UTERINE ARTERY TO TREAT UTERINE FIBROIDS FOR ORGAN ISCHEMIA ANESTHESIA/SEDATION: Moderate (conscious) sedation was employed during this procedure. A total of Versed 4.0 mg and Fentanyl 200 mcg was administered intravenously by radiology nursing. Moderate Sedation Time: 127 minutes. The patient's level of  consciousness and vital signs were monitored continuously by radiology nursing throughout the procedure under my direct supervision. MEDICATIONS: 2 g IV Ancef, 30 mg IV Toradol, 8 mg IV Decadron, 8 mg IV Zofran. FLUOROSCOPY: 49 minutes and 30 seconds.  628 mGy. CONTRAST:  90 mL Omnipaque 300 PROCEDURE: The procedure, risks, benefits, and alternatives were explained to the patient. Questions regarding the procedure were encouraged and answered. The patient understands and consents to the procedure. A time-out was performed prior to initiating the procedure. The right groin was prepped with chlorhexidine in a sterile fashion, and a sterile drape was applied covering the operative field. A sterile gown and sterile gloves were used for the procedure. Local anesthesia was provided with 1% Lidocaine. Ultrasound was used to confirm patency of the right common femoral artery. A permanent ultrasound image was saved and recorded. After small skin incision, a 21 gauge needle was advanced into the right common femoral artery under direct ultrasound guidance. Ultrasound image documentation was performed. After establishing guide wire access, a 5-French sheath was placed. A 5 Fr Cobra catheter was advanced over a guidewire into the distal abdominal aorta. The catheter was then used to selectively catheterize the left common iliac artery followed by the left internal iliac artery. Selective pelvic arteriography was performed of the left internal iliac artery. A 2.8 Fr coaxial microcatheter was then introduced through the diagnostic catheter and used to selectively catheterize the left uterine artery over a guide wire utilizing road mapping technique. Selective arteriography of the left uterine artery was performed through the microcatheter. Left uterine artery embolization was then performed with installation of microsphere particles. Follow-up arteriography was performed after embolization. The microcatheter was removed. The  diagnostic catheter was then retracted and used to selectively catheterize the right internal iliac artery. Selective pelvic arteriography was performed of the right internal iliac artery. The microcatheter was then reintroduced and used to selectively catheterize the right uterine artery over a guidewire utilizing road mapping technique. Selective arteriography of the right uterine artery was then performed through the microcatheter. Right uterine artery embolization was then performed with installation of microsphere particles. Follow-up arteriography was performed after embolization. Right femoral arteriotomy hemostasis: Angio-Seal device COMPLICATIONS: None. FINDINGS: Bilateral uterine arteries were identified emanating from the proximal anterior divisions of both internal iliac arteries. Selective bilateral uterine arteriography demonstrates relatively hypervascular branches supplying the uterus. On both sides, there also may be some accessory partial branch supply to the ovaries. Left uterine artery embolization was performed utilizing 1 vial of 500-700 micron sized Embosphere particles. Completion arteriography demonstrates adequate occlusion of branches supplying uterine fibroids. Right  uterine artery embolization was performed utilizing 2/3 of a vial of 500-700 micron sized Embosphere particles. Completion arteriography demonstrates adequate occlusion of branches supplying uterine fibroids. Adequate hemostasis was achieved at the femoral arteriotomy site. IMPRESSION: Successful bilateral uterine artery embolization to treat symptomatic uterine fibroid disease. Adequate occlusion of branch vessels supplying uterine fibroids was achieved with microsphere particle embolization. The patient was admitted for overnight observation for treatment of post embolization symptoms. Electronically Signed   By: Irish Lack M.D.   On: 04/20/2023 15:26    ASSESSMENT & PLAN [name] is a 42 y.o. female who  presents to the clinic for follow up for left breast cancer.   #Left breast cancer: --03/04/2022 History of subpectoral implants, palpable left breast mass by ultrasound 1.4 cm.  Multiple additional masses 1.1 and 0.9 cm.   --Ultrasound biopsy revealed grade 2 IDC ER 95%, PR 100%, HER2 positive, Ki-67 10%.   --Breast MRI showed at least 7 different masses largest 1.6 cm.  No lymphadenopathy  Treatment plan: 1. Neoadjuvant chemotherapy with TCH  6 cycles completed 07/01/2022 followed byKadcyla maintenance started 09/02/2022 2. left mastectomy with sentinel lymph node study: 08/13/2022 ([facility]): Multifocal grade 2 IDC 8 mm, 5 mm, 6 mm, 0/4 lymph nodes, ER/PR positive HER2 positive, reconstruction 08/21/2022 3. Followed by adjuvant antiestrogen therapy PLAN: --Due for Cycle 12 of Kadcyla today --Labs from today were reviewed and adequate for treatment. WBC 11.4, Hgb 12.3, Plt 104, Creatinine normal. AST stable at 57.  --Proceed with treatment today without any dose modifications. Patient requested to change compazine to zofran due to sedation with compazine.  --RTC in 3 weeks with labs, follow up visit before Cycle 13.   #Thrombocytopenia: --Overall stable and above 100K --Consider dose reduction if plts drop below 100K.   #Fatigue/Nausea: --Secondary to Kadcyla. Symptoms improved after dose reduction with cycle 7.   #Peripheral neuropathy: --Mild and does not interfere with grip/balance. Monitor for now.   #Heavy menstrual cycles: --Underwent uterine fibroid emobolization on 04/20/2023 --Need to monitor if bleeding improves with above procedure.   #Tamoxifen toxicities: --Symptoms included severe joint stiffness in shoulders and swelling in hands. Patient is current holding Tamoxifen. --Consider resuming tamoxifen after Kadcyla is complete but at a lower dose.   No orders of the defined types were placed in this encounter.   All questions were answered. The patient knows to call the  clinic with any problems, questions or concerns.  I have spent a total of 30 minutes minutes of face-to-face and non-face-to-face time, preparing to see the patient, performing a medically appropriate examination, counseling and educating the patient, documenting clinical information in the electronic health record, and care coordination.   Georga Kaufmann, PA-C Department of Hematology/Oncology [facility] at [facility] Phone: [phone]

## 2023-05-09 NOTE — Progress Notes (Signed)
Patient Care Team: Jerene Bears, MD as PCP - General (Obstetrics and Gynecology) Maisie Fus, MD as PCP - Cardiology (Cardiology) Pershing Proud, RN as Oncology Nurse Navigator Donnelly Angelica, RN as Oncology Nurse Navigator Serena Croissant, MD as Consulting Physician (Hematology and Oncology) Emelia Loron, MD as Consulting Physician (General Surgery)  DIAGNOSIS: No diagnosis found.  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  Multiple additional masses 1.1 and 0.9 cm.  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  Breast MRI showed at least 7 different masses largest 1.6 cm.  No lymphadenopathy   03/04/2022 Cancer Staging   Staging form: Breast, AJCC 8th Edition - Clinical stage from 03/04/2022: Stage IA (cT1c, cN0, cM0, G2, ER+, PR+, HER2+) - Signed by Loa Socks, NP on 11/13/2022 Stage prefix: Initial diagnosis Histologic grading system: 3 grade system   03/17/2022 Genetic Testing   Negative genetic testing through the CancerNext performed at [facility] office.  The report date is Mar 17, 2022.  The CancerNext gene panel offered by [employer] includes sequencing and rearrangement analysis for the following 34 genes:   APC, ATM, BARD1, BMPR1A, BRCA1, BRCA2, BRIP1, CDH1, CDK4, CDKN2A, CHEK2, DICER1, HOXB13, EPCAM, GREM1, MLH1, MRE11A, MSH2, MSH6, MUTYH, NBN, NF1, PALB2, PMS2, POLD1, POLE, PTEN, RAD50, RAD51C, RAD51D, SMAD4, SMARCA4, STK11, and TP53.     03/18/2022 - 08/12/2022 Chemotherapy   Patient is on Treatment Plan : BREAST Docetaxel + Carboplatin + Trastuzumab (TCH) q21d / Trastuzumab q21d     08/13/2022 Surgery   Left mastectomy with sentinel lymph node study: 08/13/2022 ([facility]):  Multifocal grade 2 IDC 8 mm, 5 mm, 6 mm, 0/4 lymph nodes, ER/PR positive HER2 positive, reconstruction 08/21/2022    08/13/2022 Cancer Staging   Staging form: Breast, AJCC 8th Edition - Pathologic stage from 08/13/2022: No Stage Recommended (ypT1b, pN0, cM0, G2, ER+, PR+, HER2+) - Signed by Loa Socks, NP on 11/13/2022 Stage prefix: Post-therapy Histologic grading system: 3 grade system   09/02/2022 -  Chemotherapy   Patient is on Treatment Plan : BREAST ADO-Trastuzumab Emtansine (Kadcyla) q21d     10/2022 -  Anti-estrogen oral therapy   Tamoxifen daily     CHIEF COMPLIANT:  Follow-up Kadcyla cycle 12  INTERVAL HISTORY: [name] is a 42 y.o with left breast estrogen receptor positive Currently on antiestrogen therapy with tamoxifen and treatment with Kadcyla. She presents to the clinic for a follow-up.    ALLERGIES:  is allergic to sulfamethoxazole-trimethoprim and wound dressing adhesive.  MEDICATIONS:  Current Outpatient Medications  Medication Sig Dispense Refill   ALPRAZolam (XANAX) 0.25 MG tablet Take 0.25 mg by mouth.     fluconazole (DIFLUCAN) 150 MG tablet Take 150 mg by mouth every 3 (three) days.     HYDROcodone-acetaminophen (NORCO/VICODIN) 5-325 MG tablet Take 1 tablet by mouth every 4 (four) hours as needed for up to 30 doses for moderate pain. 30 tablet 0   hydrOXYzine (ATARAX) 10 MG tablet Take 1 tablet (10 mg total) by mouth 3 (three) times daily as needed. 30 tablet 0   ipratropium (ATROVENT) 0.03 % nasal spray Place 2 sprays into both nostrils 2 (two) times daily.     lidocaine-prilocaine (EMLA) cream Apply to affected area once 30  g 3   prochlorperazine (COMPAZINE) 10 MG tablet Take 1 tablet (10 mg total) by mouth every 6 (six) hours as needed for nausea or vomiting. 30 tablet 3   promethazine (PHENERGAN) 25 MG tablet Take 1 tablet (25 mg total) by mouth every 8 (eight) hours as needed for up to 20 days for nausea or vomiting. 30 tablet 0    tranexamic acid (LYSTEDA) 650 MG TABS tablet Take 2 tablets (1,300 mg total) by mouth 3 (three) times daily. 30 tablet 1   VEOZAH 45 MG TABS TAKE 1 TABLET BY MOUTH EVERY DAY 30 tablet 2   No current facility-administered medications for this visit.    PHYSICAL EXAMINATION: ECOG PERFORMANCE STATUS: {CHL ONC ECOG PS:[phone]}  There were no vitals filed for this visit. There were no vitals filed for this visit.  BREAST:*** No palpable masses or nodules in either right or left breasts. No palpable axillary supraclavicular or infraclavicular adenopathy no breast tenderness or nipple discharge. (exam performed in the presence of a chaperone)  LABORATORY DATA:  I have reviewed the data as listed    Latest Ref Rng & Units 04/22/2023    1:26 PM 04/20/2023    8:00 AM 03/31/2023    8:12 AM  CMP  Glucose 70 - 99 mg/dL 295  621  308   BUN 6 - 20 mg/dL 19  13  17    Creatinine 0.44 - 1.00 mg/dL 6.57  8.46  9.62   Sodium 135 - 145 mmol/L 141  136  136   Potassium 3.5 - 5.1 mmol/L 3.4  3.5  3.5   Chloride 98 - 111 mmol/L 105  103  105   CO2 22 - 32 mmol/L 29  23  25    Calcium 8.9 - 10.3 mg/dL 8.9  9.0  9.0   Total Protein 6.5 - 8.1 g/dL 7.2   7.0   Total Bilirubin 0.3 - 1.2 mg/dL 1.0   1.1   Alkaline Phos 38 - 126 U/L 62   56   AST 15 - 41 U/L 57   53   ALT 0 - 44 U/L 44   36     Lab Results  Component Value Date   WBC 11.4 (H) 04/22/2023   HGB 12.3 04/22/2023   HCT 35.8 (L) 04/22/2023   MCV 97.5 04/22/2023   PLT 104 (L) 04/22/2023   NEUTROABS 6.7 04/22/2023    ASSESSMENT & PLAN:  No problem-specific Assessment & Plan notes found for this encounter.    No orders of the defined types were placed in this encounter.  The patient has a good understanding of the overall plan. she agrees with it. she will call with any problems that may develop before the next visit here. Total time spent: 30 mins including face to face time and time spent for planning, charting and co-ordination of  care   Sherlyn Lick, CMA 05/09/23    I Janan Ridge am acting as a Neurosurgeon for [employer]  ***

## 2023-05-12 ENCOUNTER — Inpatient Hospital Stay: Payer: 59

## 2023-05-12 ENCOUNTER — Inpatient Hospital Stay: Payer: 59 | Attending: Hematology and Oncology

## 2023-05-12 ENCOUNTER — Inpatient Hospital Stay (HOSPITAL_BASED_OUTPATIENT_CLINIC_OR_DEPARTMENT_OTHER): Payer: 59 | Admitting: Hematology and Oncology

## 2023-05-12 ENCOUNTER — Other Ambulatory Visit: Payer: Self-pay

## 2023-05-12 VITALS — BP 138/78 | HR 73 | Temp 97.7°F | Resp 18 | Ht 68.0 in | Wt 172.4 lb

## 2023-05-12 DIAGNOSIS — Z170 Estrogen receptor positive status [ER+]: Secondary | ICD-10-CM

## 2023-05-12 DIAGNOSIS — Z9012 Acquired absence of left breast and nipple: Secondary | ICD-10-CM | POA: Diagnosis not present

## 2023-05-12 DIAGNOSIS — Z79810 Long term (current) use of selective estrogen receptor modulators (SERMs): Secondary | ICD-10-CM | POA: Diagnosis not present

## 2023-05-12 DIAGNOSIS — C50012 Malignant neoplasm of nipple and areola, left female breast: Secondary | ICD-10-CM

## 2023-05-12 DIAGNOSIS — Z5111 Encounter for antineoplastic chemotherapy: Secondary | ICD-10-CM | POA: Insufficient documentation

## 2023-05-12 DIAGNOSIS — D696 Thrombocytopenia, unspecified: Secondary | ICD-10-CM | POA: Insufficient documentation

## 2023-05-12 LAB — CBC WITH DIFFERENTIAL (CANCER CENTER ONLY)
Abs Immature Granulocytes: 0.01 10*3/uL (ref 0.00–0.07)
Basophils Absolute: 0.1 10*3/uL (ref 0.0–0.1)
Basophils Relative: 1 %
Eosinophils Absolute: 0.2 10*3/uL (ref 0.0–0.5)
Eosinophils Relative: 4 %
HCT: 36.8 % (ref 36.0–46.0)
Hemoglobin: 12.5 g/dL (ref 12.0–15.0)
Immature Granulocytes: 0 %
Lymphocytes Relative: 45 %
Lymphs Abs: 2.4 10*3/uL (ref 0.7–4.0)
MCH: 32.9 pg (ref 26.0–34.0)
MCHC: 34 g/dL (ref 30.0–36.0)
MCV: 96.8 fL (ref 80.0–100.0)
Monocytes Absolute: 0.5 10*3/uL (ref 0.1–1.0)
Monocytes Relative: 10 %
Neutro Abs: 2.2 10*3/uL (ref 1.7–7.7)
Neutrophils Relative %: 40 %
Platelet Count: 141 10*3/uL — ABNORMAL LOW (ref 150–400)
RBC: 3.8 MIL/uL — ABNORMAL LOW (ref 3.87–5.11)
RDW: 14.1 % (ref 11.5–15.5)
WBC Count: 5.4 10*3/uL (ref 4.0–10.5)
nRBC: 0 % (ref 0.0–0.2)

## 2023-05-12 LAB — CMP (CANCER CENTER ONLY)
ALT: 29 U/L (ref 0–44)
AST: 48 U/L — ABNORMAL HIGH (ref 15–41)
Albumin: 3.6 g/dL (ref 3.5–5.0)
Alkaline Phosphatase: 67 U/L (ref 38–126)
Anion gap: 6 (ref 5–15)
BUN: 14 mg/dL (ref 6–20)
CO2: 28 mmol/L (ref 22–32)
Calcium: 8.9 mg/dL (ref 8.9–10.3)
Chloride: 104 mmol/L (ref 98–111)
Creatinine: 0.75 mg/dL (ref 0.44–1.00)
GFR, Estimated: >60 mL/min (ref >60)
Glucose, Bld: 94 mg/dL (ref 70–99)
Potassium: 3.7 mmol/L (ref 3.5–5.1)
Sodium: 138 mmol/L (ref 135–145)
Total Bilirubin: 1.6 mg/dL — ABNORMAL HIGH (ref 0.3–1.2)
Total Protein: 6.5 g/dL (ref 6.5–8.1)

## 2023-05-12 MED ORDER — SODIUM CHLORIDE 0.9 % IV SOLN
3.0000 mg/kg | Freq: Once | INTRAVENOUS | Status: AC
  Administered 2023-05-12: 200 mg via INTRAVENOUS
  Filled 2023-05-12: qty 10

## 2023-05-12 MED ORDER — SODIUM CHLORIDE 0.9% FLUSH
10.0000 mL | INTRAVENOUS | Status: DC | PRN
  Administered 2023-05-12: 10 mL

## 2023-05-12 MED ORDER — SODIUM CHLORIDE 0.9 % IV SOLN: Freq: Once | INTRAVENOUS | Status: AC

## 2023-05-12 MED ORDER — ACETAMINOPHEN 325 MG PO TABS
650.0000 mg | ORAL_TABLET | Freq: Once | ORAL | Status: AC
  Administered 2023-05-12: 650 mg via ORAL
  Filled 2023-05-12: qty 2

## 2023-05-12 MED ORDER — HEPARIN SOD (PORK) LOCK FLUSH 100 UNIT/ML IV SOLN
500.0000 [IU] | Freq: Once | INTRAVENOUS | Status: AC | PRN
  Administered 2023-05-12: 500 [IU]

## 2023-05-12 MED ORDER — CETIRIZINE HCL 10 MG PO TABS
10.0000 mg | ORAL_TABLET | Freq: Once | ORAL | Status: AC
  Administered 2023-05-12: 10 mg via ORAL
  Filled 2023-05-12: qty 1

## 2023-05-12 MED ORDER — ONDANSETRON HCL 8 MG PO TABS
8.0000 mg | ORAL_TABLET | Freq: Once | ORAL | Status: AC
  Administered 2023-05-12: 8 mg via ORAL
  Filled 2023-05-12: qty 1

## 2023-05-12 NOTE — Assessment & Plan Note (Signed)
03/04/2022 History of subpectoral implants, palpable left breast mass by ultrasound 1.4 cm.  Multiple additional masses 1.1 and 0.9 cm.  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  Breast MRI showed at least 7 different masses largest 1.6 cm.  No lymphadenopathy   Treatment plan: 1. Neoadjuvant chemotherapy with TCH  6 cycles completed 07/01/2022 followed byKadcyla maintenance started 09/02/2022-06/02/2023 2. left mastectomy with sentinel lymph node study: 08/13/2022 ([facility]): Multifocal grade 2 IDC 8 mm, 5 mm, 6 mm, 0/4 lymph nodes, ER/PR positive HER2 positive, reconstruction 08/21/2022 3. Followed by adjuvant antiestrogen therapy -------------------------------------------------------------------------------------------------------------------------------------------- Current treatment: Kadcyla cycle 12   Kadcyla toxicities: Mild intermittent peripheral neuropathy monitoring Fatigue and nausea after each treatment: I reduced the dose of Kadcyla with cycle 7 Thrombocytopenia: Monitoring closely.  If the platelets drop below 100 we may reduce the dosage again.   Tamoxifen toxicities: (On hold) Severe joint stiffness especially in the shoulders: Tamoxifen was discontinued and symptoms improved. Swelling of the hands: Started off when she started tamoxifen. We will try to resume tamoxifen after Daiva Huge is completed and may be we will start at a lower dosage and titrate upwards.   Resumption of menstrual cycles: Patient had very heavy bleeding and was evaluated by Dr. Hyacinth Meeker who referred the patient to interventional radiology for consideration for embolization.

## 2023-05-13 ENCOUNTER — Telehealth: Payer: Self-pay

## 2023-05-13 NOTE — Telephone Encounter (Signed)
Per md orders entered for [employer]. Requisition and all supported documents faxed to [phone]. Faxed confirmation was received.  

## 2023-05-13 NOTE — Addendum Note (Signed)
Addended by: Sherlyn Lick on: 05/13/2023 09:26 AM   Modules accepted: Orders

## 2023-05-20 ENCOUNTER — Encounter: Payer: Self-pay | Admitting: Hematology and Oncology

## 2023-05-20 ENCOUNTER — Telehealth: Payer: Self-pay

## 2023-05-20 ENCOUNTER — Other Ambulatory Visit: Payer: Self-pay

## 2023-05-20 MED ORDER — AZITHROMYCIN 250 MG PO TABS: ORAL_TABLET | ORAL | 0 refills | Status: DC

## 2023-05-20 NOTE — Telephone Encounter (Signed)
Returned Pt's call regarding sick symptoms. Pt states she has congestion/cough/back pain x4 days. Pt states she is now febrile to 101.4 with two negative covid tests. Pt denies dehydration or difficulty breathing. Relayed to MD who recommended Z-PAK rx and to continue to monitor. Advised Pt that she is always welcome for an appt if she would like to be seen. Pt verbalized understanding.

## 2023-05-26 ENCOUNTER — Other Ambulatory Visit: Payer: Self-pay

## 2023-05-31 NOTE — Progress Notes (Signed)
Patient Care Team: Jerene Bears, MD as PCP - General (Obstetrics and Gynecology) Maisie Fus, MD as PCP - Cardiology (Cardiology) Pershing Proud, RN as Oncology Nurse Navigator Donnelly Angelica, RN as Oncology Nurse Navigator Serena Croissant, MD as Consulting Physician (Hematology and Oncology) Emelia Loron, MD as Consulting Physician (General Surgery)  DIAGNOSIS: No diagnosis found.  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  Multiple additional masses 1.1 and 0.9 cm.  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  Breast MRI showed at least 7 different masses largest 1.6 cm.  No lymphadenopathy   03/04/2022 Cancer Staging   Staging form: Breast, AJCC 8th Edition - Clinical stage from 03/04/2022: Stage IA (cT1c, cN0, cM0, G2, ER+, PR+, HER2+) - Signed by Loa Socks, NP on 11/13/2022 Stage prefix: Initial diagnosis Histologic grading system: 3 grade system   03/17/2022 Genetic Testing   Negative genetic testing through the CancerNext performed at [facility] office.  The report date is Mar 17, 2022.  The CancerNext gene panel offered by [employer] includes sequencing and rearrangement analysis for the following 34 genes:   APC, ATM, BARD1, BMPR1A, BRCA1, BRCA2, BRIP1, CDH1, CDK4, CDKN2A, CHEK2, DICER1, HOXB13, EPCAM, GREM1, MLH1, MRE11A, MSH2, MSH6, MUTYH, NBN, NF1, PALB2, PMS2, POLD1, POLE, PTEN, RAD50, RAD51C, RAD51D, SMAD4, SMARCA4, STK11, and TP53.     03/18/2022 - 08/12/2022 Chemotherapy   Patient is on Treatment Plan : BREAST Docetaxel + Carboplatin + Trastuzumab (TCH) q21d / Trastuzumab q21d     08/13/2022 Surgery   Left mastectomy with sentinel lymph node study: 08/13/2022 ([facility]):  Multifocal grade 2 IDC 8 mm, 5 mm, 6 mm, 0/4 lymph nodes, ER/PR positive HER2 positive, reconstruction 08/21/2022    08/13/2022 Cancer Staging   Staging form: Breast, AJCC 8th Edition - Pathologic stage from 08/13/2022: No Stage Recommended (ypT1b, pN0, cM0, G2, ER+, PR+, HER2+) - Signed by Loa Socks, NP on 11/13/2022 Stage prefix: Post-therapy Histologic grading system: 3 grade system   09/02/2022 -  Chemotherapy   Patient is on Treatment Plan : BREAST ADO-Trastuzumab Emtansine (Kadcyla) q21d     10/2022 -  Anti-estrogen oral therapy   Tamoxifen daily     CHIEF COMPLIANT: Follow-up Kadcyla cycle 13   INTERVAL HISTORY: [name] is a 42 y.o with left breast estrogen receptor positive Currently on antiestrogen therapy with tamoxifen and treatment with Kadcyla. She presents to the clinic for a follow-up.    ALLERGIES:  is allergic to sulfamethoxazole-trimethoprim and wound dressing adhesive.  MEDICATIONS:  Current Outpatient Medications  Medication Sig Dispense Refill   ALPRAZolam (XANAX) 0.25 MG tablet Take 0.25 mg by mouth.     azithromycin (ZITHROMAX) 250 MG tablet Take as directed 6 each 0   fluconazole (DIFLUCAN) 150 MG tablet Take 150 mg by mouth every 3 (three) days.     HYDROcodone-acetaminophen (NORCO/VICODIN) 5-325 MG tablet Take 1 tablet by mouth every 4 (four) hours as needed for up to 30 doses for moderate pain. 30 tablet 0   hydrOXYzine (ATARAX) 10 MG tablet Take 1 tablet (10 mg total) by mouth 3 (three) times daily as needed. 30 tablet 0   ipratropium (ATROVENT) 0.03 % nasal spray Place 2 sprays into both nostrils 2 (two) times daily.  lidocaine-prilocaine (EMLA) cream Apply to affected area once 30 g 3   prochlorperazine (COMPAZINE) 10 MG tablet Take 1 tablet (10 mg total) by mouth every 6 (six) hours as needed for nausea or vomiting. 30 tablet 3   promethazine (PHENERGAN) 25 MG tablet Take 1 tablet (25 mg total) by mouth every 8 (eight) hours as  needed for up to 20 days for nausea or vomiting. 30 tablet 0   tranexamic acid (LYSTEDA) 650 MG TABS tablet Take 2 tablets (1,300 mg total) by mouth 3 (three) times daily. 30 tablet 1   VEOZAH 45 MG TABS TAKE 1 TABLET BY MOUTH EVERY DAY 30 tablet 2   No current facility-administered medications for this visit.    PHYSICAL EXAMINATION: ECOG PERFORMANCE STATUS: {CHL ONC ECOG [phone]}  There were no vitals filed for this visit. There were no vitals filed for this visit.  BREAST:*** No palpable masses or nodules in either right or left breasts. No palpable axillary supraclavicular or infraclavicular adenopathy no breast tenderness or nipple discharge. (exam performed in the presence of a chaperone)  LABORATORY DATA:  I have reviewed the data as listed    Latest Ref Rng & Units 05/12/2023    8:28 AM 04/22/2023    1:26 PM 04/20/2023    8:00 AM  CMP  Glucose 70 - 99 mg/dL 94  308  657   BUN 6 - 20 mg/dL 14  19  13    Creatinine 0.44 - 1.00 mg/dL 8.46  9.62  9.52   Sodium 135 - 145 mmol/L 138  141  136   Potassium 3.5 - 5.1 mmol/L 3.7  3.4  3.5   Chloride 98 - 111 mmol/L 104  105  103   CO2 22 - 32 mmol/L 28  29  23    Calcium 8.9 - 10.3 mg/dL 8.9  8.9  9.0   Total Protein 6.5 - 8.1 g/dL 6.5  7.2    Total Bilirubin 0.3 - 1.2 mg/dL 1.6  1.0    Alkaline Phos 38 - 126 U/L 67  62    AST 15 - 41 U/L 48  57    ALT 0 - 44 U/L 29  44      Lab Results  Component Value Date   WBC 5.4 05/12/2023   HGB 12.5 05/12/2023   HCT 36.8 05/12/2023   MCV 96.8 05/12/2023   PLT 141 (L) 05/12/2023   NEUTROABS 2.2 05/12/2023    ASSESSMENT & PLAN:  No problem-specific Assessment & Plan notes found for this encounter.    No orders of the defined types were placed in this encounter.  The patient has a good understanding of the overall plan. she agrees with it. she will call with any problems that may develop before the next visit here. Total time spent: 30 mins including face to face time and  time spent for planning, charting and co-ordination of care   Sherlyn Lick, CMA 05/31/23    I Janan Ridge am acting as a Neurosurgeon for [employer]  ***

## 2023-06-02 ENCOUNTER — Inpatient Hospital Stay (HOSPITAL_BASED_OUTPATIENT_CLINIC_OR_DEPARTMENT_OTHER): Payer: 59 | Admitting: Hematology and Oncology

## 2023-06-02 ENCOUNTER — Inpatient Hospital Stay: Payer: 59

## 2023-06-02 ENCOUNTER — Other Ambulatory Visit: Payer: Self-pay

## 2023-06-02 ENCOUNTER — Encounter: Payer: Self-pay | Admitting: *Deleted

## 2023-06-02 VITALS — BP 132/87 | HR 63 | Temp 97.5°F | Resp 18 | Ht 68.0 in | Wt 171.4 lb

## 2023-06-02 DIAGNOSIS — Z95828 Presence of other vascular implants and grafts: Secondary | ICD-10-CM

## 2023-06-02 DIAGNOSIS — C50012 Malignant neoplasm of nipple and areola, left female breast: Secondary | ICD-10-CM | POA: Diagnosis not present

## 2023-06-02 DIAGNOSIS — Z170 Estrogen receptor positive status [ER+]: Secondary | ICD-10-CM

## 2023-06-02 DIAGNOSIS — Z5111 Encounter for antineoplastic chemotherapy: Secondary | ICD-10-CM | POA: Diagnosis not present

## 2023-06-02 LAB — CBC WITH DIFFERENTIAL (CANCER CENTER ONLY)
Abs Immature Granulocytes: 0.01 10*3/uL (ref 0.00–0.07)
Basophils Absolute: 0.1 10*3/uL (ref 0.0–0.1)
Basophils Relative: 1 %
Eosinophils Absolute: 0.2 10*3/uL (ref 0.0–0.5)
Eosinophils Relative: 4 %
HCT: 36.8 % (ref 36.0–46.0)
Hemoglobin: 13 g/dL (ref 12.0–15.0)
Immature Granulocytes: 0 %
Lymphocytes Relative: 44 %
Lymphs Abs: 2.2 10*3/uL (ref 0.7–4.0)
MCH: 33.8 pg (ref 26.0–34.0)
MCHC: 35.3 g/dL (ref 30.0–36.0)
MCV: 95.6 fL (ref 80.0–100.0)
Monocytes Absolute: 0.4 10*3/uL (ref 0.1–1.0)
Monocytes Relative: 7 %
Neutro Abs: 2.3 10*3/uL (ref 1.7–7.7)
Neutrophils Relative %: 44 %
Platelet Count: 129 10*3/uL — ABNORMAL LOW (ref 150–400)
RBC: 3.85 MIL/uL — ABNORMAL LOW (ref 3.87–5.11)
RDW: 13.8 % (ref 11.5–15.5)
WBC Count: 5.1 10*3/uL (ref 4.0–10.5)
nRBC: 0 % (ref 0.0–0.2)

## 2023-06-02 LAB — CMP (CANCER CENTER ONLY)
ALT: 58 U/L — ABNORMAL HIGH (ref 0–44)
AST: 83 U/L — ABNORMAL HIGH (ref 15–41)
Albumin: 3.9 g/dL (ref 3.5–5.0)
Alkaline Phosphatase: 67 U/L (ref 38–126)
Anion gap: 6 (ref 5–15)
BUN: 11 mg/dL (ref 6–20)
CO2: 27 mmol/L (ref 22–32)
Calcium: 9.5 mg/dL (ref 8.9–10.3)
Chloride: 105 mmol/L (ref 98–111)
Creatinine: 0.76 mg/dL (ref 0.44–1.00)
GFR, Estimated: >60 mL/min (ref >60)
Glucose, Bld: 92 mg/dL (ref 70–99)
Potassium: 3.5 mmol/L (ref 3.5–5.1)
Sodium: 138 mmol/L (ref 135–145)
Total Bilirubin: 1.5 mg/dL — ABNORMAL HIGH (ref 0.3–1.2)
Total Protein: 7.1 g/dL (ref 6.5–8.1)

## 2023-06-02 MED ORDER — ACETAMINOPHEN 325 MG PO TABS
650.0000 mg | ORAL_TABLET | Freq: Once | ORAL | Status: AC
  Administered 2023-06-02: 650 mg via ORAL
  Filled 2023-06-02: qty 2

## 2023-06-02 MED ORDER — SODIUM CHLORIDE 0.9 % IV SOLN
3.0000 mg/kg | Freq: Once | INTRAVENOUS | Status: AC
  Administered 2023-06-02: 200 mg via INTRAVENOUS
  Filled 2023-06-02: qty 10

## 2023-06-02 MED ORDER — TAMOXIFEN CITRATE 10 MG PO TABS: 10.0000 mg | ORAL_TABLET | Freq: Every day | ORAL | 3 refills | Status: DC

## 2023-06-02 MED ORDER — SODIUM CHLORIDE 0.9 % IV SOLN: Freq: Once | INTRAVENOUS | Status: AC

## 2023-06-02 MED ORDER — SODIUM CHLORIDE 0.9% FLUSH
10.0000 mL | Freq: Once | INTRAVENOUS | Status: AC
  Administered 2023-06-02: 10 mL

## 2023-06-02 MED ORDER — SODIUM CHLORIDE 0.9% FLUSH
10.0000 mL | INTRAVENOUS | Status: DC | PRN
  Administered 2023-06-02: 10 mL

## 2023-06-02 MED ORDER — HEPARIN SOD (PORK) LOCK FLUSH 100 UNIT/ML IV SOLN
500.0000 [IU] | Freq: Once | INTRAVENOUS | Status: AC | PRN
  Administered 2023-06-02: 500 [IU]

## 2023-06-02 MED ORDER — ONDANSETRON HCL 8 MG PO TABS
8.0000 mg | ORAL_TABLET | Freq: Once | ORAL | Status: AC
  Administered 2023-06-02: 8 mg via ORAL
  Filled 2023-06-02: qty 1

## 2023-06-02 MED ORDER — CETIRIZINE HCL 10 MG PO TABS
10.0000 mg | ORAL_TABLET | Freq: Once | ORAL | Status: AC
  Administered 2023-06-02: 10 mg via ORAL
  Filled 2023-06-02: qty 1

## 2023-06-02 NOTE — Progress Notes (Signed)
Per Dr Pamelia Hoit, ok to tx with AST 83.

## 2023-06-02 NOTE — Assessment & Plan Note (Signed)
03/04/2022 History of subpectoral implants, palpable left breast mass by ultrasound 1.4 cm.  Multiple additional masses 1.1 and 0.9 cm.  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  Breast MRI showed at least 7 different masses largest 1.6 cm.  No lymphadenopathy   Treatment plan: 1. Neoadjuvant chemotherapy with TCH  6 cycles completed 07/01/2022 followed byKadcyla maintenance started 09/02/2022-06/02/2023 2. left mastectomy with sentinel lymph node study: 08/13/2022 ([facility]): Multifocal grade 2 IDC 8 mm, 5 mm, 6 mm, 0/4 lymph nodes, ER/PR positive HER2 positive, reconstruction 08/21/2022 3. Followed by adjuvant antiestrogen therapy -------------------------------------------------------------------------------------------------------------------------------------------- Current treatment: Kadcyla cycle 13 (last cycle)   Kadcyla toxicities: Mild intermittent peripheral neuropathy monitoring Fatigue and nausea after each treatment: I reduced the dose of Kadcyla with cycle 7 Thrombocytopenia: Monitoring closely.  If the platelets drop below 100 we may reduce the dosage again.   Tamoxifen toxicities: (On hold) Severe joint stiffness especially in the shoulders: Tamoxifen was discontinued and symptoms improved. Swelling of the hands: Started off when she started tamoxifen.  Will try to restart tamoxifen at 10 mg a day   Resumption of menstrual cycles:S/P embolization.    Return to clinic in 3 months for survivorship care plan visit

## 2023-06-02 NOTE — Progress Notes (Signed)
Patient seen by Dr. Florene Glen reviewed: AST 83  Per physician team, patient is ready for treatment and there are NO modifications to the treatment plan.

## 2023-06-02 NOTE — Patient Instructions (Signed)
[facility]  Discharge Instructions: Thank you for choosing [facility] to provide your oncology and hematology care.   If you have a lab appointment with the Cancer Center, please go directly to the Cancer Center and check in at the registration area.   Wear comfortable clothing and clothing appropriate for easy access to any Portacath or PICC line.   We strive to give you quality time with your provider. You may need to reschedule your appointment if you arrive late (15 or more minutes).  Arriving late affects you and other patients whose appointments are after yours.  Also, if you miss three or more appointments without notifying the office, you may be dismissed from the clinic at the provider's discretion.      For prescription refill requests, have your pharmacy contact our office and allow 72 hours for refills to be completed.    Today you received the following chemotherapy and/or immunotherapy agents: Kadcyla.       To help prevent nausea and vomiting after your treatment, we encourage you to take your nausea medication as directed.  BELOW ARE SYMPTOMS THAT SHOULD BE REPORTED IMMEDIATELY: *FEVER GREATER THAN 100.4 F (38 C) OR HIGHER *CHILLS OR SWEATING *NAUSEA AND VOMITING THAT IS NOT CONTROLLED WITH YOUR NAUSEA MEDICATION *UNUSUAL SHORTNESS OF BREATH *UNUSUAL BRUISING OR BLEEDING *URINARY PROBLEMS (pain or burning when urinating, or frequent urination) *BOWEL PROBLEMS (unusual diarrhea, constipation, pain near the anus) TENDERNESS IN MOUTH AND THROAT WITH OR WITHOUT PRESENCE OF ULCERS (sore throat, sores in mouth, or a toothache) UNUSUAL RASH, SWELLING OR PAIN  UNUSUAL VAGINAL DISCHARGE OR ITCHING   Items with * indicate a potential emergency and should be followed up as soon as possible or go to the Emergency Department if any problems should occur.  Please show the CHEMOTHERAPY ALERT CARD or IMMUNOTHERAPY ALERT CARD at  check-in to the Emergency Department and triage nurse.  Should you have questions after your visit or need to cancel or reschedule your appointment, please contact [facility]  Dept: [phone]  and follow the prompts.  Office hours are 8:00 a.m. to 4:30 p.m. Monday - Friday. Please note that voicemails left after 4:00 p.m. may not be returned until the following business day.  We are closed weekends and major holidays. You have access to a nurse at all times for urgent questions. Please call the main number to the clinic Dept: [phone] and follow the prompts.   For any non-urgent questions, you may also contact your provider using MyChart. We now offer e-Visits for anyone 18 and older to request care online for non-urgent symptoms. For details visit [email or web address].   Also download the MyChart app! Go to the app store, search "MyChart", open the app, select [facility], and log in with your MyChart username and password.   

## 2023-06-03 ENCOUNTER — Telehealth: Payer: Self-pay | Admitting: Hematology and Oncology

## 2023-06-03 NOTE — Telephone Encounter (Signed)
Scheduled appointment per 7/23 los. Patient is aware of the made appointment.

## 2023-06-15 ENCOUNTER — Ambulatory Visit
Admission: RE | Admit: 2023-06-15 | Discharge: 2023-06-15 | Disposition: A | Discharge status: Home / Self Care | Payer: 59 | Source: Ambulatory Visit | Attending: Radiology | Admitting: Radiology

## 2023-06-15 ENCOUNTER — Ambulatory Visit (HOSPITAL_COMMUNITY): Payer: 59 | Attending: Internal Medicine

## 2023-06-15 DIAGNOSIS — I361 Nonrheumatic tricuspid (valve) insufficiency: Secondary | ICD-10-CM | POA: Diagnosis not present

## 2023-06-15 DIAGNOSIS — I427 Cardiomyopathy due to drug and external agent: Secondary | ICD-10-CM | POA: Diagnosis not present

## 2023-06-15 DIAGNOSIS — D259 Leiomyoma of uterus, unspecified: Secondary | ICD-10-CM

## 2023-06-15 HISTORY — PX: IR RADIOLOGIST EVAL & MGMT: IMG5224

## 2023-06-15 LAB — ECHOCARDIOGRAM LIMITED
Area-P 1/2: 3.73 cm2
S' Lateral: 3.1 cm
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
## 2023-06-17 ENCOUNTER — Other Ambulatory Visit: Payer: Self-pay

## 2023-06-17 DIAGNOSIS — Z170 Estrogen receptor positive status [ER+]: Secondary | ICD-10-CM

## 2023-06-17 DIAGNOSIS — Z79899 Other long term (current) drug therapy: Secondary | ICD-10-CM

## 2023-06-24 ENCOUNTER — Telehealth: Payer: Self-pay

## 2023-06-24 NOTE — Telephone Encounter (Signed)
Called pt per MD to advise [facility] testing was negative/not detected. Pt verbalized understanding of results and knows [facility] will be in touch to schedule 3 mo repeat lab.   

## 2023-06-29 ENCOUNTER — Ambulatory Visit: Payer: 59 | Attending: Hematology and Oncology

## 2023-06-29 VITALS — Wt 175.0 lb

## 2023-06-29 DIAGNOSIS — Z483 Aftercare following surgery for neoplasm: Secondary | ICD-10-CM | POA: Insufficient documentation
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
## 2023-08-26 ENCOUNTER — Encounter: Payer: Self-pay | Admitting: Hematology and Oncology

## 2023-08-27 LAB — SIGNATERA
SIGNATERA MTM READOUT: 0 MTM/ml
SIGNATERA TEST RESULT: NEGATIVE

## 2023-09-02 ENCOUNTER — Inpatient Hospital Stay: Payer: 59 | Attending: Hematology and Oncology | Admitting: Adult Health

## 2023-09-02 ENCOUNTER — Encounter: Payer: Self-pay | Admitting: Hematology and Oncology

## 2023-09-02 ENCOUNTER — Encounter: Payer: Self-pay | Admitting: Adult Health

## 2023-09-02 ENCOUNTER — Other Ambulatory Visit: Payer: Self-pay

## 2023-09-02 VITALS — BP 122/70 | HR 61 | Temp 97.4°F | Resp 18 | Ht 68.0 in | Wt 176.4 lb

## 2023-09-02 DIAGNOSIS — Z79810 Long term (current) use of selective estrogen receptor modulators (SERMs): Secondary | ICD-10-CM | POA: Diagnosis not present

## 2023-09-02 DIAGNOSIS — Z803 Family history of malignant neoplasm of breast: Secondary | ICD-10-CM | POA: Insufficient documentation

## 2023-09-02 DIAGNOSIS — Z9221 Personal history of antineoplastic chemotherapy: Secondary | ICD-10-CM | POA: Insufficient documentation

## 2023-09-02 DIAGNOSIS — C50112 Malignant neoplasm of central portion of left female breast: Secondary | ICD-10-CM | POA: Diagnosis present

## 2023-09-02 DIAGNOSIS — Z1731 Human epidermal growth factor receptor 2 positive status: Secondary | ICD-10-CM | POA: Diagnosis not present

## 2023-09-02 DIAGNOSIS — Z1721 Progesterone receptor positive status: Secondary | ICD-10-CM | POA: Diagnosis not present

## 2023-09-02 DIAGNOSIS — Z170 Estrogen receptor positive status [ER+]: Secondary | ICD-10-CM | POA: Diagnosis not present

## 2023-09-02 DIAGNOSIS — Z9013 Acquired absence of bilateral breasts and nipples: Secondary | ICD-10-CM | POA: Insufficient documentation

## 2023-09-02 DIAGNOSIS — C50012 Malignant neoplasm of nipple and areola, left female breast: Secondary | ICD-10-CM | POA: Diagnosis not present
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
## 2023-10-05 ENCOUNTER — Ambulatory Visit: Payer: 59 | Attending: Hematology and Oncology

## 2023-10-05 VITALS — Wt 173.5 lb

## 2023-10-05 DIAGNOSIS — Z483 Aftercare following surgery for neoplasm: Secondary | ICD-10-CM | POA: Insufficient documentation
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
## 2023-10-22 ENCOUNTER — Ambulatory Visit (HOSPITAL_BASED_OUTPATIENT_CLINIC_OR_DEPARTMENT_OTHER): Payer: 59 | Admitting: Obstetrics & Gynecology

## 2023-10-28 ENCOUNTER — Ambulatory Visit (HOSPITAL_BASED_OUTPATIENT_CLINIC_OR_DEPARTMENT_OTHER): Payer: 59 | Admitting: Obstetrics & Gynecology

## 2023-10-28 ENCOUNTER — Other Ambulatory Visit (HOSPITAL_COMMUNITY)
Admission: RE | Admit: 2023-10-28 | Discharge: 2023-10-28 | Disposition: A | Discharge status: Home / Self Care | Payer: 59 | Source: Ambulatory Visit | Attending: Obstetrics & Gynecology | Admitting: Obstetrics & Gynecology

## 2023-10-28 ENCOUNTER — Encounter (HOSPITAL_BASED_OUTPATIENT_CLINIC_OR_DEPARTMENT_OTHER): Payer: Self-pay | Admitting: Obstetrics & Gynecology

## 2023-10-28 VITALS — BP 133/77 | HR 63 | Ht 68.0 in | Wt 176.2 lb

## 2023-10-28 DIAGNOSIS — D251 Intramural leiomyoma of uterus: Secondary | ICD-10-CM | POA: Diagnosis not present

## 2023-10-28 DIAGNOSIS — Z124 Encounter for screening for malignant neoplasm of cervix: Secondary | ICD-10-CM

## 2023-10-28 DIAGNOSIS — D250 Submucous leiomyoma of uterus: Secondary | ICD-10-CM

## 2023-10-28 DIAGNOSIS — B3731 Acute candidiasis of vulva and vagina: Secondary | ICD-10-CM

## 2023-10-28 DIAGNOSIS — C50012 Malignant neoplasm of nipple and areola, left female breast: Secondary | ICD-10-CM

## 2023-10-28 DIAGNOSIS — L231 Allergic contact dermatitis due to adhesives: Secondary | ICD-10-CM

## 2023-10-28 DIAGNOSIS — Z01419 Encounter for gynecological examination (general) (routine) without abnormal findings: Secondary | ICD-10-CM | POA: Diagnosis not present

## 2023-10-28 DIAGNOSIS — Z170 Estrogen receptor positive status [ER+]: Secondary | ICD-10-CM

## 2023-10-28 MED ORDER — FLUCONAZOLE 150 MG PO TABS: ORAL_TABLET | ORAL | 0 refills | Status: DC

## 2023-10-28 MED ORDER — HYDROCORTISONE 2.5 % EX OINT: TOPICAL_OINTMENT | Freq: Two times a day (BID) | CUTANEOUS | 2 refills | Status: DC
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
## 2023-10-29 ENCOUNTER — Ambulatory Visit (HOSPITAL_BASED_OUTPATIENT_CLINIC_OR_DEPARTMENT_OTHER): Payer: 59 | Admitting: Obstetrics & Gynecology

## 2023-10-30 LAB — CYTOLOGY - PAP
Comment: NEGATIVE
Diagnosis: NEGATIVE
High risk HPV: NEGATIVE

## 2023-11-13 ENCOUNTER — Other Ambulatory Visit (HOSPITAL_BASED_OUTPATIENT_CLINIC_OR_DEPARTMENT_OTHER): Payer: Self-pay | Admitting: Obstetrics & Gynecology

## 2023-11-13 ENCOUNTER — Encounter (HOSPITAL_BASED_OUTPATIENT_CLINIC_OR_DEPARTMENT_OTHER): Payer: Self-pay | Admitting: Obstetrics & Gynecology

## 2023-11-13 DIAGNOSIS — R232 Flushing: Secondary | ICD-10-CM

## 2023-11-13 DIAGNOSIS — R748 Abnormal levels of other serum enzymes: Secondary | ICD-10-CM

## 2023-11-16 ENCOUNTER — Other Ambulatory Visit (HOSPITAL_BASED_OUTPATIENT_CLINIC_OR_DEPARTMENT_OTHER): Payer: 59

## 2023-11-16 DIAGNOSIS — R232 Flushing: Secondary | ICD-10-CM

## 2023-11-16 DIAGNOSIS — R748 Abnormal levels of other serum enzymes: Secondary | ICD-10-CM

## 2023-11-17 LAB — COMPREHENSIVE METABOLIC PANEL
ALT: 20 [IU]/L (ref 0–32)
AST: 33 [IU]/L (ref 0–40)
Albumin: 4.4 g/dL (ref 3.9–4.9)
Alkaline Phosphatase: 59 [IU]/L (ref 44–121)
BUN/Creatinine Ratio: 12 (ref 9–23)
BUN: 12 mg/dL (ref 6–24)
Bilirubin Total: 1 mg/dL (ref 0.0–1.2)
CO2: 19 mmol/L — ABNORMAL LOW (ref 20–29)
Calcium: 9.5 mg/dL (ref 8.7–10.2)
Chloride: 102 mmol/L (ref 96–106)
Creatinine, Ser: 0.98 mg/dL (ref 0.57–1.00)
Globulin, Total: 3 g/dL (ref 1.5–4.5)
Glucose: 96 mg/dL (ref 70–99)
Potassium: 4.1 mmol/L (ref 3.5–5.2)
Sodium: 137 mmol/L (ref 134–144)
Total Protein: 7.4 g/dL (ref 6.0–8.5)
eGFR: 73 mL/min/{1.73_m2} (ref >59)

## 2023-11-20 LAB — SIGNATERA
SIGNATERA MTM READOUT: 0 MTM/ml
SIGNATERA TEST RESULT: NEGATIVE

## 2023-11-24 MED ORDER — VEOZAH 45 MG PO TABS: 1.0000 | ORAL_TABLET | Freq: Every day | ORAL | 2 refills | Status: DC

## 2023-11-27 ENCOUNTER — Other Ambulatory Visit: Payer: Self-pay | Admitting: Hematology and Oncology

## 2023-11-30 ENCOUNTER — Other Ambulatory Visit: Payer: Self-pay | Admitting: Hematology and Oncology

## 2023-12-02 ENCOUNTER — Inpatient Hospital Stay: Payer: 59 | Attending: Hematology and Oncology | Admitting: Hematology and Oncology

## 2023-12-02 VITALS — BP 124/72 | HR 59 | Temp 97.4°F | Resp 18 | Ht 68.0 in | Wt 178.1 lb

## 2023-12-02 DIAGNOSIS — Z1731 Human epidermal growth factor receptor 2 positive status: Secondary | ICD-10-CM | POA: Diagnosis not present

## 2023-12-02 DIAGNOSIS — R232 Flushing: Secondary | ICD-10-CM | POA: Diagnosis not present

## 2023-12-02 DIAGNOSIS — Z1721 Progesterone receptor positive status: Secondary | ICD-10-CM | POA: Insufficient documentation

## 2023-12-02 DIAGNOSIS — Z9012 Acquired absence of left breast and nipple: Secondary | ICD-10-CM | POA: Insufficient documentation

## 2023-12-02 DIAGNOSIS — C50012 Malignant neoplasm of nipple and areola, left female breast: Secondary | ICD-10-CM | POA: Insufficient documentation

## 2023-12-02 DIAGNOSIS — Z170 Estrogen receptor positive status [ER+]: Secondary | ICD-10-CM | POA: Insufficient documentation

## 2023-12-02 DIAGNOSIS — Z9221 Personal history of antineoplastic chemotherapy: Secondary | ICD-10-CM | POA: Diagnosis not present

## 2023-12-02 DIAGNOSIS — Z79810 Long term (current) use of selective estrogen receptor modulators (SERMs): Secondary | ICD-10-CM | POA: Insufficient documentation

## 2023-12-02 NOTE — Assessment & Plan Note (Signed)
03/04/2022 History of subpectoral implants, palpable left breast mass by ultrasound 1.4 cm.  Multiple additional masses 1.1 and 0.9 cm.  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  Breast MRI showed at least 7 different masses largest 1.6 cm.  No lymphadenopathy   Treatment plan: 1. Neoadjuvant chemotherapy with TCH  6 cycles completed 07/01/2022 followed byKadcyla maintenance started 09/02/2022-06/02/2023 2. left mastectomy with sentinel lymph node study: 08/13/2022 ([facility]): Multifocal grade 2 IDC 8 mm, 5 mm, 6 mm, 0/4 lymph nodes, ER/PR positive HER2 positive, reconstruction 08/21/2022 3. Followed by adjuvant antiestrogen therapy -------------------------------------------------------------------------------------------------------------------------------------------- Current treatment: Tamoxifen 10 mg daily restarted 07/12/2023   Tamoxifen toxicities: (On hold) Severe joint stiffness especially in the shoulders: Tamoxifen was discontinued and symptoms improved. Swelling of the hands: Started off when she started tamoxifen.      Resumption of menstrual cycles:S/P embolization.    Return to clinic in 1 year for follow-up

## 2023-12-02 NOTE — Progress Notes (Signed)
Patient Care Team: Jerene Bears, MD as PCP - General (Obstetrics and Gynecology) Maisie Fus, MD as PCP - Cardiology (Cardiology) Serena Croissant, MD as Consulting Physician (Hematology and Oncology) Emelia Loron, MD as Consulting Physician (General Surgery)  DIAGNOSIS:  Encounter Diagnosis  Name Primary?   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  Multiple additional masses 1.1 and 0.9 cm.  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  Breast MRI showed at least 7 different masses largest 1.6 cm.  No lymphadenopathy   03/04/2022 Cancer Staging   Staging form: Breast, AJCC 8th Edition - Clinical stage from 03/04/2022: Stage IA (cT1c, cN0, cM0, G2, ER+, PR+, HER2+) - Signed by Loa Socks, NP on 11/13/2022 Stage prefix: Initial diagnosis Histologic grading system: 3 grade system   03/17/2022 Genetic Testing   Negative genetic testing through the CancerNext performed at [facility] office.  The report date is Mar 17, 2022.  The CancerNext gene panel offered by [employer] includes sequencing and rearrangement analysis for the following 34 genes:   APC, ATM, BARD1, BMPR1A, BRCA1, BRCA2, BRIP1, CDH1, CDK4, CDKN2A, CHEK2, DICER1, HOXB13, EPCAM, GREM1, MLH1, MRE11A, MSH2, MSH6, MUTYH, NBN, NF1, PALB2, PMS2, POLD1, POLE, PTEN, RAD50, RAD51C, RAD51D, SMAD4, SMARCA4, STK11, and TP53.     03/18/2022 - 08/12/2022 Chemotherapy   Patient is on Treatment Plan : BREAST Docetaxel + Carboplatin + Trastuzumab (TCH) q21d / Trastuzumab q21d     08/13/2022 Surgery   Left mastectomy with sentinel  lymph node study: 08/13/2022 ([facility]): Multifocal grade 2 IDC 8 mm, 5 mm, 6 mm, 0/4 lymph nodes, ER/PR positive HER2 positive, reconstruction 08/21/2022    08/13/2022 Cancer Staging   Staging form: Breast, AJCC 8th Edition - Pathologic stage from 08/13/2022: No Stage Recommended (ypT1b, pN0, cM0, G2, ER+, PR+, HER2+) - Signed by Loa Socks, NP on 11/13/2022 Stage prefix: Post-therapy Histologic grading system: 3 grade system   09/02/2022 - 06/02/2023 Chemotherapy   Patient is on Treatment Plan : BREAST ADO-Trastuzumab Emtansine (Kadcyla) q21d     10/2022 -  Anti-estrogen oral therapy   Tamoxifen daily     CHIEF COMPLIANT: Follow-up on tamoxifen therapy  HISTORY OF PRESENT ILLNESS:  History of Present Illness   The patient, with a history of breast cancer, has been on tamoxifen 20mg  since September. She initially started on 10mg  but increased the dose after a consultation with another healthcare provider. The patient reports tolerating the medication well, with no significant side effects. She has been supplementing with glucosamine, chondroitin, and bromelain to manage potential joint issues related to tamoxifen use.  The patient's menstrual cycle is regular, and uterine symptoms have improved significantly. However, she has been experiencing hot flashes, a common menopausal symptom. She expresses a desire to restart Veozah, a medication previously used for this issue, but is facing insurance coverage issues.  The patient also mentions a recent minor reconstructive surgery related to her breast cancer treatment. She reports a good recovery and is gradually resuming her regular exercise routine. She also mentions participating in a [facility] program starting in March.  The patient's energy levels and cognitive function are reported  as good. She expresses satisfaction with her current health status and treatment progress.         ALLERGIES:  is allergic to sulfamethoxazole-trimethoprim  and wound dressing adhesive.  MEDICATIONS:  Current Outpatient Medications  Medication Sig Dispense Refill   Fezolinetant (VEOZAH) 45 MG TABS Take 1 tablet (45 mg total) by mouth daily in the afternoon. 30 tablet 2   fluconazole (DIFLUCAN) 150 MG tablet Take 1 tablet orally x 1 and repeat in 72 hours is needed 2 tablet 0   hydrocortisone 2.5 % ointment Apply topically 2 (two) times daily. 28.35 g 2   hydrOXYzine (ATARAX) 10 MG tablet Take 1 tablet (10 mg total) by mouth 3 (three) times daily as needed. (Patient not taking: Reported on 10/28/2023) 30 tablet 0   tamoxifen (NOLVADEX) 20 MG tablet TAKE 1 TABLET BY MOUTH EVERY DAY 90 tablet 3   No current facility-administered medications for this visit.    PHYSICAL EXAMINATION: ECOG PERFORMANCE STATUS: 1 - Symptomatic but completely ambulatory  Vitals:   12/02/23 0952  BP: 124/72  Pulse: (!) 59  Resp: 18  Temp: (!) 97.4 F (36.3 C)  SpO2: 100%   Filed Weights   12/02/23 0952  Weight: 178 lb 1.6 oz (80.8 kg)    Physical Exam          (exam performed in the presence of a chaperone)  LABORATORY DATA:  I have reviewed the data as listed    Latest Ref Rng & Units 11/16/2023   11:31 AM 06/02/2023    8:39 AM 05/12/2023    8:28 AM  CMP  Glucose 70 - 99 mg/dL 96  92  94   BUN 6 - 24 mg/dL 12  11  14    Creatinine 0.57 - 1.00 mg/dL 1.61  0.96  0.45   Sodium 134 - 144 mmol/L 137  138  138   Potassium 3.5 - 5.2 mmol/L 4.1  3.5  3.7   Chloride 96 - 106 mmol/L 102  105  104   CO2 20 - 29 mmol/L 19  27  28    Calcium 8.7 - 10.2 mg/dL 9.5  9.5  8.9   Total Protein 6.0 - 8.5 g/dL 7.4  7.1  6.5   Total Bilirubin 0.0 - 1.2 mg/dL 1.0  1.5  1.6   Alkaline Phos 44 - 121 IU/L 59  67  67   AST 0 - 40 IU/L 33  83  48   ALT 0 - 32 IU/L 20  58  29     Lab Results  Component Value Date   WBC 5.1 06/02/2023   HGB 13.0 06/02/2023   HCT 36.8 06/02/2023   MCV 95.6 06/02/2023   PLT 129 (L) 06/02/2023   NEUTROABS 2.3 06/02/2023    ASSESSMENT  & PLAN:  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  Multiple additional masses 1.1 and 0.9 cm.  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  Breast MRI showed at least 7 different masses largest 1.6 cm.  No lymphadenopathy   Treatment plan: 1. Neoadjuvant chemotherapy with TCH  6 cycles completed 07/01/2022 followed byKadcyla maintenance started 09/02/2022-06/02/2023 2. left mastectomy with sentinel lymph node study: 08/13/2022 ([facility]): Multifocal grade 2 IDC 8 mm, 5 mm, 6 mm, 0/4 lymph nodes, ER/PR positive HER2 positive, reconstruction 08/21/2022 3. Followed by  adjuvant antiestrogen therapy -------------------------------------------------------------------------------------------------------------------------------------------- Current treatment: Tamoxifen 10 mg daily restarted 07/12/2023   Tamoxifen toxicities: (On hold) Severe joint stiffness especially in the shoulders: Tamoxifen was discontinued and symptoms improved. Swelling of the hands: Started off when she started tamoxifen.      Resumption of menstrual cycles:S/P embolization.    Return to clinic in 1 year for follow-up ------------------------------------- Assessment and Plan    Breast Cancer Patient is on Tamoxifen 20mg  since September and reports no significant side effects. She has been taking glucosamine and chondroitin for joint issues. Menstrual cycle is regular and no abnormal uterine bleeding reported. -Continue Tamoxifen 20mg  daily. -Refill prescription sent to [facility] in [place].  Hot Flashes Patient reports experiencing hot flashes and wishes to restart [facility], but insurance coverage is an issue. -Advise patient to contact insurance company regarding coverage for [employer].  General Health Maintenance Patient reports good energy  levels and regular exercise. She has undergone reconstruction surgery and is recovering well. -Annual follow-up visits recommended. -Consider patient for patient advisory board; provide contact information for interview process.  Follow-up -Check Signature results regularly. -See patient once a year for annual review.          No orders of the defined types were placed in this encounter.  The patient has a good understanding of the overall plan. she agrees with it. she will call with any problems that may develop before the next visit here. Total time spent: 30 mins including face to face time and time spent for planning, charting and co-ordination of care   Tamsen Meek, MD 12/02/23

## 2023-12-04 ENCOUNTER — Ambulatory Visit: Payer: 59 | Admitting: Hematology and Oncology

## 2023-12-04 ENCOUNTER — Ambulatory Visit: Payer: 59 | Admitting: Internal Medicine

## 2023-12-06 ENCOUNTER — Encounter (HOSPITAL_BASED_OUTPATIENT_CLINIC_OR_DEPARTMENT_OTHER): Payer: Self-pay | Admitting: Obstetrics & Gynecology

## 2023-12-08 ENCOUNTER — Other Ambulatory Visit (HOSPITAL_BASED_OUTPATIENT_CLINIC_OR_DEPARTMENT_OTHER): Payer: Self-pay | Admitting: Obstetrics & Gynecology

## 2023-12-08 DIAGNOSIS — R232 Flushing: Secondary | ICD-10-CM

## 2023-12-08 MED ORDER — VEOZAH 45 MG PO TABS: 1.0000 | ORAL_TABLET | Freq: Every day | ORAL | 2 refills | Status: DC

## 2023-12-14 ENCOUNTER — Ambulatory Visit: Payer: 59 | Attending: Internal Medicine | Admitting: Internal Medicine

## 2023-12-14 ENCOUNTER — Encounter: Payer: Self-pay | Admitting: Internal Medicine

## 2023-12-14 VITALS — BP 124/84 | HR 67 | Ht 68.5 in | Wt 178.0 lb

## 2023-12-14 DIAGNOSIS — I427 Cardiomyopathy due to drug and external agent: Secondary | ICD-10-CM | POA: Diagnosis not present
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
## 2023-12-14 NOTE — Patient Instructions (Signed)
Medication Instructions:  No changes  *If you need a refill on your cardiac medications before your next appointment, please call your pharmacy*   Lab Work: None  If you have labs (blood work) drawn today and your tests are completely normal, you will receive your results only by: MyChart Message (if you have MyChart) OR A paper copy in the mail If you have any lab test that is abnormal or we need to change your treatment, we will call you to review the results.   Testing/Procedures: Echo to be scheduled in August 2025 Your physician has requested that you have an echocardiogram. Echocardiography is a painless test that uses sound waves to create images of your heart. It provides your doctor with information about the size and shape of your heart and how well your heart's chambers and valves are working. This procedure takes approximately one hour. There are no restrictions for this procedure. Please do NOT wear cologne, perfume, aftershave, or lotions (deodorant is allowed). Please arrive 15 minutes prior to your appointment time.  Please note: We ask at that you not bring children with you during ultrasound (echo/ vascular) testing. Due to room size and safety concerns, children are not allowed in the ultrasound rooms during exams. Our front office staff cannot provide observation of children in our lobby area while testing is being conducted. An adult accompanying a patient to their appointment will only be allowed in the ultrasound room at the discretion of the ultrasound technician under special circumstances. We apologize for any inconvenience.    Follow-Up: At [facility], you and your health needs are our priority.  As part of our continuing mission to provide you with exceptional heart care, we have created designated Provider Care Teams.  These Care Teams include your primary Cardiologist (physician) and Advanced Practice Providers (APPs -  Physician Assistants and Nurse  Practitioners) who all work together to provide you with the care you need, when you need it.   Your next appointment:   Follow up as needed   Provider:   Carolan Clines   Other Instructions
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
## 2024-01-04 ENCOUNTER — Ambulatory Visit: Payer: 59 | Attending: Hematology and Oncology | Admitting: Physical Therapy

## 2024-01-04 DIAGNOSIS — Z170 Estrogen receptor positive status [ER+]: Secondary | ICD-10-CM | POA: Insufficient documentation

## 2024-01-04 DIAGNOSIS — C50012 Malignant neoplasm of nipple and areola, left female breast: Secondary | ICD-10-CM | POA: Insufficient documentation

## 2024-02-17 LAB — SIGNATERA
SIGNATERA MTM READOUT: 0 MTM/ml
SIGNATERA TEST RESULT: NEGATIVE

## 2024-03-28 ENCOUNTER — Ambulatory Visit: Payer: 59 | Attending: Hematology and Oncology

## 2024-03-28 VITALS — Wt 177.4 lb

## 2024-03-28 DIAGNOSIS — Z483 Aftercare following surgery for neoplasm: Secondary | ICD-10-CM | POA: Insufficient documentation
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
## 2024-04-12 ENCOUNTER — Other Ambulatory Visit: Payer: Self-pay | Admitting: *Deleted

## 2024-04-12 DIAGNOSIS — C50012 Malignant neoplasm of nipple and areola, left female breast: Secondary | ICD-10-CM

## 2024-04-12 NOTE — Progress Notes (Signed)
 Signatera renewal orders placed.

## 2024-04-27 LAB — SIGNATERA
SIGNATERA MTM READOUT: 0 MTM/ml
SIGNATERA TEST RESULT: NEGATIVE

## 2024-06-16 ENCOUNTER — Ambulatory Visit (HOSPITAL_COMMUNITY)
Admission: RE | Admit: 2024-06-16 | Discharge: 2024-06-16 | Disposition: A | Discharge status: Home / Self Care | Payer: 59 | Source: Ambulatory Visit | Attending: Cardiology | Admitting: Cardiology

## 2024-06-16 DIAGNOSIS — Z5181 Encounter for therapeutic drug level monitoring: Secondary | ICD-10-CM | POA: Diagnosis present

## 2024-06-16 DIAGNOSIS — Z79899 Other long term (current) drug therapy: Secondary | ICD-10-CM | POA: Diagnosis present

## 2024-06-16 LAB — ECHOCARDIOGRAM COMPLETE
Area-P 1/2: 3.08 cm2
P 1/2 time: 480 ms
S' Lateral: 3.1 cm

## 2024-06-23 ENCOUNTER — Ambulatory Visit: Payer: Self-pay | Admitting: Cardiology

## 2024-06-30 ENCOUNTER — Other Ambulatory Visit (HOSPITAL_BASED_OUTPATIENT_CLINIC_OR_DEPARTMENT_OTHER): Payer: Self-pay | Admitting: Obstetrics & Gynecology

## 2024-06-30 ENCOUNTER — Encounter (HOSPITAL_BASED_OUTPATIENT_CLINIC_OR_DEPARTMENT_OTHER): Payer: Self-pay | Admitting: Obstetrics & Gynecology

## 2024-06-30 DIAGNOSIS — R232 Flushing: Secondary | ICD-10-CM

## 2024-07-01 NOTE — Telephone Encounter (Signed)
 Patient will need to come in to have labs drawn for Veozah 

## 2024-07-04 ENCOUNTER — Ambulatory Visit: Attending: Hematology and Oncology

## 2024-07-04 ENCOUNTER — Other Ambulatory Visit (HOSPITAL_BASED_OUTPATIENT_CLINIC_OR_DEPARTMENT_OTHER): Payer: Self-pay | Admitting: Obstetrics & Gynecology

## 2024-07-04 VITALS — Wt 173.4 lb

## 2024-07-04 DIAGNOSIS — Z79899 Other long term (current) drug therapy: Secondary | ICD-10-CM

## 2024-07-04 DIAGNOSIS — R232 Flushing: Secondary | ICD-10-CM

## 2024-07-04 DIAGNOSIS — Z483 Aftercare following surgery for neoplasm: Secondary | ICD-10-CM | POA: Insufficient documentation
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
## 2024-07-05 ENCOUNTER — Ambulatory Visit (HOSPITAL_BASED_OUTPATIENT_CLINIC_OR_DEPARTMENT_OTHER): Payer: Self-pay | Admitting: Obstetrics & Gynecology

## 2024-07-05 ENCOUNTER — Other Ambulatory Visit (HOSPITAL_BASED_OUTPATIENT_CLINIC_OR_DEPARTMENT_OTHER): Payer: Self-pay

## 2024-07-05 DIAGNOSIS — R232 Flushing: Secondary | ICD-10-CM

## 2024-07-05 LAB — HEPATIC FUNCTION PANEL
ALT: 21 IU/L (ref 0–32)
AST: 28 IU/L (ref 0–40)
Albumin: 4.4 g/dL (ref 3.9–4.9)
Alkaline Phosphatase: 48 IU/L (ref 44–121)
Bilirubin Total: 0.5 mg/dL (ref 0.0–1.2)
Bilirubin, Direct: 0.19 mg/dL (ref 0.00–0.40)
Total Protein: 6.8 g/dL (ref 6.0–8.5)

## 2024-07-05 MED ORDER — VEOZAH 45 MG PO TABS: 45.0000 mg | ORAL_TABLET | Freq: Every day | ORAL | 0 refills | Status: DC

## 2024-07-05 NOTE — Telephone Encounter (Addendum)
 Hepatic function panel performed 07/04/24. Results returned within normal limits. Rx for Veozah  45 mg take 1 tablet daily #90 0RF sent to [facility]. Routing to Dr.Miller for review of labs as well. Patient reports taking rx since May. Reviewed with Dr.Miller patient will need to return in 3 months for follow up hepatic panel. Lab order placed for future.

## 2024-08-03 ENCOUNTER — Encounter: Payer: Self-pay | Admitting: Hematology and Oncology

## 2024-08-18 ENCOUNTER — Encounter (HOSPITAL_BASED_OUTPATIENT_CLINIC_OR_DEPARTMENT_OTHER): Payer: Self-pay | Admitting: Obstetrics & Gynecology

## 2024-09-19 ENCOUNTER — Encounter: Payer: Self-pay | Admitting: Hematology and Oncology

## 2024-10-14 LAB — SIGNATERA ONLY (NATERA MANAGED)
SIGNATERA MTM READOUT: 0 MTM/ml
SIGNATERA TEST RESULT: NEGATIVE

## 2024-10-18 ENCOUNTER — Encounter (HOSPITAL_BASED_OUTPATIENT_CLINIC_OR_DEPARTMENT_OTHER): Payer: Self-pay | Admitting: Obstetrics & Gynecology

## 2024-10-19 ENCOUNTER — Other Ambulatory Visit (HOSPITAL_BASED_OUTPATIENT_CLINIC_OR_DEPARTMENT_OTHER): Payer: Self-pay

## 2024-10-19 DIAGNOSIS — R232 Flushing: Secondary | ICD-10-CM

## 2024-10-19 MED ORDER — VEOZAH 45 MG PO TABS: 45.0000 mg | ORAL_TABLET | Freq: Every day | ORAL | 2 refills | Status: AC

## 2024-10-27 NOTE — Progress Notes (Addendum)
 "  ANNUAL EXAM Patient name: [name] MRN [id number]  Date of birth: 11/29/1980 Chief Complaint:   Gynecologic Exam  History of Present Illness:   [name] is a 43 y.o. [name] Caucasian female being seen today for a routine annual exam.  Dating some.  Desires STI testing.  No other complaints or concerns at this time.   Having Signatera testing every six months.  Followed by Dr. Gudena every 6 months.    Does not typically have regular menstrual bleeding.  On tamoxifen .    No LMP recorded. Patient is perimenopausal.  Last pap 10/28/2023. Results were: NILM w/ HRHPV negative. H/O abnormal pap: yes Last mammogram: not indicated due to bilateral mastectomy  Last colonoscopy: Family h/o colorectal cancer: no     10/28/2024    9:34 AM 10/28/2023   10:31 AM 10/07/2022   10:11 AM 03/11/2022   10:56 AM 01/13/2022   11:51 AM  Depression screen PHQ 2/9  Decreased Interest 0 0 0 1 0  Down, Depressed, Hopeless 0 0 0 1 0  PHQ - 2 Score 0 0 0 2 0     Review of Systems:   Pertinent items are noted in HPI Denies any urinary or bowel changes.  Denies pelvic pain.   Pertinent History Reviewed:  Reviewed past medical,surgical, social and family history.  Reviewed problem list, medications and allergies. Physical Assessment:   Vitals:   10/28/24 0928  BP: 118/84  Pulse: 69  SpO2: 100%  Weight: 174 lb 12.8 oz (79.3 kg)  Height: 5' 8 (1.727 m)  Body mass index is 26.58 kg/m.        Physical Examination:   General appearance - well appearing, and in no distress  Mental status - alert, oriented to person, place, and time  Psych:  She has a normal mood and affect  Skin - warm and dry, normal color, no suspicious lesions noted  Chest - effort normal, all lung fields clear to auscultation bilaterally  Heart - normal rate and regular rhythm  Neck:  midline trachea, no thyromegaly or nodules  Breasts - breasts appear normal, no suspicious masses, no skin or nipple changes or   axillary nodes, bilateral implants present  Abdomen - soft, nontender, nondistended, no masses or organomegaly  Pelvic - VULVA: normal appearing vulva with no masses, tenderness or lesions   VAGINA: normal appearing vagina with normal color and discharge, no lesions   CERVIX: normal appearing cervix without discharge or lesions, no CMT  Thin prep pap is not indicated today  UTERUS: uterus is felt to be normal size, shape, consistency and nontender   ADNEXA: No adnexal masses or tenderness noted.  Rectal - normal rectal, good sphincter tone, no masses felt.  Extremities:  No swelling or varicosities noted  Chaperone present for exam  No results found for this or any previous visit (from the past 24 hours).  Assessment & Plan:  1. Well woman exam with routine gynecological exam (Primary) - Pap smear neg with neg HR HPV 2024 - Mammogram not indicated due to bilateral mastectomy - Colonoscopy guidelines reviewed.  Referral to GI placed for colonoscopy next year.  - lab work done with PCP, Dr. Jakie - vaccines reviewed/updated  2. Screening examination for STD (sexually transmitted disease) - GC/Chl, trich testing obtained - HIV, Hep B and C, RPR obtained today  3. High risk medication use - hepatic panel obtained today - doesn't need RF for veozah   4. Malignant neoplasm involving both  nipple and areola of left breast in female, estrogen receptor positive (HCC) - followed by Dr. Odean.  On Tamoxifen  20mg .   5. History of cervical dysplasia   Orders Placed This Encounter  Procedures   RPR+HBsAg+HIV   Hepatitis C antibody   Hepatic function panel    Meds: No orders of the defined types were placed in this encounter.   Follow-up: Return in about 1 year (around 10/28/2025).  Ronal GORMAN Pinal, MD 10/28/2024 10:12 AM "

## 2024-10-28 ENCOUNTER — Other Ambulatory Visit (HOSPITAL_COMMUNITY)
Admission: RE | Admit: 2024-10-28 | Discharge: 2024-10-28 | Disposition: A | Discharge status: Home / Self Care | Source: Ambulatory Visit | Attending: Obstetrics & Gynecology | Admitting: Obstetrics & Gynecology

## 2024-10-28 ENCOUNTER — Ambulatory Visit (HOSPITAL_BASED_OUTPATIENT_CLINIC_OR_DEPARTMENT_OTHER): Payer: 59 | Admitting: Obstetrics & Gynecology

## 2024-10-28 VITALS — BP 118/84 | HR 69 | Ht 68.0 in | Wt 174.8 lb

## 2024-10-28 DIAGNOSIS — Z87410 Personal history of cervical dysplasia: Secondary | ICD-10-CM | POA: Diagnosis not present

## 2024-10-28 DIAGNOSIS — Z79810 Long term (current) use of selective estrogen receptor modulators (SERMs): Secondary | ICD-10-CM | POA: Diagnosis not present

## 2024-10-28 DIAGNOSIS — Z1331 Encounter for screening for depression: Secondary | ICD-10-CM | POA: Diagnosis not present

## 2024-10-28 DIAGNOSIS — Z79899 Other long term (current) drug therapy: Secondary | ICD-10-CM | POA: Diagnosis not present

## 2024-10-28 DIAGNOSIS — Z113 Encounter for screening for infections with a predominantly sexual mode of transmission: Secondary | ICD-10-CM | POA: Insufficient documentation

## 2024-10-28 DIAGNOSIS — Z01419 Encounter for gynecological examination (general) (routine) without abnormal findings: Secondary | ICD-10-CM

## 2024-10-28 DIAGNOSIS — Z853 Personal history of malignant neoplasm of breast: Secondary | ICD-10-CM

## 2024-10-28 DIAGNOSIS — Z1211 Encounter for screening for malignant neoplasm of colon: Secondary | ICD-10-CM

## 2024-10-28 DIAGNOSIS — Z170 Estrogen receptor positive status [ER+]: Secondary | ICD-10-CM

## 2024-10-28 DIAGNOSIS — C50012 Malignant neoplasm of nipple and areola, left female breast: Secondary | ICD-10-CM

## 2024-10-29 LAB — HEPATIC FUNCTION PANEL
ALT: 19 IU/L (ref 0–32)
AST: 26 IU/L (ref 0–40)
Albumin: 5.1 g/dL — ABNORMAL HIGH (ref 3.9–4.9)
Alkaline Phosphatase: 51 IU/L (ref 41–116)
Bilirubin Total: 0.6 mg/dL (ref 0.0–1.2)
Bilirubin, Direct: 0.25 mg/dL (ref 0.00–0.40)
Total Protein: 8 g/dL (ref 6.0–8.5)

## 2024-10-29 LAB — RPR+HBSAG+HIV
HIV Screen 4th Generation wRfx: NONREACTIVE
Hepatitis B Surface Ag: NEGATIVE
RPR Ser Ql: NONREACTIVE

## 2024-10-29 LAB — HEPATITIS C ANTIBODY: Hep C Virus Ab: NONREACTIVE

## 2024-10-31 LAB — CERVICOVAGINAL ANCILLARY ONLY
Chlamydia: NEGATIVE
Comment: NEGATIVE
Comment: NEGATIVE
Comment: NORMAL
Neisseria Gonorrhea: NEGATIVE
Trichomonas: NEGATIVE

## 2024-11-01 ENCOUNTER — Ambulatory Visit (HOSPITAL_BASED_OUTPATIENT_CLINIC_OR_DEPARTMENT_OTHER): Payer: Self-pay | Admitting: Obstetrics & Gynecology

## 2024-11-01 DIAGNOSIS — Z79899 Other long term (current) drug therapy: Secondary | ICD-10-CM

## 2024-11-12 ENCOUNTER — Encounter (HOSPITAL_BASED_OUTPATIENT_CLINIC_OR_DEPARTMENT_OTHER): Payer: Self-pay | Admitting: Obstetrics & Gynecology

## 2024-11-13 ENCOUNTER — Other Ambulatory Visit (HOSPITAL_BASED_OUTPATIENT_CLINIC_OR_DEPARTMENT_OTHER): Payer: Self-pay | Admitting: Obstetrics & Gynecology

## 2024-11-13 DIAGNOSIS — B3731 Acute candidiasis of vulva and vagina: Secondary | ICD-10-CM

## 2024-11-13 MED ORDER — FLUCONAZOLE 150 MG PO TABS: ORAL_TABLET | ORAL | 0 refills | Status: DC

## 2024-11-17 ENCOUNTER — Encounter: Payer: Self-pay | Admitting: Pediatrics

## 2024-11-25 ENCOUNTER — Other Ambulatory Visit: Payer: Self-pay | Admitting: Hematology and Oncology

## 2024-12-02 ENCOUNTER — Other Ambulatory Visit (HOSPITAL_BASED_OUTPATIENT_CLINIC_OR_DEPARTMENT_OTHER): Payer: Self-pay

## 2024-12-02 DIAGNOSIS — Z79899 Other long term (current) drug therapy: Secondary | ICD-10-CM

## 2024-12-02 LAB — HEPATIC FUNCTION PANEL
ALT: 21 IU/L (ref 0–32)
AST: 28 IU/L (ref 0–40)
Albumin: 4.6 g/dL (ref 3.9–4.9)
Alkaline Phosphatase: 47 IU/L (ref 41–116)
Bilirubin Total: 1.1 mg/dL (ref 0.0–1.2)
Bilirubin, Direct: 0.34 mg/dL (ref 0.00–0.40)
Total Protein: 7.3 g/dL (ref 6.0–8.5)

## 2024-12-03 ENCOUNTER — Ambulatory Visit (HOSPITAL_BASED_OUTPATIENT_CLINIC_OR_DEPARTMENT_OTHER): Payer: Self-pay | Admitting: Obstetrics & Gynecology

## 2024-12-03 DIAGNOSIS — Z79899 Other long term (current) drug therapy: Secondary | ICD-10-CM

## 2024-12-05 ENCOUNTER — Ambulatory Visit: Payer: 59 | Admitting: Hematology and Oncology

## 2024-12-09 ENCOUNTER — Other Ambulatory Visit (HOSPITAL_BASED_OUTPATIENT_CLINIC_OR_DEPARTMENT_OTHER): Payer: Self-pay | Admitting: Obstetrics & Gynecology

## 2024-12-09 ENCOUNTER — Encounter (HOSPITAL_BASED_OUTPATIENT_CLINIC_OR_DEPARTMENT_OTHER): Payer: Self-pay | Admitting: Obstetrics & Gynecology

## 2024-12-09 DIAGNOSIS — B3731 Acute candidiasis of vulva and vagina: Secondary | ICD-10-CM

## 2024-12-09 MED ORDER — FLUCONAZOLE 150 MG PO TABS: ORAL_TABLET | ORAL | 0 refills | Status: AC

## 2024-12-13 ENCOUNTER — Encounter: Payer: Self-pay | Admitting: *Deleted

## 2024-12-13 ENCOUNTER — Inpatient Hospital Stay: Admitting: Hematology and Oncology

## 2024-12-13 VITALS — BP 130/88 | HR 73 | Temp 98.0°F | Resp 18 | Ht 68.0 in | Wt 169.4 lb

## 2024-12-13 DIAGNOSIS — Z170 Estrogen receptor positive status [ER+]: Secondary | ICD-10-CM | POA: Diagnosis not present

## 2024-12-13 DIAGNOSIS — C50012 Malignant neoplasm of nipple and areola, left female breast: Secondary | ICD-10-CM

## 2024-12-13 MED ORDER — ALPRAZOLAM 0.25 MG PO TABS: 0.2500 mg | ORAL_TABLET | Freq: Every evening | ORAL | 3 refills | Status: AC | PRN

## 2024-12-13 NOTE — Research (Signed)
 NRG-CC011: COGNITIVE TRAINING FOR CANCER RELATED  COGNITIVE IMPAIRMENT IN BREAST CANCER SURVIVORS: A  MULTI-CENTER RANDOMIZED DOUBLE- BLINDED  CONTROLLED TRIAL  Patient [name] was identified by Dr.Gudena as a potential candidate for the above listed study.  This Clinical Research Nurse met with [name], [id number], on 12/13/24 in a manner and location that ensures patient privacy to discuss participation in the above listed research study.  Patient is Unaccompanied.  A copy of the informed consent document and separate HIPAA Authorization was provided to the patient.  Patient reads, speaks, and understands English.   Patient was provided with the business card of this Nurse and encouraged to contact the research team with any questions.  Approximately 15 minutes were spent with the patient reviewing the informed consent documents and assessment questions.  Reviewed with pt that this is a 10 week computerized cognitive training that helps the study determine the usefulness of computerized cognitive training for cancer related cognitive impairment and to compare different approaches to cognitive training. Pt participated in the NRG-CC011 Screening SCRIPT assessment. Pt scored 14 points on screening assessment #1. This made pt ineligible for the study. Pt was Thanked for her time and consideration. Informed her that if she needed anything else or had any questions she can reach out to us  and or her doctor. She did not have any questions.   Mazie Larsen, RN, BSN Clinical Research Nurse [phone] 12/13/2024

## 2024-12-13 NOTE — Assessment & Plan Note (Signed)
 03/04/2022 History of subpectoral implants, palpable left breast mass by ultrasound 1.4 cm.  Multiple additional masses 1.1 and 0.9 cm.  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  Breast MRI showed at least 7 different masses largest 1.6 cm.  No lymphadenopathy   Treatment plan: 1. Neoadjuvant chemotherapy with TCH  6 cycles completed 07/01/2022 followed byKadcyla maintenance started 09/02/2022-06/02/2023 2. left mastectomy with sentinel lymph node study: 08/13/2022 ([facility]): Multifocal grade 2 IDC 8 mm, 5 mm, 6 mm, 0/4 lymph nodes, ER/PR positive HER2 positive, reconstruction 08/21/2022 (bilateral mastectomies) 3. Followed by adjuvant antiestrogen therapy -------------------------------------------------------------------------------------------------------------------------------------------- Current treatment: Tamoxifen  10 mg daily restarted 07/12/2023   Tamoxifen  toxicities: Severe joint stiffness especially in the shoulders: Tamoxifen  was discontinued and symptoms improved. Swelling of the hands: Started off when she started tamoxifen .  Breast cancer surveillance:  Breast exam 12/13/2024: Benign Mammogram: No role of mammograms since she had bilateral mastectomies Signatera for MRD: 10/14/2024: 0 Resumption of menstrual cycles:S/P embolization.    Return to clinic in 1 year for follow-up

## 2024-12-14 ENCOUNTER — Encounter (HOSPITAL_BASED_OUTPATIENT_CLINIC_OR_DEPARTMENT_OTHER): Payer: Self-pay | Admitting: Obstetrics & Gynecology

## 2024-12-19 ENCOUNTER — Ambulatory Visit

## 2025-01-02 ENCOUNTER — Ambulatory Visit

## 2025-01-20 ENCOUNTER — Encounter

## 2025-01-31 ENCOUNTER — Encounter: Admitting: Pediatrics

## 2025-12-13 ENCOUNTER — Inpatient Hospital Stay: Admitting: Hematology and Oncology
